# Patient Record
Sex: Female | Born: 1960 | Hispanic: No | Marital: Single | Smoking: Never smoker
Health system: Southern US, Community
[De-identification: ages and names within clinical notes are randomized; demographics above are authoritative.]

## PROBLEM LIST (undated history)

## (undated) DIAGNOSIS — E119 Type 2 diabetes mellitus without complications: Secondary | ICD-10-CM

## (undated) DIAGNOSIS — I10 Essential (primary) hypertension: Secondary | ICD-10-CM

## (undated) DIAGNOSIS — M1990 Unspecified osteoarthritis, unspecified site: Secondary | ICD-10-CM

## (undated) DIAGNOSIS — M797 Fibromyalgia: Secondary | ICD-10-CM

## (undated) HISTORY — PX: TONSILLECTOMY: SUR1361

## (undated) HISTORY — PX: MYOMECTOMY: SHX85

---

## 1998-10-31 HISTORY — PX: ABDOMINAL HYSTERECTOMY: SHX81

## 1999-08-23 ENCOUNTER — Other Ambulatory Visit: Admission: RE | Admit: 1999-08-23 | Discharge: 1999-08-23 | Payer: Self-pay | Admitting: *Deleted

## 1999-08-27 ENCOUNTER — Ambulatory Visit (HOSPITAL_COMMUNITY): Admission: RE | Admit: 1999-08-27 | Discharge: 1999-08-27 | Payer: Self-pay | Admitting: *Deleted

## 1999-08-27 ENCOUNTER — Encounter: Payer: Self-pay | Admitting: *Deleted

## 1999-09-01 ENCOUNTER — Ambulatory Visit: Admission: RE | Admit: 1999-09-01 | Discharge: 1999-09-01 | Payer: Self-pay | Admitting: Gynecology

## 1999-09-02 ENCOUNTER — Encounter: Payer: Self-pay | Admitting: *Deleted

## 1999-09-07 ENCOUNTER — Inpatient Hospital Stay (HOSPITAL_COMMUNITY): Admission: RE | Admit: 1999-09-07 | Discharge: 1999-09-11 | Payer: Self-pay | Admitting: *Deleted

## 1999-09-07 ENCOUNTER — Encounter (INDEPENDENT_AMBULATORY_CARE_PROVIDER_SITE_OTHER): Payer: Self-pay

## 2000-04-14 ENCOUNTER — Encounter: Admission: RE | Admit: 2000-04-14 | Discharge: 2000-04-14 | Payer: Self-pay | Admitting: Orthopedic Surgery

## 2000-04-14 ENCOUNTER — Encounter: Payer: Self-pay | Admitting: Orthopedic Surgery

## 2000-09-19 ENCOUNTER — Other Ambulatory Visit: Admission: RE | Admit: 2000-09-19 | Discharge: 2000-09-19 | Payer: Self-pay | Admitting: *Deleted

## 2000-10-03 ENCOUNTER — Encounter: Admission: RE | Admit: 2000-10-03 | Discharge: 2000-10-03 | Payer: Self-pay | Admitting: *Deleted

## 2000-10-03 ENCOUNTER — Encounter: Payer: Self-pay | Admitting: *Deleted

## 2000-12-25 ENCOUNTER — Encounter: Payer: Self-pay | Admitting: Gastroenterology

## 2000-12-25 ENCOUNTER — Encounter: Admission: RE | Admit: 2000-12-25 | Discharge: 2000-12-25 | Payer: Self-pay | Admitting: Gastroenterology

## 2001-11-06 ENCOUNTER — Encounter: Admission: RE | Admit: 2001-11-06 | Discharge: 2001-11-06 | Payer: Self-pay | Admitting: *Deleted

## 2001-11-06 ENCOUNTER — Encounter: Payer: Self-pay | Admitting: *Deleted

## 2003-11-04 ENCOUNTER — Ambulatory Visit (HOSPITAL_COMMUNITY): Admission: RE | Admit: 2003-11-04 | Discharge: 2003-11-04 | Payer: Self-pay | Admitting: Obstetrics & Gynecology

## 2005-01-12 ENCOUNTER — Ambulatory Visit: Payer: Self-pay | Admitting: Family Medicine

## 2005-01-25 ENCOUNTER — Other Ambulatory Visit: Admission: RE | Admit: 2005-01-25 | Discharge: 2005-01-25 | Payer: Self-pay | Admitting: Obstetrics and Gynecology

## 2005-02-15 ENCOUNTER — Ambulatory Visit (HOSPITAL_COMMUNITY): Admission: RE | Admit: 2005-02-15 | Discharge: 2005-02-15 | Payer: Self-pay | Admitting: Obstetrics and Gynecology

## 2005-04-26 ENCOUNTER — Ambulatory Visit: Payer: Self-pay | Admitting: Family Medicine

## 2005-07-28 ENCOUNTER — Ambulatory Visit: Payer: Self-pay | Admitting: Family Medicine

## 2005-08-23 ENCOUNTER — Encounter: Admission: RE | Admit: 2005-08-23 | Discharge: 2005-08-23 | Payer: Self-pay | Admitting: Family Medicine

## 2005-09-07 ENCOUNTER — Ambulatory Visit: Payer: Self-pay | Admitting: Family Medicine

## 2005-10-18 ENCOUNTER — Ambulatory Visit: Payer: Self-pay | Admitting: Family Medicine

## 2005-10-18 ENCOUNTER — Encounter: Admission: RE | Admit: 2005-10-18 | Discharge: 2005-10-18 | Payer: Self-pay | Admitting: Family Medicine

## 2006-01-31 ENCOUNTER — Other Ambulatory Visit: Admission: RE | Admit: 2006-01-31 | Discharge: 2006-01-31 | Payer: Self-pay | Admitting: Obstetrics and Gynecology

## 2006-02-16 ENCOUNTER — Ambulatory Visit (HOSPITAL_COMMUNITY): Admission: RE | Admit: 2006-02-16 | Discharge: 2006-02-16 | Payer: Self-pay | Admitting: Obstetrics and Gynecology

## 2006-02-28 ENCOUNTER — Ambulatory Visit: Payer: Self-pay | Admitting: Family Medicine

## 2007-03-14 ENCOUNTER — Telehealth: Payer: Self-pay | Admitting: Family Medicine

## 2007-03-14 DIAGNOSIS — R1011 Right upper quadrant pain: Secondary | ICD-10-CM | POA: Insufficient documentation

## 2007-03-16 ENCOUNTER — Encounter: Admission: RE | Admit: 2007-03-16 | Discharge: 2007-03-16 | Payer: Self-pay | Admitting: Family Medicine

## 2007-11-06 ENCOUNTER — Encounter: Payer: Self-pay | Admitting: Family Medicine

## 2008-03-27 ENCOUNTER — Encounter: Payer: Self-pay | Admitting: Family Medicine

## 2008-11-05 ENCOUNTER — Encounter: Payer: Self-pay | Admitting: Family Medicine

## 2009-03-18 ENCOUNTER — Encounter: Payer: Self-pay | Admitting: Family Medicine

## 2009-09-16 ENCOUNTER — Encounter: Payer: Self-pay | Admitting: Family Medicine

## 2009-10-19 ENCOUNTER — Encounter: Admission: RE | Admit: 2009-10-19 | Discharge: 2009-10-19 | Payer: Self-pay | Admitting: Orthopedic Surgery

## 2010-11-30 NOTE — Consult Note (Signed)
Summary: [facility]/Consultation Report/Dr. Corliss Skains  [facility]/Consultation Report/Dr. Corliss Skains   Imported By: Mickle Asper 04/03/2008 12:18:27  _____________________________________________________________________  External Attachment:    Type:   Image     Comment:   External Document

## 2014-11-12 NOTE — H&P (Signed)
CHIEF COMPLAINT:  Painful left knee.    HISTORY OF PRESENT ILLNESS:  Marie [name] is a very pleasant 54 year old female who is seen today for evaluation of her left knee.  She has been treated for her osteoarthritis by Dr. Corliss Skains and Melina Copa, PA.  She has continued to have pain and discomfort in the knee to the point where she has pain with every step, as well as nighttime pain.  She does have some swelling at times.  She is getting some catching and locking and giving way symptoms.  She has been tried on anti-inflammatories of Celebrex, which has not helped her knees, but certainly has been helpful to her pain.  She has had corticosteroid injections, which are now becoming refractory.  She has also had viscosupplementation, which unfortunately has only been helpful for about 3-4 months.  She is now to the point where she is having difficulty with ambulation and her activities of daily living.  She comes in today for evaluation of the left knee.     CURRENT MEDICATIONS:   1.  Tramadol 50 mg 1-2 per day. 2.  Celebrex 200 mg daily. 3.  Losartan/hydrochlorothiazide 100/25, 0.5 tablet daily.   4.  Simvastatin 40 mg at bedtime. 5.  Estradiol 1 mg daily. 6.  Lidoderm patch 5% p.r.n. 7.  Fish oil 2 per day. 8.  Aspirin 81 mg daily. 9.  Victoza injection 18 mg and 3 mL once daily.   ALLERGIES:  NO KNOWN DRUG ALLERGIES.    SURGICAL HISTORY:   1.  Fibroid cystectomy in 1988 from the uterus, she has had 3 surgical procedures for this.   2.  Hysterectomy with salpingo-oophorectomy.  She also had childbirth in 1985.   REVIEW OF SYSTEMS:  A 14-point is negative except for glasses.  She also has had hypertension for 2-3 years and is on Losartan/hydrochlorothiazide.  Diabetes.  She stated she that she is a borderline diabetic.  She is on Victoza and what they are trying to do is also do this for weight loss.   FAMILY HISTORY:  Positive for a mother who just died of leukemia at age 62.  She has had bone  cancer previously.  History of hypertension, diabetes.  Father who is 33 years old and alive, but does have hypertension, diabetes.  She has 1 brother who is healthy at age 22.  No sisters.   SOCIAL HISTORY:   She is a 54 year old female, [place], who is married.  She works from [employer].  She denies the use of tobacco or alcohol.     PHYSICAL EXAMINATION:  Today reveals a 54 year old [place], well-developed, well-nourished, alert, pleasant, cooperative, in moderate distress secondary to left knee pain.  Height is 67.75 inches, weight 294 pounds, BMI 45.  Temperature is 98.6, pulse 76, respiration is 16, blood pressure 126/76.  Head is normocephalic.  Eyes:  Pupils equal, round, reactive to light and accommodation with extraocular movement intact.  Ears, nose and throat were benign.  Neck was supple, no bruits were noted.  Chest had good expansion.  Lungs were essentially clear.  Cardiac had regular rhythm and rate with distant heart sounds.  No murmur is noted.  Pulses were 1+ bilateral and symmetric in lower extremity.  Abdomen with scaphoid, obese, soft, nontender, no masses palpable.  Normal bowel sounds present.  Genital, rectal, and breast exam not indicated for an orthopedic evaluation.  Musculoskeletal:  She has range of motion from approximately 3 degrees to 105 degrees.  She  does have some pseudolaxity with varus and valgus stressing.  She does have patellofemoral crepitus with range of motion, as well as tibiofemoral crepitance.  Calf is soft and nontender.  She is neurovascularly intact distally.     RADIOGRAPHS:  X-rays reveal medial compartment narrowing, significantly medially.  She has periarticular spurring more medial than lateral and some translation of the femur medial on the tibia.  She does have possible loose body posterolaterally.  She does have patellofemoral OA.     CLINICAL IMPRESSION:   1.  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   2.  History of  diabetes. 3.  History of hypertension. 4.  Exogenous obesity with BMI of 45.     RECOMMENDATIONS:  At this time we are going to consider possible total joint replacement.  I have reviewed a clearance form from Dr. Clelia CroftShaw who feels that she is a candidate from both a medical and cardiac standpoint for surgical intervention.  Therefore our plan is to proceed with a left total knee arthroplasty.  The procedure, risks, and benefits were fully explained to her in detail using appropriate models.  I have answered all of her questions in detail and gave her what the hospital and postoperative course.  She is totally understanding.  Plan for surgical intervention in the near future.  Oris DroneBrian D. Aleda Granaetrarca, PA-C [facility] [phone]  11/12/2014 4:28 PM

## 2014-11-14 ENCOUNTER — Encounter (HOSPITAL_COMMUNITY): Payer: Self-pay

## 2014-11-14 ENCOUNTER — Ambulatory Visit (HOSPITAL_COMMUNITY)
Admission: RE | Admit: 2014-11-14 | Discharge: 2014-11-14 | Disposition: A | Discharge status: Home / Self Care | Payer: 59 | Source: Ambulatory Visit | Attending: Orthopedic Surgery | Admitting: Orthopedic Surgery

## 2014-11-14 ENCOUNTER — Encounter (HOSPITAL_COMMUNITY)
Admission: RE | Admit: 2014-11-14 | Discharge: 2014-11-14 | Disposition: A | Discharge status: Home / Self Care | Payer: 59 | Source: Ambulatory Visit | Attending: Orthopaedic Surgery | Admitting: Orthopaedic Surgery

## 2014-11-14 ENCOUNTER — Other Ambulatory Visit: Payer: Self-pay

## 2014-11-14 DIAGNOSIS — I10 Essential (primary) hypertension: Secondary | ICD-10-CM | POA: Insufficient documentation

## 2014-11-14 DIAGNOSIS — E119 Type 2 diabetes mellitus without complications: Secondary | ICD-10-CM | POA: Diagnosis not present

## 2014-11-14 DIAGNOSIS — M797 Fibromyalgia: Secondary | ICD-10-CM | POA: Diagnosis not present

## 2014-11-14 DIAGNOSIS — Z01818 Encounter for other preprocedural examination: Secondary | ICD-10-CM | POA: Diagnosis not present

## 2014-11-14 HISTORY — DX: Fibromyalgia: M79.7

## 2014-11-14 HISTORY — DX: Unspecified osteoarthritis, unspecified site: M19.90

## 2014-11-14 HISTORY — DX: Type 2 diabetes mellitus without complications: E11.9

## 2014-11-14 LAB — COMPREHENSIVE METABOLIC PANEL
ALK PHOS: 55 U/L (ref 39–117)
ALT: 14 U/L (ref 0–35)
ANION GAP: 4 — AB (ref 5–15)
AST: 18 U/L (ref 0–37)
Albumin: 3.9 g/dL (ref 3.5–5.2)
BUN: 11 mg/dL (ref 6–23)
CHLORIDE: 107 meq/L (ref 96–112)
CO2: 28 mmol/L (ref 19–32)
Calcium: 9.5 mg/dL (ref 8.4–10.5)
Creatinine, Ser: 0.74 mg/dL (ref 0.50–1.10)
GFR calc Af Amer: >90 mL/min (ref >90)
GFR calc non Af Amer: >90 mL/min (ref >90)
GLUCOSE: 96 mg/dL (ref 70–99)
POTASSIUM: 3.9 mmol/L (ref 3.5–5.1)
SODIUM: 139 mmol/L (ref 135–145)
Total Bilirubin: 0.3 mg/dL (ref 0.3–1.2)
Total Protein: 7 g/dL (ref 6.0–8.3)

## 2014-11-14 LAB — URINALYSIS, ROUTINE W REFLEX MICROSCOPIC
Bilirubin Urine: NEGATIVE
GLUCOSE, UA: NEGATIVE mg/dL
Hgb urine dipstick: NEGATIVE
Ketones, ur: NEGATIVE mg/dL
LEUKOCYTES UA: NEGATIVE
Nitrite: NEGATIVE
Protein, ur: NEGATIVE mg/dL
SPECIFIC GRAVITY, URINE: 1.009 (ref 1.005–1.030)
UROBILINOGEN UA: 1 mg/dL (ref 0.0–1.0)
pH: 7.5 (ref 5.0–8.0)

## 2014-11-14 LAB — PROTIME-INR
INR: 0.96 (ref 0.00–1.49)
Prothrombin Time: 12.9 seconds (ref 11.6–15.2)

## 2014-11-14 LAB — CBC WITH DIFFERENTIAL/PLATELET
BASOS ABS: 0 10*3/uL (ref 0.0–0.1)
Basophils Relative: 1 % (ref 0–1)
Eosinophils Absolute: 0.3 10*3/uL (ref 0.0–0.7)
Eosinophils Relative: 4 % (ref 0–5)
HEMATOCRIT: 41.5 % (ref 36.0–46.0)
Hemoglobin: 13.5 g/dL (ref 12.0–15.0)
Lymphocytes Relative: 41 % (ref 12–46)
Lymphs Abs: 3.6 10*3/uL (ref 0.7–4.0)
MCH: 27.3 pg (ref 26.0–34.0)
MCHC: 32.5 g/dL (ref 30.0–36.0)
MCV: 84 fL (ref 78.0–100.0)
MONO ABS: 0.5 10*3/uL (ref 0.1–1.0)
Monocytes Relative: 5 % (ref 3–12)
NEUTROS PCT: 49 % (ref 43–77)
Neutro Abs: 4.3 10*3/uL (ref 1.7–7.7)
Platelets: 246 10*3/uL (ref 150–400)
RBC: 4.94 MIL/uL (ref 3.87–5.11)
RDW: 13.2 % (ref 11.5–15.5)
WBC: 8.7 10*3/uL (ref 4.0–10.5)

## 2014-11-14 LAB — SURGICAL PCR SCREEN
MRSA, PCR: NEGATIVE
Staphylococcus aureus: NEGATIVE

## 2014-11-14 LAB — TYPE AND SCREEN
ABO/RH(D): AB POS
Antibody Screen: NEGATIVE

## 2014-11-14 LAB — ABO/RH: ABO/RH(D): AB POS

## 2014-11-14 LAB — APTT: aPTT: 27 seconds (ref 24–37)

## 2014-11-14 NOTE — Progress Notes (Signed)
   11/14/14 1553  OBSTRUCTIVE SLEEP APNEA  Have you ever been diagnosed with sleep apnea through a sleep study? No  Do you snore loudly (loud enough to be heard through closed doors)?  0  Do you often feel tired, fatigued, or sleepy during the daytime? 1  Has anyone observed you stop breathing during your sleep? 0  Do you have, or are you being treated for high blood pressure? 1  BMI more than 35 kg/m2? 1  Age over 55 years old? 1  Neck circumference greater than 40 cm/16 inches? 1  Gender: 0  Obstructive Sleep Apnea Score 5  Score 4 or greater  Results sent to PCP

## 2014-11-14 NOTE — Progress Notes (Signed)
Pt. Reports that she hasn't ever been seen by cardiologist, denies ever having an advanced cardiac review.  Unsure of last ekg, where or when.  Pt. Denies changes or concerns in her chest for heart or lungs. Pt. Followed by By Dr. Pincus BadderK. Shaw.  Uses Victoza for weight loss, states she is a borderline diabetic.

## 2014-11-14 NOTE — Pre-Procedure Instructions (Signed)
Marie GrossKathy O [name]  11/14/2014   Your procedure is scheduled on:  11/25/2014  [facility] Ad[facility]   ENTRANCE A at  5:30  AM.  Call this number if you have problems the morning of surgery: [phone]   Remember:   Do not eat food or drink liquids after midnight.  On MONDAY   Take these medicines the morning of surgery with A SIP OF WATER:  NONE   Do not wear jewelry, make-up or nail polish.   Do not wear lotions, powders, or perfumes. You may wear deodorant.   Do not shave 48 hours prior to surgery.    Do not bring valuables to the hospital.  [facility] not [facility]                  for any belongings or valuables.               Contacts, dentures or bridgework may not be worn into surgery.   Leave suitcase in the car. After surgery it may be brought to your room.   For patients admitted to the hospital, discharge time is determined by your                treatment team.               Patients discharged the day of surgery will not be allowed to drive  home.  Name and phone number of your driver: with family  Special Instructions: Special Instructions: [facility] - Preparing for Surgery  Before surgery, you can play an important role.  Because skin is not sterile, your skin needs to be as free of germs as possible.  You can reduce the number of germs on you skin by washing with CHG (chlorahexidine gluconate) soap before surgery.  CHG is an antiseptic cleaner which kills germs and bonds with the skin to continue killing germs even after washing.  Please DO NOT use if you have an allergy to CHG or antibacterial soaps.  If your skin becomes reddened/irritated stop using the CHG and inform your nurse when you arrive at Short Stay.  Do not shave (including legs and underarms) for at least 48 hours prior to the first CHG shower.  You may shave your face.  Please follow these instructions carefully:   1.  Shower with CHG Soap the night before surgery and  the  morning of Surgery.  2.  If you choose to wash your hair, wash your hair first as usual with your  normal shampoo.  3.  After you shampoo, rinse your hair and body thoroughly to remove the  Shampoo.  4.  Use CHG as you would any other liquid soap.  You can apply chg directly to the skin and wash gently with scrungie or a clean washcloth.  5.  Apply the CHG Soap to your body ONLY FROM THE NECK DOWN.    Do not use on open wounds or open sores.  Avoid contact with your eyes, ears, mouth and genitals (private parts).  Wash genitals (private parts)   with your normal soap.  6.  Wash thoroughly, paying special attention to the area where your surgery will be performed.  7.  Thoroughly rinse your body with warm water from the neck down.  8.  DO NOT shower/wash with your normal soap after using and rinsing off   the CHG Soap.  9.  Pat yourself dry with a clean towel.  10.  Wear clean pajamas.            11.  Place clean sheets on your bed the night of your first shower and do not sleep with pets.  Day of Surgery  Do not apply any lotions/deodorants the morning of surgery.  Please wear clean clothes to the hospital/surgery center.   Please read over the following fact sheets that you were given: Pain Booklet, Coughing and Deep Breathing, MRSA Information and Surgical Site Infection Prevention

## 2014-11-14 NOTE — Progress Notes (Signed)
Ph. Tech into see pt. For med. Rec. Profile.

## 2014-11-16 LAB — URINE CULTURE: Colony Count: 50000

## 2014-11-17 NOTE — Progress Notes (Signed)
Anesthesia Chart Review:   Marie [name] is 54 year old female scheduled for L total knee arthroplasty on 11/25/2014 with Dr. Cleophas DunkerWhitfield.   PMH includes: DM. BMI 48.   Preoperative labs reviewed.    Chest x-ray reviewed. No acute abnormalities.   EKG: NSR. Cannot rule out Anterior infarct , age undetermined. No prior tracing for comparison.   Called and spoke with Marie [name]. She denies ever experiencing chest pain. Denies SOB, CP with activity although activity is currently limited by knee pain. Denies having EKG in the past.   If no changes, I anticipate Marie [name] can proceed with surgery as scheduled.   Rica Mastngela Kyros Salzwedel, FNP-BC [facility]Anesthesiology Phone: [phone] 11/18/2014 3:03 PM

## 2014-11-18 ENCOUNTER — Encounter (HOSPITAL_COMMUNITY): Payer: Self-pay

## 2014-11-24 MED ORDER — CHLORHEXIDINE GLUCONATE 4 % EX LIQD
60.0000 mL | Freq: Once | CUTANEOUS | Status: DC
  Filled 2014-11-24: qty 60

## 2014-11-24 MED ORDER — ACETAMINOPHEN 10 MG/ML IV SOLN
1000.0000 mg | Freq: Once | INTRAVENOUS | Status: DC
  Filled 2014-11-24: qty 100

## 2014-11-24 MED ORDER — SODIUM CHLORIDE 0.9 % IV SOLN: INTRAVENOUS | Status: DC

## 2014-11-24 MED ORDER — DEXTROSE 5 % IV SOLN
3.0000 g | INTRAVENOUS | Status: DC
  Filled 2014-11-24: qty 3000

## 2014-11-24 MED ORDER — ACETAMINOPHEN 10 MG/ML IV SOLN
1000.0000 mg | Freq: Four times a day (QID) | INTRAVENOUS | Status: DC
  Administered 2014-11-25: 1000 mg via INTRAVENOUS

## 2014-11-24 MED ORDER — DEXTROSE 5 % IV SOLN
3.0000 g | INTRAVENOUS | Status: AC
  Administered 2014-11-25: 3 g via INTRAVENOUS
  Filled 2014-11-24: qty 3000

## 2014-11-25 ENCOUNTER — Inpatient Hospital Stay (HOSPITAL_COMMUNITY)
Admission: RE | Admit: 2014-11-25 | Discharge: 2014-11-27 | DRG: 470 | Disposition: A | Discharge status: Home Health | Payer: 59 | Source: Ambulatory Visit | Attending: Orthopaedic Surgery | Admitting: Orthopaedic Surgery

## 2014-11-25 ENCOUNTER — Inpatient Hospital Stay (HOSPITAL_COMMUNITY): Payer: 59 | Admitting: Emergency Medicine

## 2014-11-25 ENCOUNTER — Encounter (HOSPITAL_COMMUNITY): Payer: Self-pay | Admitting: Anesthesiology

## 2014-11-25 ENCOUNTER — Encounter (HOSPITAL_COMMUNITY)
Admission: RE | Disposition: A | Discharge status: Home Health | Payer: Self-pay | Source: Ambulatory Visit | Attending: Orthopaedic Surgery

## 2014-11-25 ENCOUNTER — Inpatient Hospital Stay (HOSPITAL_COMMUNITY): Payer: 59 | Admitting: Anesthesiology

## 2014-11-25 DIAGNOSIS — Z8249 Family history of ischemic heart disease and other diseases of the circulatory system: Secondary | ICD-10-CM

## 2014-11-25 DIAGNOSIS — E119 Type 2 diabetes mellitus without complications: Secondary | ICD-10-CM | POA: Diagnosis present

## 2014-11-25 DIAGNOSIS — Z79899 Other long term (current) drug therapy: Secondary | ICD-10-CM | POA: Diagnosis not present

## 2014-11-25 DIAGNOSIS — Z01818 Encounter for other preprocedural examination: Secondary | ICD-10-CM

## 2014-11-25 DIAGNOSIS — M25562 Pain in left knee: Secondary | ICD-10-CM | POA: Diagnosis present

## 2014-11-25 DIAGNOSIS — I10 Essential (primary) hypertension: Secondary | ICD-10-CM | POA: Diagnosis present

## 2014-11-25 DIAGNOSIS — M797 Fibromyalgia: Secondary | ICD-10-CM | POA: Diagnosis present

## 2014-11-25 DIAGNOSIS — Z7901 Long term (current) use of anticoagulants: Secondary | ICD-10-CM

## 2014-11-25 DIAGNOSIS — E1169 Type 2 diabetes mellitus with other specified complication: Secondary | ICD-10-CM

## 2014-11-25 DIAGNOSIS — Z96659 Presence of unspecified artificial knee joint: Secondary | ICD-10-CM

## 2014-11-25 DIAGNOSIS — E669 Obesity, unspecified: Secondary | ICD-10-CM

## 2014-11-25 DIAGNOSIS — Z833 Family history of diabetes mellitus: Secondary | ICD-10-CM | POA: Diagnosis not present

## 2014-11-25 DIAGNOSIS — Z806 Family history of leukemia: Secondary | ICD-10-CM

## 2014-11-25 DIAGNOSIS — M1712 Unilateral primary osteoarthritis, left knee: Principal | ICD-10-CM | POA: Diagnosis present

## 2014-11-25 DIAGNOSIS — Z6841 Body Mass Index (BMI) 40.0 and over, adult: Secondary | ICD-10-CM | POA: Diagnosis not present

## 2014-11-25 HISTORY — PX: TOTAL KNEE ARTHROPLASTY: SHX125

## 2014-11-25 HISTORY — DX: Essential (primary) hypertension: I10

## 2014-11-25 LAB — GLUCOSE, CAPILLARY
GLUCOSE-CAPILLARY: 133 mg/dL — AB (ref 70–99)
Glucose-Capillary: 124 mg/dL — ABNORMAL HIGH (ref 70–99)
Glucose-Capillary: 126 mg/dL — ABNORMAL HIGH (ref 70–99)

## 2014-11-25 LAB — HEMOGLOBIN A1C
Hgb A1c MFr Bld: 6.2 % — ABNORMAL HIGH (ref <5.7)
Mean Plasma Glucose: 131 mg/dL — ABNORMAL HIGH (ref <117)

## 2014-11-25 SURGERY — ARTHROPLASTY, KNEE, TOTAL
Anesthesia: General | Site: Knee | Laterality: Left

## 2014-11-25 MED ORDER — ONDANSETRON HCL 4 MG/2ML IJ SOLN
INTRAMUSCULAR | Status: AC
  Filled 2014-11-25: qty 2

## 2014-11-25 MED ORDER — ONDANSETRON HCL 4 MG/2ML IJ SOLN
INTRAMUSCULAR | Status: DC | PRN
  Administered 2014-11-25: 4 mg via INTRAVENOUS

## 2014-11-25 MED ORDER — METHOCARBAMOL 1000 MG/10ML IJ SOLN
500.0000 mg | Freq: Four times a day (QID) | INTRAVENOUS | Status: DC | PRN
  Filled 2014-11-25: qty 5

## 2014-11-25 MED ORDER — ACETAMINOPHEN 10 MG/ML IV SOLN
1000.0000 mg | Freq: Four times a day (QID) | INTRAVENOUS | Status: AC
  Administered 2014-11-25 – 2014-11-26 (×4): 1000 mg via INTRAVENOUS
  Filled 2014-11-25 (×6): qty 100

## 2014-11-25 MED ORDER — VANCOMYCIN HCL 1000 MG IV SOLR
INTRAVENOUS | Status: DC | PRN
  Administered 2014-11-25: 1000 mg

## 2014-11-25 MED ORDER — MENTHOL 3 MG MT LOZG: 1.0000 | LOZENGE | OROMUCOSAL | Status: DC | PRN

## 2014-11-25 MED ORDER — SODIUM CHLORIDE 0.9 % IV SOLN
INTRAVENOUS | Status: DC
  Administered 2014-11-25: 13:00:00 via INTRAVENOUS

## 2014-11-25 MED ORDER — SODIUM CHLORIDE 0.9 % IR SOLN
Status: DC | PRN
  Administered 2014-11-25 (×2): 1000 mL

## 2014-11-25 MED ORDER — HYDROMORPHONE HCL 1 MG/ML IJ SOLN
0.5000 mg | INTRAMUSCULAR | Status: DC | PRN
  Administered 2014-11-25 – 2014-11-27 (×4): 1 mg via INTRAVENOUS
  Filled 2014-11-25 (×4): qty 1

## 2014-11-25 MED ORDER — BUPIVACAINE-EPINEPHRINE 0.25% -1:200000 IJ SOLN
INTRAMUSCULAR | Status: DC | PRN
  Administered 2014-11-25: 30 mL

## 2014-11-25 MED ORDER — INSULIN ASPART 100 UNIT/ML ~~LOC~~ SOLN
0.0000 [IU] | Freq: Three times a day (TID) | SUBCUTANEOUS | Status: DC
  Administered 2014-11-25: 2 [IU] via SUBCUTANEOUS
  Administered 2014-11-26 (×3): 3 [IU] via SUBCUTANEOUS
  Administered 2014-11-27: 2 [IU] via SUBCUTANEOUS

## 2014-11-25 MED ORDER — ESTRADIOL 1 MG PO TABS
1.0000 mg | ORAL_TABLET | Freq: Every day | ORAL | Status: DC
  Administered 2014-11-25 – 2014-11-26 (×2): 1 mg via ORAL
  Filled 2014-11-25 (×3): qty 1

## 2014-11-25 MED ORDER — DEXTROSE 5 % IV SOLN
INTRAVENOUS | Status: DC | PRN
  Administered 2014-11-25: 08:00:00 via INTRAVENOUS

## 2014-11-25 MED ORDER — ONDANSETRON HCL 4 MG/2ML IJ SOLN
4.0000 mg | Freq: Once | INTRAMUSCULAR | Status: AC | PRN
  Administered 2014-11-25: 4 mg via INTRAVENOUS

## 2014-11-25 MED ORDER — LOSARTAN POTASSIUM 50 MG PO TABS
50.0000 mg | ORAL_TABLET | Freq: Every day | ORAL | Status: DC
  Administered 2014-11-25 – 2014-11-26 (×2): 50 mg via ORAL
  Filled 2014-11-25 (×3): qty 1

## 2014-11-25 MED ORDER — METHOCARBAMOL 500 MG PO TABS
500.0000 mg | ORAL_TABLET | Freq: Four times a day (QID) | ORAL | Status: DC | PRN
Start: 2014-11-25 — End: 2014-11-27
  Administered 2014-11-25 – 2014-11-27 (×8): 500 mg via ORAL
  Filled 2014-11-25 (×9): qty 1

## 2014-11-25 MED ORDER — SIMVASTATIN 40 MG PO TABS
40.0000 mg | ORAL_TABLET | Freq: Every day | ORAL | Status: DC
  Administered 2014-11-25 – 2014-11-26 (×2): 40 mg via ORAL
  Filled 2014-11-25 (×4): qty 1

## 2014-11-25 MED ORDER — INSULIN ASPART 100 UNIT/ML ~~LOC~~ SOLN: 0.0000 [IU] | Freq: Every day | SUBCUTANEOUS | Status: DC

## 2014-11-25 MED ORDER — LIDOCAINE HCL (CARDIAC) 20 MG/ML IV SOLN
INTRAVENOUS | Status: AC
  Filled 2014-11-25: qty 5

## 2014-11-25 MED ORDER — BISACODYL 5 MG PO TBEC: 5.0000 mg | DELAYED_RELEASE_TABLET | Freq: Every day | ORAL | Status: DC | PRN

## 2014-11-25 MED ORDER — PHENOL 1.4 % MT LIQD
1.0000 | OROMUCOSAL | Status: DC | PRN
Start: 2014-11-25 — End: 2014-11-27

## 2014-11-25 MED ORDER — LACTATED RINGERS IV SOLN
INTRAVENOUS | Status: DC | PRN
  Administered 2014-11-25 (×2): via INTRAVENOUS

## 2014-11-25 MED ORDER — PROPOFOL 10 MG/ML IV BOLUS
INTRAVENOUS | Status: AC
  Filled 2014-11-25: qty 20

## 2014-11-25 MED ORDER — LOSARTAN POTASSIUM-HCTZ 100-25 MG PO TABS: 0.5000 | ORAL_TABLET | Freq: Every day | ORAL | Status: DC

## 2014-11-25 MED ORDER — DOCUSATE SODIUM 100 MG PO CAPS
100.0000 mg | ORAL_CAPSULE | Freq: Two times a day (BID) | ORAL | Status: DC
  Administered 2014-11-25 – 2014-11-27 (×5): 100 mg via ORAL
  Filled 2014-11-25 (×6): qty 1

## 2014-11-25 MED ORDER — MAGNESIUM CITRATE PO SOLN: 1.0000 | Freq: Once | ORAL | Status: AC | PRN

## 2014-11-25 MED ORDER — LIRAGLUTIDE 18 MG/3ML ~~LOC~~ SOPN: 1.8000 mg | PEN_INJECTOR | Freq: Every day | SUBCUTANEOUS | Status: DC

## 2014-11-25 MED ORDER — MIDAZOLAM HCL 2 MG/2ML IJ SOLN
INTRAMUSCULAR | Status: AC
  Filled 2014-11-25: qty 2

## 2014-11-25 MED ORDER — METHOCARBAMOL 500 MG PO TABS
ORAL_TABLET | ORAL | Status: AC
  Administered 2014-11-25: 500 mg via ORAL
  Filled 2014-11-25: qty 1

## 2014-11-25 MED ORDER — FENTANYL CITRATE 0.05 MG/ML IJ SOLN
INTRAMUSCULAR | Status: DC | PRN
  Administered 2014-11-25 (×5): 25 ug via INTRAVENOUS
  Administered 2014-11-25: 50 ug via INTRAVENOUS
  Administered 2014-11-25: 25 ug via INTRAVENOUS
  Administered 2014-11-25: 50 ug via INTRAVENOUS

## 2014-11-25 MED ORDER — OXYCODONE HCL 5 MG PO TABS
5.0000 mg | ORAL_TABLET | ORAL | Status: DC | PRN
  Administered 2014-11-25 – 2014-11-27 (×14): 10 mg via ORAL
  Filled 2014-11-25 (×13): qty 2

## 2014-11-25 MED ORDER — ONDANSETRON HCL 4 MG/2ML IJ SOLN
INTRAMUSCULAR | Status: AC
  Administered 2014-11-25: 4 mg via INTRAVENOUS
  Filled 2014-11-25: qty 2

## 2014-11-25 MED ORDER — ALUM & MAG HYDROXIDE-SIMETH 200-200-20 MG/5ML PO SUSP: 30.0000 mL | ORAL | Status: DC | PRN

## 2014-11-25 MED ORDER — ONDANSETRON HCL 4 MG PO TABS: 4.0000 mg | ORAL_TABLET | Freq: Four times a day (QID) | ORAL | Status: DC | PRN

## 2014-11-25 MED ORDER — METOCLOPRAMIDE HCL 5 MG/ML IJ SOLN: 5.0000 mg | Freq: Three times a day (TID) | INTRAMUSCULAR | Status: DC | PRN

## 2014-11-25 MED ORDER — MIDAZOLAM HCL 5 MG/5ML IJ SOLN
INTRAMUSCULAR | Status: DC | PRN
  Administered 2014-11-25: 2 mg via INTRAVENOUS

## 2014-11-25 MED ORDER — HYDROMORPHONE HCL 1 MG/ML IJ SOLN
INTRAMUSCULAR | Status: AC
Start: 2014-11-25 — End: 2014-11-25
  Administered 2014-11-25: 0.5 mg via INTRAVENOUS
  Filled 2014-11-25: qty 1

## 2014-11-25 MED ORDER — RIVAROXABAN 10 MG PO TABS
10.0000 mg | ORAL_TABLET | Freq: Every day | ORAL | Status: DC
  Administered 2014-11-26 – 2014-11-27 (×2): 10 mg via ORAL
  Filled 2014-11-25 (×3): qty 1

## 2014-11-25 MED ORDER — HYDROMORPHONE HCL 1 MG/ML IJ SOLN
0.2500 mg | INTRAMUSCULAR | Status: DC | PRN
  Administered 2014-11-25 (×6): 0.5 mg via INTRAVENOUS

## 2014-11-25 MED ORDER — CEFAZOLIN SODIUM-DEXTROSE 2-3 GM-% IV SOLR
2.0000 g | Freq: Four times a day (QID) | INTRAVENOUS | Status: AC
  Administered 2014-11-25 (×2): 2 g via INTRAVENOUS
  Filled 2014-11-25 (×3): qty 50

## 2014-11-25 MED ORDER — OXYCODONE HCL 5 MG PO TABS
ORAL_TABLET | ORAL | Status: AC
  Administered 2014-11-25: 10 mg via ORAL
  Filled 2014-11-25: qty 2

## 2014-11-25 MED ORDER — METOCLOPRAMIDE HCL 10 MG PO TABS: 5.0000 mg | ORAL_TABLET | Freq: Three times a day (TID) | ORAL | Status: DC | PRN

## 2014-11-25 MED ORDER — HYDROMORPHONE HCL 1 MG/ML IJ SOLN
INTRAMUSCULAR | Status: AC
  Administered 2014-11-25: 0.5 mg via INTRAVENOUS
  Filled 2014-11-25: qty 2

## 2014-11-25 MED ORDER — ONDANSETRON HCL 4 MG/2ML IJ SOLN: 4.0000 mg | Freq: Four times a day (QID) | INTRAMUSCULAR | Status: DC | PRN

## 2014-11-25 MED ORDER — KETOROLAC TROMETHAMINE 15 MG/ML IJ SOLN
INTRAMUSCULAR | Status: AC
  Filled 2014-11-25: qty 1

## 2014-11-25 MED ORDER — MEPERIDINE HCL 25 MG/ML IJ SOLN: 6.2500 mg | INTRAMUSCULAR | Status: DC | PRN

## 2014-11-25 MED ORDER — FENTANYL CITRATE 0.05 MG/ML IJ SOLN
INTRAMUSCULAR | Status: AC
  Filled 2014-11-25: qty 5

## 2014-11-25 MED ORDER — KETOROLAC TROMETHAMINE 15 MG/ML IJ SOLN
15.0000 mg | Freq: Four times a day (QID) | INTRAMUSCULAR | Status: AC
  Administered 2014-11-25 – 2014-11-26 (×3): 15 mg via INTRAVENOUS
  Filled 2014-11-25 (×4): qty 1

## 2014-11-25 MED ORDER — LIDOCAINE HCL (CARDIAC) 20 MG/ML IV SOLN
INTRAVENOUS | Status: DC | PRN
  Administered 2014-11-25: 100 mg via INTRAVENOUS

## 2014-11-25 MED ORDER — VANCOMYCIN HCL 1000 MG IV SOLR
INTRAVENOUS | Status: AC
  Filled 2014-11-25: qty 1000

## 2014-11-25 MED ORDER — SENNOSIDES-DOCUSATE SODIUM 8.6-50 MG PO TABS: 1.0000 | ORAL_TABLET | Freq: Every evening | ORAL | Status: DC | PRN

## 2014-11-25 MED ORDER — BUPIVACAINE-EPINEPHRINE (PF) 0.25% -1:200000 IJ SOLN
INTRAMUSCULAR | Status: AC
  Filled 2014-11-25: qty 30

## 2014-11-25 MED ORDER — HYDROCHLOROTHIAZIDE 12.5 MG PO CAPS
12.5000 mg | ORAL_CAPSULE | Freq: Every day | ORAL | Status: DC
  Administered 2014-11-25 – 2014-11-26 (×2): 12.5 mg via ORAL
  Filled 2014-11-25 (×3): qty 1

## 2014-11-25 MED ORDER — PROPOFOL 10 MG/ML IV BOLUS
INTRAVENOUS | Status: DC | PRN
  Administered 2014-11-25: 200 mg via INTRAVENOUS
  Administered 2014-11-25: 20 mg via INTRAVENOUS

## 2014-11-25 MED ORDER — 0.9 % SODIUM CHLORIDE (POUR BTL) OPTIME
TOPICAL | Status: DC | PRN
  Administered 2014-11-25: 1000 mL

## 2014-11-25 MED ORDER — DIPHENHYDRAMINE HCL 12.5 MG/5ML PO ELIX: 12.5000 mg | ORAL_SOLUTION | ORAL | Status: DC | PRN

## 2014-11-25 SURGICAL SUPPLY — 62 items
BANDAGE ESMARK 6X9 LF (GAUZE/BANDAGES/DRESSINGS) ×1 IMPLANT
BLADE SAGITTAL 25.0X1.19X90 (BLADE) ×2 IMPLANT
BNDG CMPR 9X6 STRL LF SNTH (GAUZE/BANDAGES/DRESSINGS) ×1
BNDG ESMARK 6X9 LF (GAUZE/BANDAGES/DRESSINGS) ×2
BOWL SMART MIX CTS (DISPOSABLE) ×2 IMPLANT
CAP KNEE TOTAL 3 SIGMA ×2 IMPLANT
CEMENT HV SMART SET (Cement) ×4 IMPLANT
COVER SURGICAL LIGHT HANDLE (MISCELLANEOUS) ×2 IMPLANT
CUFF TOURNIQUET SINGLE 34IN LL (TOURNIQUET CUFF) ×2 IMPLANT
CUFF TOURNIQUET SINGLE 44IN (TOURNIQUET CUFF) IMPLANT
DRAPE EXTREMITY T 121X128X90 (DRAPE) ×2 IMPLANT
DRAPE IMP U-DRAPE 54X76 (DRAPES) ×2 IMPLANT
DRAPE PROXIMA HALF (DRAPES) ×2 IMPLANT
DRSG ADAPTIC 3X8 NADH LF (GAUZE/BANDAGES/DRESSINGS) ×2 IMPLANT
DRSG PAD ABDOMINAL 8X10 ST (GAUZE/BANDAGES/DRESSINGS) ×2 IMPLANT
DURAPREP 26ML APPLICATOR (WOUND CARE) ×4 IMPLANT
ELECT CAUTERY BLADE 6.4 (BLADE) ×2 IMPLANT
ELECT REM PT RETURN 9FT ADLT (ELECTROSURGICAL) ×2
ELECTRODE REM PT RTRN 9FT ADLT (ELECTROSURGICAL) ×1 IMPLANT
EVACUATOR 1/8 PVC DRAIN (DRAIN) IMPLANT
FACESHIELD WRAPAROUND (MASK) ×4 IMPLANT
FIXATION PINS ×6 IMPLANT
FLOSEAL 10ML (HEMOSTASIS) IMPLANT
GAUZE SPONGE 4X4 12PLY STRL (GAUZE/BANDAGES/DRESSINGS) ×2 IMPLANT
GLOVE BIOGEL PI IND STRL 8 (GLOVE) ×1 IMPLANT
GLOVE BIOGEL PI IND STRL 8.5 (GLOVE) ×1 IMPLANT
GLOVE BIOGEL PI INDICATOR 8 (GLOVE) ×1
GLOVE BIOGEL PI INDICATOR 8.5 (GLOVE) ×1
GLOVE ECLIPSE 8.0 STRL XLNG CF (GLOVE) ×4 IMPLANT
GLOVE SURG ORTHO 8.5 STRL (GLOVE) ×4 IMPLANT
GOWN STRL REUS W/ TWL LRG LVL3 (GOWN DISPOSABLE) ×2 IMPLANT
GOWN STRL REUS W/TWL 2XL LVL3 (GOWN DISPOSABLE) ×2 IMPLANT
GOWN STRL REUS W/TWL LRG LVL3 (GOWN DISPOSABLE) ×4
HANDPIECE INTERPULSE COAX TIP (DISPOSABLE) ×2
KIT BASIN OR (CUSTOM PROCEDURE TRAY) ×2 IMPLANT
KIT ROOM TURNOVER OR (KITS) ×2 IMPLANT
MANIFOLD NEPTUNE II (INSTRUMENTS) ×2 IMPLANT
NEEDLE 22X1 1/2 (OR ONLY) (NEEDLE) IMPLANT
NS IRRIG 1000ML POUR BTL (IV SOLUTION) ×2 IMPLANT
PACK TOTAL JOINT (CUSTOM PROCEDURE TRAY) ×2 IMPLANT
PACK UNIVERSAL I (CUSTOM PROCEDURE TRAY) ×2 IMPLANT
PAD ARMBOARD 7.5X6 YLW CONV (MISCELLANEOUS) ×4 IMPLANT
PAD CAST 4YDX4 CTTN HI CHSV (CAST SUPPLIES) ×1 IMPLANT
PADDING CAST COTTON 4X4 STRL (CAST SUPPLIES) ×2
PADDING CAST COTTON 6X4 STRL (CAST SUPPLIES) ×2 IMPLANT
SET HNDPC FAN SPRY TIP SCT (DISPOSABLE) ×1 IMPLANT
STAPLER VISISTAT 35W (STAPLE) ×2 IMPLANT
SUCTION FRAZIER TIP 10 FR DISP (SUCTIONS) ×2 IMPLANT
SUT BONE WAX W31G (SUTURE) ×2 IMPLANT
SUT ETHIBOND NAB CT1 #1 30IN (SUTURE) ×4 IMPLANT
SUT MNCRL AB 3-0 PS2 18 (SUTURE) ×2 IMPLANT
SUT VIC AB 0 CT1 27 (SUTURE) ×2
SUT VIC AB 0 CT1 27XBRD ANBCTR (SUTURE) ×1 IMPLANT
SUT VIC AB 1 CT1 27 (SUTURE) ×2
SUT VIC AB 1 CT1 27XBRD ANBCTR (SUTURE) ×1 IMPLANT
SYR CONTROL 10ML LL (SYRINGE) ×2 IMPLANT
TOWEL OR 17X24 6PK STRL BLUE (TOWEL DISPOSABLE) ×2 IMPLANT
TOWEL OR 17X26 10 PK STRL BLUE (TOWEL DISPOSABLE) ×2 IMPLANT
TRAY FOLEY CATH 16FRSI W/METER (SET/KITS/TRAYS/PACK) ×1 IMPLANT
UPCHARGE REV TRAY MBT KNEE ×2 IMPLANT
WATER STERILE IRR 1000ML POUR (IV SOLUTION) IMPLANT
WRAP KNEE MAXI GEL POST OP (GAUZE/BANDAGES/DRESSINGS) ×2 IMPLANT

## 2014-11-25 NOTE — Transfer of Care (Signed)
Immediate Anesthesia Transfer of Care Note  Patient: Marie GrossKathy O Foskett  Procedure(s) Performed: Procedure(s): TOTAL KNEE ARTHROPLASTY (Left)  Patient Location: PACU  Anesthesia Type:General  Level of Consciousness: awake and patient cooperative  Airway & Oxygen Therapy: Patient Spontanous Breathing and Patient connected to nasal cannula oxygen  Post-op Assessment: Report given to PACU RN, Post -op Vital signs reviewed and stable and Patient moving all extremities  Post vital signs: Reviewed and stable  Complications: No apparent anesthesia complications

## 2014-11-25 NOTE — Anesthesia Procedure Notes (Signed)
Procedure Name: LMA Insertion Date/Time: 11/25/2014 7:34 AM Performed by: Marni GriffonJAMES, Fredie Majano B Pre-anesthesia Checklist: Emergency Drugs available, Patient identified, Suction available and Patient being monitored Patient Re-evaluated:Patient Re-evaluated prior to inductionOxygen Delivery Method: Circle system utilized Preoxygenation: Pre-oxygenation with 100% oxygen Intubation Type: IV induction Ventilation: Mask ventilation without difficulty LMA: LMA inserted LMA Size: 4.0 Number of attempts: 1 Placement Confirmation: positive ETCO2,  CO2 detector and breath sounds checked- equal and bilateral ETT to lip (cm): taped across cheeks; gauze roll b/t teeth. Tube secured with: Tape Dental Injury: Teeth and Oropharynx as per pre-operative assessment

## 2014-11-25 NOTE — Evaluation (Signed)
Physical Therapy Evaluation Patient Details Name: Marie [name] MRN: [id number] DOB: [birth year] Today's Date: 11/25/2014   History of Present Illness  Pt admitted for left TKA  Clinical Impression  Pt very pleasant and moving well. Pt states she is not extremely active but desires to be able to clean the house and walk without pain. Family present throughout eval and educated for precautions, HEP,PWB, transfers, plan and goals. Pt will benefit from acute therapy to maximize strength, ROM, mobility and gait to decrease burden of care.     Follow Up Recommendations Home health PT;Supervision for mobility/OOB    Equipment Recommendations  Rolling walker with 5" wheels;3in1 (PT) (wide 3in1)    Recommendations for Other Services       Precautions / Restrictions Precautions Precautions: Knee;Fall Restrictions Weight Bearing Restrictions: Yes LLE Weight Bearing: Partial weight bearing LLE Partial Weight Bearing Percentage or Pounds: 50%      Mobility  Bed Mobility Overal bed mobility: Needs Assistance Bed Mobility: Supine to Sit     Supine to sit: Min assist     General bed mobility comments: cues for sequence with assist to move LLE, HOB elevated with use of rail  Transfers Overall transfer level: Needs assistance Equipment used: Rolling walker (2 wheeled) Transfers: Sit to/from Stand Sit to Stand: Min assist         General transfer comment: cues for sequence, hand placement and LLE position  Ambulation/Gait   Ambulation Distance (Feet): 6 Feet Assistive device: Rolling walker (2 wheeled) Gait Pattern/deviations: Step-to pattern   Gait velocity interpretation: Below normal speed for age/gender General Gait Details: cues for sequence and PWB status  Stairs            Wheelchair Mobility    Modified Rankin (Stroke Patients Only)       Balance Overall balance assessment: Needs assistance   Sitting balance-Leahy Scale: Good       Standing  balance-Leahy Scale: Fair                               Pertinent Vitals/Pain Pain Assessment: 0-10 Pain Score: 4  Pain Location: left knee incision Pain Descriptors / Indicators: Aching Pain Intervention(s): Limited activity within patient's tolerance;Repositioned;Ice applied         sats 100% on RA  Home Living Family/patient expects to be discharged to:: Private residence Living Arrangements: Spouse/significant other Available Help at Discharge: Family;Available 24 hours/day Type of Home: House Home Access: Stairs to enter Entrance Stairs-Rails: Right Entrance Stairs-Number of Steps: 3 Home Layout: One level Home Equipment: None      Prior Function Level of Independence: Independent               Hand Dominance        Extremity/Trunk Assessment   Upper Extremity Assessment: Overall WFL for tasks assessed           Lower Extremity Assessment: LLE deficits/detail   LLE Deficits / Details: limited by post op pain  Cervical / Trunk Assessment: Normal  Communication   Communication: No difficulties  Cognition Arousal/Alertness: Awake/alert Behavior During Therapy: WFL for tasks assessed/performed Overall Cognitive Status: Within Functional Limits for tasks assessed                      General Comments      Exercises Total Joint Exercises Ankle Circles/Pumps: AROM;Left;5 reps;Supine Quad Sets: AROM;Left;5 reps;Supine Heel Slides: AAROM;Left;5 reps;Supine  Assessment/Plan    PT Assessment Patient needs continued PT services  PT Diagnosis Difficulty walking;Acute pain   PT Problem List Decreased strength;Decreased range of motion;Decreased activity tolerance;Decreased mobility;Pain;Decreased knowledge of precautions;Decreased knowledge of use of DME  PT Treatment Interventions DME instruction;Gait training;Stair training;Functional mobility training;Therapeutic activities;Therapeutic exercise;Patient/family education   PT  Goals (Current goals can be found in the Care Plan section) Acute Rehab PT Goals Patient Stated Goal: walk without pain PT Goal Formulation: With patient/family Time For Goal Achievement: 12/02/14 Potential to Achieve Goals: Good    Frequency 7X/week   Barriers to discharge        Co-evaluation               End of Session   Activity Tolerance: Patient tolerated treatment well Patient left: in chair;with call bell/phone within reach;with family/visitor present Nurse Communication: Mobility status;Weight bearing status;Precautions         Time: [id number] PT Time Calculation (min) (ACUTE ONLY): 26 min   Charges:   PT Evaluation $Initial PT Evaluation Tier I: 1 Procedure PT Treatments $Therapeutic Activity: 8-22 mins   PT G CodesDelorse Lek:        Tabor, Harlyn Rathmann Beth 11/25/2014, 1:58 PM  Delaney MeigsMaija Tabor Satchel Heidinger, PT [phone]

## 2014-11-25 NOTE — Plan of Care (Signed)
Problem: Consults Goal: Diagnosis- Total Joint Replacement Primary Total Knee Left     

## 2014-11-25 NOTE — Discharge Instructions (Signed)
Information on my medicine - XARELTO® (Rivaroxaban) ° °This medication education was reviewed with me or my healthcare representative as part of my discharge preparation.  The pharmacist that spoke with me during my hospital stay was:  Albion, Tandrea Kommer Danielle, RPH ° °Why was Xarelto® prescribed for you? °Xarelto® was prescribed for you to reduce the risk of blood clots forming after orthopedic surgery. The medical term for these abnormal blood clots is venous thromboembolism (VTE). ° °What do you need to know about xarelto® ? °Take your Xarelto® ONCE DAILY at the same time every day. °You may take it either with or without food. ° °If you have difficulty swallowing the tablet whole, you may crush it and mix in applesauce just prior to taking your dose. ° °Take Xarelto® exactly as prescribed by your doctor and DO NOT stop taking Xarelto® without talking to the doctor who prescribed the medication.  Stopping without other VTE prevention medication to take the place of Xarelto® may increase your risk of developing a clot. ° °After discharge, you should have regular check-up appointments with your healthcare provider that is prescribing your Xarelto®.   ° °What do you do if you miss a dose? °If you miss a dose, take it as soon as you remember on the same day then continue your regularly scheduled once daily regimen the next day. Do not take two doses of Xarelto® on the same day.  ° °Important Safety Information °A possible side effect of Xarelto® is bleeding. You should call your healthcare provider right away if you experience any of the following: °? Bleeding from an injury or your nose that does not stop. °? Unusual colored urine (red or dark brown) or unusual colored stools (red or black). °? Unusual bruising for unknown reasons. °? A serious fall or if you hit your head (even if there is no bleeding). ° °Some medicines may interact with Xarelto® and might increase your risk of bleeding while on Xarelto®. To help  avoid this, consult your healthcare provider or pharmacist prior to using any new prescription or non-prescription medications, including herbals, vitamins, non-steroidal anti-inflammatory drugs (NSAIDs) and supplements. ° °This website has more information on Xarelto®: www.xarelto.com. ° ° °

## 2014-11-25 NOTE — Op Note (Signed)
PATIENT ID:      Jodelle GrossKathy O Mohon  MRN:     [id number] DOB/AGE:    55-18-1962 / 55 y.o.       OPERATIVE REPORT    DATE OF PROCEDURE:  11/25/2014       PREOPERATIVE DIAGNOSIS:   END STAGE OSTEOARTHRITIS LEFT KNEE                                                       Morbid Obesity with BMI 45     POSTOPERATIVE DIAGNOSIS:   END STAGE OSTEOARTHRITIS LEFT KNEE                                                                     SAME     PROCEDURE:  Procedure(s):LEFT TOTAL KNEE ARTHROPLASTY      SURGEON:  Norlene CampbellPeter Uchenna Seufert, MD    ASSISTANT:   Jacqualine CodeBrian Petrarca, PA-C   (Present and scrubbed throughout the case, critical for assistance with exposure, retraction, instrumentation, and closure.)          ANESTHESIA: regional and general     DRAINS: (left knee) Hemovact drain(s) in the clamped with  Suction Clamped :      TOURNIQUET TIME:  Total Tourniquet Time Documented: Thigh (Left) - 67 minutes Total: Thigh (Left) - 67 minutes     COMPLICATIONS:  None   CONDITION:  stable  PROCEDURE IN DETAIL: [id number]: [id number]   Joselynn Amoroso W 11/25/2014, 9:06 AM

## 2014-11-25 NOTE — Progress Notes (Signed)
Utilization review completed.  

## 2014-11-25 NOTE — Progress Notes (Signed)
Orthopedic Tech Progress Note Patient Details:  Marie GrossKathy O [name] 01-07-1961 [id number] CPM applied to LLE with appropriate settings. OHF applied to bed. CPM Left Knee CPM Left Knee: On Left Knee Flexion (Degrees): 90 Left Knee Extension (Degrees): 0   Asia R Thompson 11/25/2014, 10:59 AM

## 2014-11-25 NOTE — Anesthesia Postprocedure Evaluation (Signed)
Anesthesia Post Note  Patient: Marie [name]  Procedure(s) Performed: Procedure(s) (LRB): TOTAL KNEE ARTHROPLASTY (Left)  Anesthesia type: general  Patient location: PACU  Post pain: Pain level controlled  Post assessment: Patient's Cardiovascular Status Stable  Last Vitals:  Filed Vitals:   11/25/14 1200  BP: 120/59  Pulse: 73  Temp: 36.9 C  Resp: 14    Post vital signs: Reviewed and stable  Level of consciousness: sedated  Complications: No apparent anesthesia complications

## 2014-11-25 NOTE — Op Note (Signed)
NAME:  Marie [name], Marie [name]              ACCOUNT NO.:  [id number]  MEDICAL RECORD NO.:  [id number]  LOCATION:  [facility]                         FACILITY:  [facility]  PHYSICIAN:  Peter W. Whitfield, M.D.DATE OF BIRTH:  06/18/1961  DATE OF PROCEDURE:  11/25/2014 DATE OF DISCHARGE:                              OPERATIVE REPORT   PREOPERATIVE DIAGNOSES: 1. End-stage osteoarthritis, left knee. 2. Morbid obesity with a BMI of 45.  POSTOPERATIVE DIAGNOSES: 1. End-stage osteoarthritis, left knee. 2. Morbid obesity with a BMI of 45.  PROCEDURE:  Left total knee replacement.  SURGEON:  Peter W. Whitfield, MD  ASSISTANT:  Brian Petrarca PA-C, who was present throughout the operative procedure to ensure its timely completion.  ANESTHESIA:  General with supplemental femoral nerve block.  COMPLICATIONS:  None.  COMPONENTS:  DePuy LCS standard plus femoral component, a 12.5-mm polyethylene bridging bearing, an MBT size 3 revision tibial rotating platform, 3-Peg metal back rotating patella.  Components were secured with polymethyl methacrylate with added vancomycin.  DESCRIPTION OF PROCEDURE:  Marie [name] was met in the holding area, identified the left knee as appropriate operative site and marked it accordingly.  She did receive a preoperative femoral nerve block per Anesthesia.  Any questions were answered.  The patient was then transported to room #7 and placed under general anesthesia without difficulty.  The nursing staff inserted a Foley catheter.  Urine was clear.  Tourniquet was applied to the left thigh and the left leg was then prepped with chlorhexidine and scrubbed with DuraPrep x2 from the tourniquet to the tips of the toes.  Sterile draping was performed. Time-out was called.  The extremity was elevated and Esmarch exsanguinated with a proximal tourniquet at 350 mmHg.  A midline longitudinal incision was made centered about the patella extending from the tibial tubercle to the  superior pouch via sharp dissection.  Incision was carried down to subcutaneous tissue.  First layer of capsule was incised in midline and medial parapatellar incision was then made with the Bovie.  The joint was entered.  There was a clear yellow joint effusion approximately 25 mL in volume.  The patella was everted 180 degrees laterally, the knee flexed to 90 degrees.  There were large osteophytes along the medial and lateral femoral condyle.  Complete absence of articular cartilage in the medial femoral condyle and great extent to the medial tibial plateau.  There were large osteophytes along the medial tibial plateau.  Osteophytes were removed from the periphery of the femur and tibia.  I measured a standard plus femoral component.  First, a bony cut was then made transversely on the proximal tibia with a 3-degree angle of posterior declination.  Having measured the standard plus femoral component, the standard plus jig was then applied and the bony cuts were then made on the femur.  I used a 4-degree distal femoral valgus cut.  Flexion and extension gaps were perfectly symmetrical at 12.5.  MCL and LCL remained intact.  Laminar spreaders were then placed in the medial and lateral compartment.  I removed medial and lateral menisci as well as ACL and PCL, large osteophytes in the posterior femoral condyles medially and laterally.  There was a   small Baker cyst identified posteromedially. This was debrided, and there were no loose bodies.  I again checked to be sure that the flexion/extension gaps were symmetrical 12.5 mm.  Finishing guide was then applied for tapering purposes on the femur to obtain the center holes.  Retractors were then placed about the tibia, measured a #3 rotating tibial MBT revision tray, that is the reason for the 3-degree of angle of declination, any further osteophytes removed from around the tibia. A center hole was then made.  The MBT revision trial  was then inserted and the wedge cuts were then obtained for stability purposes.  With the tibial jig in place, a 12.5-mm polyethylene trial bridging bearing was inserted followed by the trial standard plus femoral component.  The components were reduced and through a full range of motion, full extension, and well over 105 degrees of flexion, there was no malrotation of the tibia.  I did check my bony cuts with the external guide after each cut.  The patella was prepared by removing approximately 11 mm of bone leaving 13 mm of patella thickness.  Three holes made, the trial patella inserted, and after reduction, there was no instability.  The trial components were then removed.  The joint was copiously irrigated with saline solution.  The final components were then impacted with polymethyl methacrylate with added vancomycin.  Initially, I inserted the MBT revision #3 tibial tray, followed by the 12.5-mm polyethylene bridging bearing, and the standard plus femoral component.  These were impacted in place and extraneous methacrylate was removed from the periphery.  We then reduced and held in extension with vertical compression.  The patella was applied with a patellar clamp and methacrylate.  At approximately 16 minutes, methacrylate had matured during which time, we injected the deep capsule with 0.25% Marcaine with epinephrine.  The tourniquet was deflated at 67 minutes.  I did not see any loose bodies.  I had nice capillary refill to the operative site.  Gross bleeders were Bovie coagulated.  Bone bleeding was controlled with bone wax.  A medium-size Hemovac was inserted.  I inspected the joint to be sure there was no further extraneous methacrylate.  The deep capsule was closed with #1 running Ethibond.  Superficial capsule closed in several layers with Vicryl and 3-0 Monocryl.  Skin was closed with skin clips.  Sterile bulky dressing was applied followed by the patient's support  stocking.  Hemovac was clamped.  The patient tolerated the procedure without complications, returned to the postanesthesia recovery room in satisfactory condition.     Peter W. Whitfield, M.D.     PWW/MEDQ  D:  11/25/2014  T:  11/25/2014  Job:  [id number] 

## 2014-11-25 NOTE — H&P (Signed)
  The recent History & Physical has been reviewed. I have personally examined the patient today. There is no interval change to the documented History & Physical. The patient would like to proceed with the procedure.  Norlene CampbellWHITFIELD, Lizette Pazos W 11/25/2014,  7:08 AM

## 2014-11-25 NOTE — Anesthesia Preprocedure Evaluation (Addendum)
Anesthesia Evaluation  Patient identified by MRN, date of birth, ID band Patient awake    Reviewed: Allergy & Precautions, NPO status , Patient's Chart, lab work & pertinent test results, reviewed documented beta blocker date and time   Airway Mallampati: II  TM Distance: >3 FB Neck ROM: Full    Dental  (+) Teeth Intact, Dental Advisory Given   Pulmonary          Cardiovascular hypertension, Pt. on medications     Neuro/Psych    GI/Hepatic   Endo/Other  diabetes, Type 2, Oral Hypoglycemic Agents"Borderline"  Renal/GU      Musculoskeletal   Abdominal   Peds  Hematology   Anesthesia Other Findings   Reproductive/Obstetrics                            Anesthesia Physical Anesthesia Plan  ASA: III  Anesthesia Plan: General   Post-op Pain Management:    Induction: Intravenous  Airway Management Planned: LMA  Additional Equipment:   Intra-op Plan:   Post-operative Plan: Extubation in OR  Informed Consent: I have reviewed the patients History and Physical, chart, labs and discussed the procedure including the risks, benefits and alternatives for the proposed anesthesia with the patient or authorized representative who has indicated his/her understanding and acceptance.     Plan Discussed with: CRNA and Surgeon  Anesthesia Plan Comments:         Anesthesia Quick Evaluation

## 2014-11-26 LAB — CBC
HEMATOCRIT: 32.2 % — AB (ref 36.0–46.0)
HEMOGLOBIN: 10.4 g/dL — AB (ref 12.0–15.0)
MCH: 27 pg (ref 26.0–34.0)
MCHC: 32.3 g/dL (ref 30.0–36.0)
MCV: 83.6 fL (ref 78.0–100.0)
Platelets: 188 10*3/uL (ref 150–400)
RBC: 3.85 MIL/uL — ABNORMAL LOW (ref 3.87–5.11)
RDW: 13.4 % (ref 11.5–15.5)
WBC: 8.1 10*3/uL (ref 4.0–10.5)

## 2014-11-26 LAB — BASIC METABOLIC PANEL
ANION GAP: 7 (ref 5–15)
BUN: 9 mg/dL (ref 6–23)
CO2: 27 mmol/L (ref 19–32)
CREATININE: 0.83 mg/dL (ref 0.50–1.10)
Calcium: 8.1 mg/dL — ABNORMAL LOW (ref 8.4–10.5)
Chloride: 102 mmol/L (ref 96–112)
GFR calc Af Amer: >90 mL/min (ref >90)
GFR, EST NON AFRICAN AMERICAN: 79 mL/min — AB (ref >90)
GLUCOSE: 145 mg/dL — AB (ref 70–99)
POTASSIUM: 3.3 mmol/L — AB (ref 3.5–5.1)
SODIUM: 136 mmol/L (ref 135–145)

## 2014-11-26 LAB — GLUCOSE, CAPILLARY
GLUCOSE-CAPILLARY: 159 mg/dL — AB (ref 70–99)
GLUCOSE-CAPILLARY: 150 mg/dL — AB (ref 70–99)
Glucose-Capillary: 181 mg/dL — ABNORMAL HIGH (ref 70–99)
Glucose-Capillary: 165 mg/dL — ABNORMAL HIGH (ref 70–99)

## 2014-11-26 MED ORDER — POTASSIUM CHLORIDE CRYS ER 20 MEQ PO TBCR
20.0000 meq | EXTENDED_RELEASE_TABLET | Freq: Once | ORAL | Status: AC
  Administered 2014-11-26: 20 meq via ORAL
  Filled 2014-11-26: qty 1

## 2014-11-26 MED ORDER — POTASSIUM CHLORIDE 20 MEQ PO PACK: 20.0000 meq | PACK | Freq: Once | ORAL | Status: DC

## 2014-11-26 NOTE — Progress Notes (Signed)
Patient ID: Marie [name], female   DOB: [birth year], 54 y.o.   MRN: [id number]    Norlene CampbellPeter Chet Greenley, MD   Jacqualine CodeBrian Petrarca, PA-C [address], [place], [place]  [address]                             [phone]   ORTHOPAEDIC HISTORY & PHYSICAL  Marie [name] MRN:  [id number] DOB/SEX:  [birth year]/female  CHIEF COMPLAINT:  Painful  PATIENT ID: Marie [name]        MRN:  [id number]          DOB/AGE: [birth year] / 54 y.o.    Norlene CampbellPeter Ezme Duch, MD   Jacqualine CodeBrian Petrarca, PA-C [address] [place], [place]  [address]                             [phone]   PROGRESS NOTE  Subjective:  negative for Chest Pain  negative for Shortness of Breath  negative for Nausea/Vomiting   negative for Calf Pain    Tolerating Diet: yes         Patient reports pain as 5 on 0-10 scale.     Comfortable with pain meds  Objective: Vital signs in last 24 hours:   Patient Vitals for the past 24 hrs:  BP Temp Temp src Pulse Resp SpO2  11/26/14 0654 (!) 124/58 mmHg 97.7 F (36.5 C) - 78 17 96 %  11/26/14 0400 - - - - 17 95 %  11/26/14 0128 124/60 mmHg 98.4 F (36.9 C) Oral 82 - 100 %  11/25/14 2354 - - - - 17 -  11/25/14 2027 121/67 mmHg 97.5 F (36.4 C) Oral 75 14 99 %  11/25/14 2000 - - - - 17 100 %  11/25/14 1352 - - - - - 100 %  11/25/14 1200 (!) 120/59 mmHg 98.4 F (36.9 C) Axillary 73 14 100 %  11/25/14 1123 - 98.1 F (36.7 C) - 84 - 99 %  11/25/14 1115 - - - 76 - 97 %  11/25/14 1100 123/77 mmHg - - 82 18 100 %  11/25/14 1045 126/74 mmHg - - 85 17 100 %  11/25/14 1030 (!) 142/83 mmHg - - 92 (!) 22 99 %  11/25/14 1015 134/79 mmHg - - 94 18 100 %  11/25/14 1000 (!) 148/65 mmHg - - - (!) 25 -  11/25/14 0945 (!) 148/89 mmHg - - 83 (!) 23 100 %  11/25/14 0938 (!) 153/120 mmHg 97.5 F (36.4 C) - 85 (!) 22 100 %      Intake/Output from previous day:   01/26 0701 - 01/27 0700 In: 3912.5 [P.O.:720; I.V.:2892.5] Out: 3170 [Urine:2745; Drains:375]   Intake/Output this shift:     Intake/Output      01/26 0701 - 01/27 0700 01/27 0701 - 01/28 0700   P.O. 720    I.V. 2892.5    IV Piggyback 300    Total Intake 3912.5     Urine 2745    Drains 375    Blood 50    Total Output 3170     Net +742.5             LABORATORY DATA:  Recent Labs  11/26/14 0537  WBC 8.1  HGB 10.4*  HCT 32.2*  PLT 188    Recent Labs  11/26/14 0537  NA 136  K 3.3*  CL 102  CO2 27  BUN 9  CREATININE 0.83  GLUCOSE 145*  CALCIUM 8.1*   Lab Results  Component Value Date   INR 0.96 11/14/2014    Recent Radiographic Studies :  Dg Chest 2 View  11/14/2014   CLINICAL DATA:  Preoperative evaluation LEFT knee replacement, history hypertension, diabetes, fibromyalgia  EXAM: CHEST  2 VIEW  COMPARISON:  None  FINDINGS: Upper normal heart size.  Tortuous aorta.  Pulmonary vascularity normal.  Lungs clear.  No pleural effusion or pneumothorax.  Scattered endplate spur formation thoracic spine.  IMPRESSION: No acute abnormalities.   Electronically Signed   By: Ulyses Southward M.D.   On: 11/14/2014 17:18     Examination:  General appearance: alert, cooperative and no distress  Wound Exam: clean, dry, intact   Drainage:  Moderate amount Serosanguinous exudate-150cc in hemovac  Motor Exam: EHL, FHL, Anterior Tibial and Posterior Tibial Intact  Sensory Exam: Superficial Peroneal, Deep Peroneal and Tibial normal  Vascular Exam: Normal  Assessment:    1 Day Post-Op  Procedure(s) (LRB): TOTAL KNEE ARTHROPLASTY (Left)  ADDITIONAL DIAGNOSIS:  Principal Problem:   Primary osteoarthritis of left knee Active Problems:   Diabetes mellitus type 2 in obese   Severe obesity (BMI >= 40)   Hypertension   S/P total knee replacement using cement  no new problems   Plan: Physical Therapy as ordered Partial Weight Bearing @ 50% (PWB)  DVT Prophylaxis:  Xarelto, Foot Pumps and TED hose  DISCHARGE PLAN: Home  DISCHARGE NEEDS: HHPT, CPM, Walker and 3-in-1 comode seat     Will leave  hemovac in place until am, change dressing in am, OOB with PT, replace K, foley out   Norlene Campbell W  11/26/2014 7:59 AM

## 2014-11-26 NOTE — Progress Notes (Signed)
Physical Therapy Treatment Patient Details Name: Marie [name] MRN: [id number] DOB: [birth year] Today's Date: 11/26/2014    History of Present Illness Pt admitted for left TKA    PT Comments    Making excellent progress, with significantly incr amb distance, good PWB, and no noted L knee buckle; stair training initiated also; Will plan to focus on therex next session  Follow Up Recommendations  Home health PT;Supervision for mobility/OOB     Equipment Recommendations  Rolling walker with 5" wheels;3in1 (PT) (wide 3in1)    Recommendations for Other Services       Precautions / Restrictions Precautions Precautions: Knee Precaution Comments: Pt educated to not allow any pillow or bolster under knee for healing with optimal range of motion.  Restrictions LLE Weight Bearing: Partial weight bearing LLE Partial Weight Bearing Percentage or Pounds: 50%    Mobility  Bed Mobility Overal bed mobility: Needs Assistance Bed Mobility: Sit to Supine       Sit to supine: Min assist   General bed mobility comments: Cues for technique; min assist to help LLE into bed  Transfers Overall transfer level: Needs assistance Equipment used: Rolling walker (2 wheeled) Transfers: Sit to/from Stand Sit to Stand: Min guard         General transfer comment: Pt with good hand placement and technique. No physical assist needed. Min guard for safety and balance.   Ambulation/Gait Ambulation/Gait assistance: Min guard;Supervision Ambulation Distance (Feet): 200 Feet Assistive device: Rolling walker (2 wheeled) Gait Pattern/deviations: Step-through pattern     General Gait Details: cues for sequence and PWB status; and to self-monitor for activity tolerance   Stairs Stairs: Yes Stairs assistance: Min assist Stair Management: No rails;With walker;Backwards;Step to pattern Number of Stairs: 3 General stair comments: verbal cues for sequence and technique; Daughter, Morrie Sheldon, provided  correct assist  Wheelchair Mobility    Modified Rankin (Stroke Patients Only)       Balance     Sitting balance-Leahy Scale: Good       Standing balance-Leahy Scale: Fair                      Cognition Arousal/Alertness: Awake/alert Behavior During Therapy: WFL for tasks assessed/performed Overall Cognitive Status: Within Functional Limits for tasks assessed                      Exercises      General Comments        Pertinent Vitals/Pain Pain Assessment: 0-10 Pain Score: 6  Pain Location: L knee Pain Descriptors / Indicators: Discomfort;Grimacing Pain Intervention(s): Monitored during session;Patient requesting pain meds-RN notified    Home Living                      Prior Function            PT Goals (current goals can now be found in the care plan section) Acute Rehab PT Goals Patient Stated Goal: to go home PT Goal Formulation: With patient/family Time For Goal Achievement: 12/02/14 Potential to Achieve Goals: Good Progress towards PT goals: Progressing toward goals    Frequency  7X/week    PT Plan Current plan remains appropriate    Co-evaluation             End of Session   Activity Tolerance: Patient tolerated treatment well Patient left: in bed;in CPM;with call bell/phone within reach;with family/visitor present     Time: 1120-1205 PT Time Calculation (min) (  ACUTE ONLY): 45 min  Charges:  $Gait Training: 23-37 mins $Therapeutic Activity: 8-22 mins                    G Codes:      Van ClinesGarrigan, Leshon Armistead Hamff 11/26/2014, 1:16 PM  Van ClinesHolly Sherley Mckenney, [place]  Acute Rehabilitation Services Pager [phone] Office [phone]

## 2014-11-26 NOTE — Progress Notes (Signed)
Physical Therapy Treatment Patient Details Name: Marie [name] MRN: [id number] DOB: Aug 02, 1961 Today's Date: 11/26/2014    History of Present Illness Pt admitted for left TKA    PT Comments    Session focused on therapeutic exercise, specifically Quad control of L knee against gravity, and pushing ROM as tolerated; noted deficits in flexion range, limited by pain; Still, good quad control against gravity; Politely declined amb at this time, plans to amb later this evening with daughter in hallways;   On track for dc home tomorrow   Follow Up Recommendations  Home health PT;Supervision for mobility/OOB     Equipment Recommendations  Rolling walker with 5" wheels;3in1 (PT) (wide 3in1)    Recommendations for Other Services       Precautions / Restrictions Precautions Precautions: Knee Precaution Comments: Pt educated to not allow any pillow or bolster under knee for healing with optimal range of motion.  Restrictions LLE Weight Bearing: Partial weight bearing LLE Partial Weight Bearing Percentage or Pounds: 50%    Mobility  Bed Mobility Overal bed mobility: Needs Assistance Bed Mobility: Sit to Supine       Sit to supine: Min assist   General bed mobility comments: Cues for technique; min assist to help LLE into bed  Transfers Overall transfer level: Needs assistance Equipment used: Rolling walker (2 wheeled) Transfers: Sit to/from Stand Sit to Stand: Min guard         General transfer comment: Pt with good hand placement and technique. No physical assist needed. Min guard for safety and balance.   Ambulation/Gait Ambulation/Gait assistance: Min guard;Supervision Ambulation Distance (Feet): 200 Feet Assistive device: Rolling walker (2 wheeled) Gait Pattern/deviations: Step-through pattern     General Gait Details: cues for sequence and PWB status; and to self-monitor for activity tolerance   Stairs Stairs: Yes Stairs assistance: Min assist Stair  Management: No rails;With walker;Backwards;Step to pattern Number of Stairs: 3 General stair comments: verbal cues for sequence and technique; Daughter, Marie [name], provided correct assist  Wheelchair Mobility    Modified Rankin (Stroke Patients Only)       Balance     Sitting balance-Leahy Scale: Good       Standing balance-Leahy Scale: Fair                      Cognition Arousal/Alertness: Awake/alert Behavior During Therapy: WFL for tasks assessed/performed Overall Cognitive Status: Within Functional Limits for tasks assessed                      Exercises Total Joint Exercises Ankle Circles/Pumps: AROM;Both;10 reps Quad Sets: AROM;Left;10 reps Short Arc QuadBarbaraann [name]: AAROM;Left;10 reps Heel Slides: AAROM;Left;10 reps Straight Leg Raises: AAROM;Left;10 reps Long Arc Quad: AAROM;Left;5 reps Knee Flexion: AAROM;Left;5 reps Goniometric ROM: approx 3-50 degrees    General Comments        Pertinent Vitals/Pain Pain Assessment: 0-10 Pain Score: 8  Pain Location: L knee with end range flexion Pain Descriptors / Indicators: Aching Pain Intervention(s): Monitored during session;Premedicated before session;Repositioned    Home Living                      Prior Function            PT Goals (current goals can now be found in the care plan section) Acute Rehab PT Goals Patient Stated Goal: to go home PT Goal Formulation: With patient/family Time For Goal Achievement: 12/02/14 Potential to Achieve Goals: Good Progress towards  PT goals: Progressing toward goals    Frequency  7X/week    PT Plan Current plan remains appropriate    Co-evaluation             End of Session   Activity Tolerance: Patient tolerated treatment well Patient left: in chair;with call bell/phone within reach     Time: 4098-1191 PT Time Calculation (min) (ACUTE ONLY): 25 min  Charges:  $Gait Training: 23-37 mins $Therapeutic Exercise: 23-37 mins $Therapeutic  Activity: 8-22 mins                    G Codes:      Marie [name] 11/26/2014, 4:11 PM  Marie [name], Marie [name]  Acute Rehabilitation Services Pager [phone] Office [phone]

## 2014-11-26 NOTE — Progress Notes (Signed)
11/26/14 [facility]. Spoke with patient, no change in discharge plan. [employer] providing CPM, rolling walker and 3N1. Family available to assist after d/c. No other d/c needs identified.

## 2014-11-26 NOTE — Evaluation (Signed)
Occupational Therapy Evaluation Patient Details Name: Marie [name] MRN: [id number] DOB: 1961-03-03 Today's Date: 11/26/2014    History of Present Illness Pt admitted for left TKA   Clinical Impression   PTA pt lived at home and was independent with ADLs. Pt currently limited by pain and decrease ROM in L knee. Pt requires min guard for functional mobility and (A) for LB ADLs. Pt will benefit from acute OT to address LB ADLs, AE training, and functional mobility.     Follow Up Recommendations  No OT follow up;Supervision/Assistance - 24 hour    Equipment Recommendations  3 in 1 bedside comode (wide BSC)    Recommendations for Other Services       Precautions / Restrictions Precautions Precautions: Knee;Fall Precaution Comments: Reviewed precautions and WB status Restrictions Weight Bearing Restrictions: Yes LLE Weight Bearing: Partial weight bearing LLE Partial Weight Bearing Percentage or Pounds: 50%      Mobility Bed Mobility               General bed mobility comments: Pt sitting up in recliner when OT arrived.   Transfers Overall transfer level: Needs assistance Equipment used: Rolling walker (2 wheeled) Transfers: Sit to/from Stand Sit to Stand: Min guard         General transfer comment: Pt with good hand placement and technique. No physical assist needed. Min guard for safety and balance.     Balance Overall balance assessment: Needs assistance Sitting-balance support: No upper extremity supported;Feet supported Sitting balance-Leahy Scale: Good     Standing balance support: Bilateral upper extremity supported;During functional activity Standing balance-Leahy Scale: Fair Standing balance comment: Pt able to remove single UE from RW to open door. Requires Bil UE support for dynamic activities.                             ADL Overall ADL's : Needs assistance/impaired Eating/Feeding: Independent;Sitting   Grooming: Min  guard;Standing   Upper Body Bathing: Set up;Sitting   Lower Body Bathing: Minimal assistance;Sit to/from stand   Upper Body Dressing : Set up;Sitting   Lower Body Dressing: Moderate assistance;Sit to/from stand   Toilet Transfer: Min guard;Ambulation;RW Toilet Transfer Details (indicate cue type and reason): sit<>stand from recliner         Functional mobility during ADLs: Min guard;Rolling walker General ADL Comments: Pt is progressing very well and educated on sitting edge of chair and slowly reaching for LLE to increase ROM. Demonstrated compensatory techniques for LB ADLs. Pt has tub at home and discussed use of 3N1 facing outside of tub for increased ease of sponge bathing.      Vision  Pt reports no change from baseline.                    Perception Perception Perception Tested?: No   Praxis Praxis Praxis tested?: Within functional limits    Pertinent Vitals/Pain Pain Assessment: 0-10 Pain Score: 4  Pain Location: Left knee Pain Descriptors / Indicators: Burning Pain Intervention(s): Limited activity within patient's tolerance;Monitored during session;Premedicated before session;Repositioned;Ice applied     Hand Dominance     Extremity/Trunk Assessment Upper Extremity Assessment Upper Extremity Assessment: Overall WFL for tasks assessed   Lower Extremity Assessment Lower Extremity Assessment: Defer to PT evaluation   Cervical / Trunk Assessment Cervical / Trunk Assessment: Normal   Communication Communication Communication: No difficulties   Cognition Arousal/Alertness: Awake/alert (minimally lethargic from pain medications) Behavior During Therapy:  WFL for tasks assessed/performed Overall Cognitive Status: Within Functional Limits for tasks assessed                                Home Living Family/patient expects to be discharged to:: Private residence Living Arrangements: Spouse/significant other Available Help at Discharge:  Family;Available 24 hours/day Type of Home: House Home Access: Stairs to enter Entergy CorporationEntrance Stairs-Number of Steps: 3 Entrance Stairs-Rails: Right Home Layout: One level     Bathroom Shower/Tub: Chief Strategy OfficerTub/shower unit   Bathroom Toilet: Standard     Home Equipment: None          Prior Functioning/Environment Level of Independence: Independent             OT Diagnosis: Generalized weakness;Acute pain   OT Problem List: Decreased strength;Decreased range of motion;Decreased activity tolerance;Impaired balance (sitting and/or standing);Decreased knowledge of use of DME or AE;Decreased knowledge of precautions;Pain   OT Treatment/Interventions: Self-care/ADL training;Therapeutic exercise;Energy conservation;DME and/or AE instruction;Therapeutic activities;Patient/family education;Balance training    OT Goals(Current goals can be found in the care plan section) Acute Rehab OT Goals Patient Stated Goal: to go home OT Goal Formulation: With patient Time For Goal Achievement: 12/10/14 Potential to Achieve Goals: Good ADL Goals Pt Will Perform Grooming: with modified independence;standing Pt Will Perform Lower Body Bathing: with set-up;with supervision;with adaptive equipment;sit to/from stand Pt Will Perform Lower Body Dressing: with set-up;with supervision;with adaptive equipment;sit to/from stand Pt Will Transfer to Toilet: with modified independence;ambulating;bedside commode Pt Will Perform Toileting - Clothing Manipulation and hygiene: with modified independence;sit to/from stand  OT Frequency: Min 2X/week    End of Session Equipment Utilized During Treatment: Gait belt;Rolling walker CPM Left Knee CPM Left Knee: Off  Activity Tolerance: Patient tolerated treatment well Patient left: in chair;with call bell/phone within reach;with family/visitor present   Time: [id number] OT Time Calculation (min): 22 min Charges:  OT General Charges $OT Visit: 1 Procedure OT  Evaluation $Initial OT Evaluation Tier I: 1 Procedure G-Codes:    Rae LipsMiller, Elleanor Guyett M 11/26/2014, 10:12 AM   Carney LivingLeeAnn Marie Eann Cleland, OTR/L Occupational Therapist [phone] (pager)

## 2014-11-27 ENCOUNTER — Encounter (HOSPITAL_COMMUNITY): Payer: Self-pay | Admitting: General Practice

## 2014-11-27 LAB — CBC
HCT: 31.1 % — ABNORMAL LOW (ref 36.0–46.0)
Hemoglobin: 10.1 g/dL — ABNORMAL LOW (ref 12.0–15.0)
MCH: 27.4 pg (ref 26.0–34.0)
MCHC: 32.5 g/dL (ref 30.0–36.0)
MCV: 84.3 fL (ref 78.0–100.0)
Platelets: 194 10*3/uL (ref 150–400)
RBC: 3.69 MIL/uL — ABNORMAL LOW (ref 3.87–5.11)
RDW: 13.1 % (ref 11.5–15.5)
WBC: 9.4 10*3/uL (ref 4.0–10.5)

## 2014-11-27 LAB — GLUCOSE, CAPILLARY
GLUCOSE-CAPILLARY: 142 mg/dL — AB (ref 70–99)
Glucose-Capillary: 118 mg/dL — ABNORMAL HIGH (ref 70–99)

## 2014-11-27 LAB — BASIC METABOLIC PANEL
ANION GAP: 8 (ref 5–15)
BUN: 6 mg/dL (ref 6–23)
CO2: 27 mmol/L (ref 19–32)
Calcium: 8.4 mg/dL (ref 8.4–10.5)
Chloride: 100 mmol/L (ref 96–112)
Creatinine, Ser: 0.78 mg/dL (ref 0.50–1.10)
GFR calc Af Amer: >90 mL/min (ref >90)
GFR calc non Af Amer: >90 mL/min (ref >90)
Glucose, Bld: 186 mg/dL — ABNORMAL HIGH (ref 70–99)
Potassium: 3.4 mmol/L — ABNORMAL LOW (ref 3.5–5.1)
Sodium: 135 mmol/L (ref 135–145)

## 2014-11-27 MED ORDER — METHOCARBAMOL 500 MG PO TABS: 500.0000 mg | ORAL_TABLET | Freq: Three times a day (TID) | ORAL | Status: DC | PRN

## 2014-11-27 MED ORDER — OXYCODONE HCL 5 MG PO TABS: 5.0000 mg | ORAL_TABLET | ORAL | Status: DC | PRN

## 2014-11-27 MED ORDER — POTASSIUM CHLORIDE CRYS ER 20 MEQ PO TBCR
40.0000 meq | EXTENDED_RELEASE_TABLET | Freq: Once | ORAL | Status: AC
  Administered 2014-11-27: 40 meq via ORAL
  Filled 2014-11-27: qty 2

## 2014-11-27 MED ORDER — RIVAROXABAN 10 MG PO TABS: 10.0000 mg | ORAL_TABLET | Freq: Every day | ORAL | Status: DC

## 2014-11-27 NOTE — Progress Notes (Signed)
Physical Therapy Treatment Patient Details Name: Marie [name] MRN: [id number] DOB: 1961-06-12 Today's Date: 11/27/2014    History of Present Illness Pt admitted for left TKA    PT Comments    Pt very discouraged due to pain this session. Pain greatly limited session but pt was agreeable to practice stair management for D/C home today. Daughter present for stair training. RN made aware of pt 10/10 pain.   Follow Up Recommendations  Home health PT;Supervision for mobility/OOB     Equipment Recommendations  Rolling walker with 5" wheels;3in1 (PT)    Recommendations for Other Services       Precautions / Restrictions Precautions Precautions: Knee Precaution Comments: reviewed WB status  Restrictions Weight Bearing Restrictions: Yes LLE Weight Bearing: Partial weight bearing LLE Partial Weight Bearing Percentage or Pounds: 50%    Mobility  Bed Mobility Overal bed mobility: Needs Assistance Bed Mobility: Supine to Sit     Supine to sit: Min assist     General bed mobility comments: bed flatted to simulate home; pt relying on handrails and daughter to (A) Lt LE off EOB; pt limited due to pain   Transfers Overall transfer level: Needs assistance Equipment used: Rolling walker (2 wheeled) Transfers: Sit to/from Stand Sit to Stand: Supervision         General transfer comment: cues for hand placement; incr time required due to pain   Ambulation/Gait Ambulation/Gait assistance: Supervision Ambulation Distance (Feet): 60 Feet Assistive device: Rolling walker (2 wheeled) Gait Pattern/deviations: Step-to pattern;Decreased stance time - left;Decreased step length - right;Ataxic Gait velocity: decr due to pain  Gait velocity interpretation: Below normal speed for age/gender General Gait Details: cues for PWB status and upright posture; pt requiring multiple standing rest breaks due to pain    Stairs Stairs: Yes Stairs assistance: Min assist Stair Management: No  rails;Step to pattern;Backwards;With walker Number of Stairs: 2 General stair comments: daughter (A) with stair management; reviewed technique and given handout; pt very anxious; max cues for safety   Wheelchair Mobility    Modified Rankin (Stroke Patients Only)       Balance Overall balance assessment: Needs assistance Sitting-balance support: Feet supported;No upper extremity supported Sitting balance-Leahy Scale: Good     Standing balance support: Bilateral upper extremity supported;During functional activity Standing balance-Leahy Scale: Poor Standing balance comment: Rquires RW to balance                     Cognition Arousal/Alertness: Awake/alert Behavior During Therapy: WFL for tasks assessed/performed Overall Cognitive Status: Within Functional Limits for tasks assessed                      Exercises Total Joint Exercises Ankle Circles/Pumps: AROM;Both;10 reps    General Comments General comments (skin integrity, edema, etc.): verbally reviewed car transfer technique and HEP      Pertinent Vitals/Pain Pain Assessment: 0-10 Pain Score: 10-Worst pain ever Pain Location: Lt knee Pain Descriptors / Indicators: Burning;Pressure Pain Intervention(s): Monitored during session;Repositioned;Limited activity within patient's tolerance;Premedicated before session;Patient requesting pain meds-RN notified;Ice applied    Home Living                      Prior Function            PT Goals (current goals can now be found in the care plan section) Acute Rehab PT Goals Patient Stated Goal: to not hurt  PT Goal Formulation: With patient/family Time For Goal  Achievement: 12/02/14 Potential to Achieve Goals: Good Progress towards PT goals: Progressing toward goals    Frequency  7X/week    PT Plan Current plan remains appropriate    Co-evaluation             End of Session Equipment Utilized During Treatment: Gait belt Activity  Tolerance: Patient limited by pain Patient left: in chair;with call bell/phone within reach;with family/visitor present     Time: 0753-0819 PT Time Calculation (min) (ACUTE ONLY): 26 min  Charges:  Automatic Data Training: 23-37 mins                    G CodesDonnamarie Poag Keller, [place]  [phone] 11/27/2014, 8:26 AM

## 2014-11-27 NOTE — Progress Notes (Signed)
Patient ID: Marie [name], female   DOB: [birth year], 54 y.o.   MRN: [id number] PATIENT ID: Marie [name]        MRN:  [id number]          DOB/AGE: 18-Nov-1960 / 54 y.o.    Norlene Campbell, MD   Jacqualine Code, PA-C [address] [place], [place]  [address]                             [phone]   PROGRESS NOTE  Subjective:  negative for Chest Pain  negative for Shortness of Breath  negative for Nausea/Vomiting   negative for Calf Pain    Tolerating Diet: yes         Patient reports pain as mild and moderate.     Doing well and desires discharge   Objective: Vital signs in last 24 hours:   Patient Vitals for the past 24 hrs:  BP Temp Pulse Resp SpO2  11/27/14 0600 115/63 mmHg 98.7 F (37.1 C) 91 18 99 %  11/26/14 2200 (!) 130/57 mmHg 98.3 F (36.8 C) 88 18 99 %  11/26/14 2000 - - - 17 -      Intake/Output from previous day:   01/27 0701 - 01/28 0700 In: 630 [P.O.:480; I.V.:150] Out: 100 [Drains:100]   Intake/Output this shift:   01/28 0701 - 01/28 1900 In: 240 [P.O.:240] Out: 175 [Drains:175]   Intake/Output      01/27 0701 - 01/28 0700 01/28 0701 - 01/29 0700   P.O. 480 240   I.V. 150    IV Piggyback     Total Intake 630 240   Urine 0 0   Drains 100 175   Blood     Total Output 100 175   Net +530 +65        Urine Occurrence 1 x 1 x      LABORATORY DATA:  Recent Labs  11/26/14 0537 11/27/14 0825  WBC 8.1 9.4  HGB 10.4* 10.1*  HCT 32.2* 31.1*  PLT 188 194    Recent Labs  11/26/14 0537 11/27/14 0825  NA 136 135  K 3.3* 3.4*  CL 102 100  CO2 27 27  BUN 9 6  CREATININE 0.83 0.78  GLUCOSE 145* 186*  CALCIUM 8.1* 8.4   Lab Results  Component Value Date   INR 0.96 11/14/2014    Recent Radiographic Studies :  Dg Chest 2 View  11/14/2014   CLINICAL DATA:  Preoperative evaluation LEFT knee replacement, history hypertension, diabetes, fibromyalgia  EXAM: CHEST  2 VIEW  COMPARISON:  None  FINDINGS: Upper normal heart size.  Tortuous  aorta.  Pulmonary vascularity normal.  Lungs clear.  No pleural effusion or pneumothorax.  Scattered endplate spur formation thoracic spine.  IMPRESSION: No acute abnormalities.   Electronically Signed   By: Ulyses Southward M.D.   On: 11/14/2014 17:18     Examination:  General appearance: alert, cooperative, mild distress and moderate distress Resp: clear to auscultation bilaterally Cardio: regular rate and rhythm GI: normal findings: bowel sounds normal  Wound Exam: clean, dry, intact   Drainage:  None: wound tissue dry  Motor Exam: EHL, FHL, Anterior Tibial and Posterior Tibial Intact  Sensory Exam: Superficial Peroneal, Deep Peroneal and Tibial normal  Vascular Exam: Left dorsalis pedis artery has trace pulse  Assessment:    2 Days Post-Op  Procedure(s) (LRB): TOTAL KNEE ARTHROPLASTY (Left)  ADDITIONAL DIAGNOSIS:  Principal  Problem:   Primary osteoarthritis of left knee Active Problems:   Diabetes mellitus type 2 in obese   Severe obesity (BMI >= 40)   Hypertension   S/P total knee replacement using cement     Plan: Physical Therapy as ordered Partial Weight Bearing @ 50% (PWB)  DVT Prophylaxis:  Xarelto, Foot Pumps and TED hose  DISCHARGE PLAN: Home  DISCHARGE NEEDS: HHPT, CPM, Walker and 3-in-1 comode seat        Miki Blank  11/27/2014 1:12 PM

## 2014-11-27 NOTE — Progress Notes (Signed)
Discharge instructions gave to pt and all questions answered. Pt is ready for discharge.

## 2014-11-28 NOTE — Discharge Summary (Signed)
Marie CampbellPeter Whitfield, MD   Jacqualine CodeBrian Petrarca, PA-C [address], [place], [place]  [id number]                             [phone]  PATIENT ID: Marie GrossKathy O Folker        MRN:  [id number]          DOB/AGE: 1961-09-17 / 54 y.o.    DISCHARGE SUMMARY  ADMISSION DATE:    11/25/2014 DISCHARGE DATE:   11/27/2014   ADMISSION DIAGNOSIS: END STAGE OSTEOARTHRITIS LEFT KNEE    DISCHARGE DIAGNOSIS:  END STAGE OSTEOARTHRITIS LEFT KNEE    ADDITIONAL DIAGNOSIS: Principal Problem:   Primary osteoarthritis of left knee Active Problems:   Diabetes mellitus type 2 in obese   Severe obesity (BMI >= 40)   Hypertension   S/P total knee replacement using cement  Past Medical History  Diagnosis Date  . Diabetes mellitus without complication     borderline, states she is using Victoza for weight loss, Rx by PCP  . Fibromyalgia     followed by Dr. Corliss Skainseveshwar  . Arthritis     knees, knees, neck, shoulders, hands   . Hypertension     PROCEDURE: Procedure(s): TOTAL KNEE ARTHROPLASTY Left on 11/25/2014  CONSULTS: none     HISTORY:Marie [name] is a very pleasant 54 year old female who is seen today for evaluation of her left knee.  She has been treated for her osteoarthritis by Dr. Corliss Skainseveshwar and Melina CopaHans Panwala, PA.  She has continued to have pain and discomfort in the knee to the point where she has pain with every step, as well as nighttime pain.  She does have some swelling at times.  She is getting some catching and locking and giving way symptoms.  She has been tried on anti-inflammatories of Celebrex, which has not helped her knees, but certainly has been helpful to her pain.  She has had corticosteroid injections, which are now becoming refractory.  She has also had viscosupplementation, which unfortunately has only been helpful for about 3-4 months.  She is now to the point where she is having difficulty with ambulation and her activities of daily living  HOSPITAL COURSE:  Marie [name] is a 54 y.o.  admitted on 11/25/2014 and found to have a diagnosis of END STAGE OSTEOARTHRITIS LEFT KNEE.  After appropriate laboratory studies were obtained  they were taken to the operating room on 11/25/2014 and underwent  Procedure(s): TOTAL KNEE ARTHROPLASTY  Left.   They were given perioperative antibiotics:  Anti-infectives    Start     Dose/Rate Route Frequency Ordered Stop   11/25/14 1330  ceFAZolin (ANCEF) IVPB 2 g/50 mL premix     2 g100 mL/hr over 30 Minutes Intravenous Every 6 hours 11/25/14 1202 11/25/14 2128   11/25/14 0758  vancomycin (VANCOCIN) powder  Status:  Discontinued       As needed 11/25/14 0800 11/25/14 0936   11/25/14 0600  ceFAZolin (ANCEF) 3 g in dextrose 5 % 50 mL IVPB     3 g160 mL/hr over 30 Minutes Intravenous On call to O.R. 11/24/14 1243 11/25/14 0735   11/25/14 0600  ceFAZolin (ANCEF) 3 g in dextrose 5 % 50 mL IVPB  Status:  Discontinued     3 g160 mL/hr over 30 Minutes Intravenous On call to O.R. 11/24/14 1243 11/25/14 1138      Tolerated the procedure well.  Placed with a foley intraoperatively.  Toradol was given post op.  POD #1, allowed out of bed to a chair.  PT for ambulation and exercise program.  Foley D/C'd in morning.  IV saline locked.  O2 discontionued.  POD #2, continued PT and ambulation.   Hemovac pulled. .  The remainder of the hospital course was dedicated to ambulation and strengthening.   The patient was discharged on 2 Days Post-Op in  Stable condition.  Blood products given:none  DIAGNOSTIC STUDIES: Recent vital signs: No data found.      Recent laboratory studies:  Recent Labs  11/26/14 0537 11/27/14 0825  WBC 8.1 9.4  HGB 10.4* 10.1*  HCT 32.2* 31.1*  PLT 188 194    Recent Labs  11/26/14 0537 11/27/14 0825  NA 136 135  K 3.3* 3.4*  CL 102 100  CO2 27 27  BUN 9 6  CREATININE 0.83 0.78  GLUCOSE 145* 186*  CALCIUM 8.1* 8.4   Lab Results  Component Value Date   INR 0.96 11/14/2014     Recent Radiographic Studies :   Dg Chest 2 View  11/14/2014   CLINICAL DATA:  Preoperative evaluation LEFT knee replacement, history hypertension, diabetes, fibromyalgia  EXAM: CHEST  2 VIEW  COMPARISON:  None  FINDINGS: Upper normal heart size.  Tortuous aorta.  Pulmonary vascularity normal.  Lungs clear.  No pleural effusion or pneumothorax.  Scattered endplate spur formation thoracic spine.  IMPRESSION: No acute abnormalities.   Electronically Signed   By: Ulyses Southward M.D.   On: 11/14/2014 17:18    DISCHARGE INSTRUCTIONS: Discharge Instructions    CPM    Complete by:  As directed   Continuous passive motion machine (CPM):      Use the CPM from 0 to 60 for 6-8 hours per day.      You may increase by 5-10 per day.  You may break it up into 2 or 3 sessions per day.      Use CPM for 3-4 weeks or until you are told to stop.     Call MD / Call 911    Complete by:  As directed   If you experience chest pain or shortness of breath, CALL 911 and be transported to the hospital emergency room.  If you develop a fever above 101 F, pus (white drainage) or increased drainage or redness at the wound, or calf pain, call your surgeon's office.     Change dressing    Complete by:  As directed   DO NOT CHANGE DRESSING.     Constipation Prevention    Complete by:  As directed   Drink plenty of fluids.  Prune juice and/or coffee may be helpful.  You may use a stool softener, such as Colace (over the counter) 100 mg twice a day.  Use MiraLax (over the counter) for constipation as needed but this may take several days to work.  Mag Citrate --OR-- Milk of Magnesia --OR -- Dulcolax pills/suppositories may also be used but follow directions on the label.     Diet general    Complete by:  As directed      Discharge instructions    Complete by:  As directed   YOU WERE GIVEN A DEVICE CALLED AN INCENTIVE SPIROMETER TO HELP YOU TAKE DEEP BREATHS.  PLEASE USE THIS AT LEAST TEN (10) TIMES EVERY 1-2 HOURS EVERY DAY TO PREVENT PNEUMONIA.     Do not put  a pillow under the knee. Place it under the heel.  Complete by:  As directed      Driving restrictions    Complete by:  As directed   No driving for 6 weeks     Increase activity slowly as tolerated    Complete by:  As directed      Lifting restrictions    Complete by:  As directed   No lifting for 6 weeks     Partial weight bearing    Complete by:  As directed   50 % WEIGHT BEARING AS TAUGHT IN PHYSICAL THERAPY     Patient may shower    Complete by:  As directed   You may shower over the brown dressing.     TED hose    Complete by:  As directed   Use stockings (TED hose) for 2 weeks on operative leg(s).  You may remove them at night for sleeping.           DISCHARGE MEDICATIONS:     Medication List    STOP taking these medications        aspirin EC 81 MG tablet     celecoxib 200 MG capsule  Commonly known as:  CELEBREX     FISH OIL PO     traMADol 50 MG tablet  Commonly known as:  ULTRAM      TAKE these medications        estradiol 1 MG tablet  Commonly known as:  ESTRACE  Take 1 mg by mouth at bedtime.     lidocaine 5 %  Commonly known as:  LIDODERM  Place 1-3 patches onto the skin every 12 (twelve) hours as needed (pain).     Liraglutide 18 MG/3ML Sopn  Inject 1.8 mg into the skin daily. For weight control     losartan-hydrochlorothiazide 100-25 MG per tablet  Commonly known as:  HYZAAR  Take 0.5 tablets by mouth at bedtime.     methocarbamol 500 MG tablet  Commonly known as:  ROBAXIN  Take 1 tablet (500 mg total) by mouth every 8 (eight) hours as needed for muscle spasms.     oxyCODONE 5 MG immediate release tablet  Commonly known as:  Oxy IR/ROXICODONE  Take 1-2 tablets (5-10 mg total) by mouth every 4 (four) hours as needed for moderate pain, severe pain or breakthrough pain.     rivaroxaban 10 MG Tabs tablet  Commonly known as:  XARELTO  Take 1 tablet (10 mg total) by mouth daily with breakfast.     simvastatin 40 MG tablet  Commonly  known as:  ZOCOR  Take 40 mg by mouth at bedtime.        FOLLOW UP VISIT:       Follow-up Information    Follow up with [facility].   Why:  They will contact you to schedule home therapy.   Contact information:   [address] [place] [address] [phone]       Follow up with Valeria Batman, MD. Schedule an appointment as soon as possible for a visit on 12/08/2014.   Specialty:  Orthopedic Surgery   Contact information:   [address] [place] [address] [phone]       DISPOSITION:   Home  CONDITION:  Stable   Oris Drone. Aleda Grana [facility] [phone]  11/28/2014 1:06 PM

## 2014-12-16 ENCOUNTER — Ambulatory Visit: Payer: 59 | Attending: Orthopaedic Surgery | Admitting: Physical Therapy

## 2014-12-16 DIAGNOSIS — R269 Unspecified abnormalities of gait and mobility: Secondary | ICD-10-CM

## 2014-12-16 DIAGNOSIS — M25562 Pain in left knee: Secondary | ICD-10-CM | POA: Diagnosis not present

## 2014-12-16 DIAGNOSIS — M25662 Stiffness of left knee, not elsewhere classified: Secondary | ICD-10-CM

## 2014-12-16 NOTE — Therapy (Signed)
Marie [name] Phone: [phone]   Fax:  [phone]  Physical Therapy Evaluation  Patient Details  Name: Marie GrossKathy O [name] MRN: [id number] Date of Birth: 1961-03-31 Referring Provider:  Valeria BatmanWhitfield, Peter W, MD  Encounter Date: 12/16/2014      PT End of Session - 12/16/14 1413    Visit Number 1   Number of Visits 16   Date for PT Re-Evaluation 02/10/15   PT Start Time 1325   PT Stop Time 1415   PT Time Calculation (min) 50 min   Activity Tolerance Patient tolerated treatment well      Past Medical History  Diagnosis Date  . Diabetes mellitus without complication     borderline, states she is using Victoza for weight loss, Rx by PCP  . Fibromyalgia     followed by Dr. Corliss Skainseveshwar  . Arthritis     knees, knees, neck, shoulders, hands   . Hypertension     Past Surgical History  Procedure Laterality Date  . Myomectomy      several surgeries for this problem  . Abdominal hysterectomy  2000  . Tonsillectomy    . Total knee arthroplasty Left 11/25/2014    dr whitfield  . Total knee arthroplasty Left 11/25/2014    Procedure: TOTAL KNEE ARTHROPLASTY;  Surgeon: Marie BatmanPeter [name] Whitfield, MD;  [facility];  Service: Orthopedics;  Laterality: Left;    There were no vitals taken for this visit.  Visit Diagnosis:  Stiffness of joint, lower leg, left  Pain in knee joint, left  Abnormality of gait      Subjective Assessment - 12/16/14 1330    Symptoms Patient underwent L TKR 11/25/14 and had HHPT for 3 weeks. She has difficulty with knee stiffness (esp at night), housework and full mobility in the community.     Pertinent History OA, diabetes, high cholesterol and HTN   Limitations Standing;Walking;House hold activities  Stairs   How long can you sit comfortably? as needed   How long can you stand comfortably? over 30 min    How long can you walk comfortably? has not pushed it   Diagnostic tests knee    Patient Stated Goals just doing my everyday activities, return to work   Currently in Pain? No/denies  stiffness dominates   Pain Location Knee   Pain Orientation Left   Pain Descriptors / Indicators Tightness   Pain Type Surgical pain   Pain Frequency Occasional   Multiple Pain Sites No          [facility] PT Assessment - 12/16/14 1336    Assessment   Medical Diagnosis L TKR   Onset Date 11/25/14   Next MD Visit 1 week   Prior Therapy NO OPPT   Precautions   Precautions None   Restrictions   Weight Bearing Restrictions No   Balance Screen   Has the patient fallen in the past 6 months No   Has the patient had a decrease in activity level because of a fear of falling?  No   Is the patient reluctant to leave their home because of a fear of falling?  No   Home Environment   Living Enviornment Private residence   Living Arrangements Spouse/significant other   Home Access Stairs to enter   Entrance Stairs-Number of Steps 5   Entrance Stairs-Rails Can reach both   Prior Function   Vocation Full time employment   Vocation Requirements office work  Sensation   Light Touch Appears Intact  reports occ numbness lateral L knee   Posture/Postural Control   Posture/Postural Control Postural limitations   Postural Limitations --  Rt. knee hyperext in standing   AROM   Right Knee Extension -2   Right Knee Flexion 125   Left Knee Extension 10   Left Knee Flexion 105   Strength   Right Hip Flexion 4+/5   Right Hip ABduction 4-/5   Left Hip Flexion 4+/5   Left Hip ABduction 3+/5   Right Knee Flexion 5/5   Right Knee Extension 5/5   Left Knee Flexion 4-/5   Left Knee Extension 4/5   Palpation   Palpation tenderness along incision, steri-strips in place               PT Short Term Goals - 12/16/14 1527    PT SHORT TERM GOAL #1   Title Pt will be I with HEP and use if RICE for pain, edema   Time 2   Period Weeks   Status New   PT SHORT TERM  GOAL #2   Title Pt will be able to flex knee to 115 deg with AAROM for improved transfers   Time 4   Period Weeks   Status New   PT SHORT TERM GOAL #3   Title Pt will be able to resume all household tasks unlimited by knee pain    Time 4   Period Weeks   Status New           PT Long Term Goals - 12/16/14 1529    PT LONG TERM GOAL #1   Title Pt will be I with advanced HEP for knee   Time 8   Period Weeks   Status New   PT LONG TERM GOAL #2   Title Pt will walk without cane for all mobilty in community   Time 8   Period Weeks   Status New   PT LONG TERM GOAL #3   Title Pt will report min occasional stiffness in knee at night for improved rest, sleep.    Time 8   Period Weeks   Status New   PT LONG TERM GOAL #4   Title Pt will be able to negotiate stairs with 1 rail reciprocally and no pain increase   Time 8   Period Weeks   Status New   PT LONG TERM GOAL #5   Title Pt will score <40% on FOTO for improved function overall   Time 8   Period Weeks   Status New               Plan - 12/16/14 1413    Clinical Impression Statement Patient presents with knee stiffness, decr AROM and mild L LE weakness which limits mobility.  She has used CPM machine each day since surgery (set at 125 deg).  She will benefit from skilled PT    Pt will benefit from skilled therapeutic intervention in order to improve on the following deficits Abnormal gait;Decreased range of motion;Difficulty walking;Impaired flexibility;Decreased endurance;Decreased activity tolerance;Pain;Decreased scar mobility;Decreased balance;Decreased mobility;Decreased strength   Rehab Potential Excellent   PT Frequency 2x / week   PT Duration 8 weeks   PT Treatment/Interventions ADLs/Self Care Home Management;Moist Heat;Therapeutic activities;Patient/family education;Scar mobilization;Passive range of motion;Therapeutic exercise;DME Instruction;Ultrasound;Gait training;Balance training;Manual  techniques;Neuromuscular re-education;Stair training;Cryotherapy;Manual lymph drainage;Functional mobility training;Electrical Stimulation   PT Next Visit Plan review level 1 knee, Nustep, progress ROM with manual   PT  Home Exercise Plan knee level 1   Consulted and Agree with Plan of Care Patient          G-Codes - 12/26/2014 1412    Functional Assessment Tool Used FOTO   Functional Limitation Mobility: Walking and moving around   Mobility: Walking and Moving Around Current Status [phone]) At least 40 percent but less than 60 percent impaired, limited or restricted   Mobility: Walking and Moving Around Goal Status [phone]) At least 20 percent but less than 40 percent impaired, limited or restricted       Problem List Patient Active Problem List   Diagnosis Date Noted  . Primary osteoarthritis of left knee 11/25/2014  . Diabetes mellitus type 2 in obese 11/25/2014  . Severe obesity (BMI >= 40) 11/25/2014  . Hypertension 11/25/2014  . S/P total knee replacement using cement 11/25/2014  . RUQ PAIN 03/14/2007    Ezmae Speers 26-Dec-2014, 3:32 PM  [facility] [address] [place], [place], [address] Phone: [phone]   Fax:  [phone]

## 2014-12-16 NOTE — Patient Instructions (Signed)
   Copyright  VHI. All rights reserved.  HIP: Flexion / KNEE: Extension, Straight Leg Raise   Raise leg, keeping knee straight. Perform slowly. __10_ reps per set, _2 sets per day, _5-7__ days per week   Copyright  VHI. All rights reserved.  Heel Slide   Bend knee and pull heel toward buttocks. Hold __20__ seconds. Return. Repeat with other knee. USE A SHEET TO HELP PULL ON YOUR FOOT  Repeat __3-5__ times. Do __2__ sessions per day.  http://gt2.exer.us/372   Copyright  VHI. All rights reserved.     Raise leg until knee is straight. _20__ reps per set, ___2 sets per day, __5-7_ days per week  Copyright  VHI. All rights reserved.  Short Arc Arrow ElectronicsQuad   Place a large can or rolled towel under leg. Straighten knee and leg. Hold __5__ seconds. Repeat with other leg. Repeat __10-20__ times. Do __2__ sessions per day.  http://gt2.exer.us/365   Copyright  VHI. All rights reserved.  Quad Set   Slowly tighten muscles on thigh of straight leg while counting out loud to ___5_. Repeat ___5-10_ times. Do __2__ sessions per day.  http://gt2.exer.us/361   Copyright  VHI. All rights reserved.

## 2014-12-19 ENCOUNTER — Ambulatory Visit: Payer: 59 | Admitting: Physical Therapy

## 2014-12-19 DIAGNOSIS — M25662 Stiffness of left knee, not elsewhere classified: Secondary | ICD-10-CM

## 2014-12-19 DIAGNOSIS — M25562 Pain in left knee: Secondary | ICD-10-CM

## 2014-12-19 DIAGNOSIS — R269 Unspecified abnormalities of gait and mobility: Secondary | ICD-10-CM

## 2014-12-19 NOTE — Therapy (Signed)
[name], [place], [address] Phone: [phone]   Fax:  [phone]  Physical Therapy Treatment  Patient Details  Name: Marie [name] MRN: [id number] Date of Birth: September 21, 1961 Referring Provider:  Mayra Neer, MD  Encounter Date: 12/19/2014      PT End of Session - 12/19/14 0913    Visit Number 2   Number of Visits 78   PT Start Time 0850      Past Medical History  Diagnosis Date  . Diabetes mellitus without complication     borderline, states she is using Victoza for weight loss, Rx by PCP  . Fibromyalgia     followed by Dr. Estanislado Pandy  . Arthritis     knees, knees, neck, shoulders, hands   . Hypertension     Past Surgical History  Procedure Laterality Date  . Myomectomy      several surgeries for this problem  . Abdominal hysterectomy  2000  . Tonsillectomy    . Total knee arthroplasty Left 11/25/2014    dr whitfield  . Total knee arthroplasty Left 11/25/2014    Procedure: TOTAL KNEE ARTHROPLASTY;  Surgeon: Garald Balding, MD;  Location: [place];  Service: Orthopedics;  Laterality: Left;    There were no vitals taken for this visit.  Visit Diagnosis:  Stiffness of joint, lower leg, left  Pain in knee joint, left  Abnormality of gait      Subjective Assessment - 12/19/14 0856    Symptoms A little stiffness today.  Did my exercises, having a little trouble with heel slides          [facility] PT Assessment - 12/19/14 0908    AROM   Left Knee Flexion 114             [facility] Adult PT Treatment/Exercise - 12/19/14 0902    Knee/Hip Exercises: Aerobic   Stationary Bike NuStep level 3, 5 min   Knee/Hip Exercises: Supine   Quad Sets Strengthening;Left;1 set;10 reps   Short Arc Quad Sets Strengthening;Left;1 set;20 reps   Heel Slides AAROM;Left;1 set;5 reps   Bridges Strengthening;1 set;10 reps   Straight Leg Raises Strengthening;Left;2 sets   Straight Leg Raises  Limitations 1 set with ER   Knee Flexion AAROM;Strengthening;2 sets   Knee Flexion Limitations ball roll   Cryotherapy   Number Minutes Cryotherapy 15 Minutes   Cryotherapy Location Knee   Type of Cryotherapy Ice pack;Other (comment)  vaso  mod compression coldest setting           PT Short Term Goals - 12/16/14 1527    PT SHORT TERM GOAL #1   Title Pt will be I with HEP and use if RICE for pain, edema   Time 2   Period Weeks   Status New   PT SHORT TERM GOAL #2   Title Pt will be able to flex knee to 115 deg with AAROM for improved transfers   Time 4   Period Weeks   Status New   PT SHORT TERM GOAL #3   Title Pt will be able to resume all household tasks unlimited by knee pain    Time 4   Period Weeks   Status New           PT Long Term Goals - 12/16/14 1529    PT LONG TERM GOAL #1   Title Pt will be I with advanced HEP for knee   Time 8   Period Weeks  Status New   PT LONG TERM GOAL #2   Title Pt will walk without cane for all mobilty in community   Time 8   Period Weeks   Status New   PT LONG TERM GOAL #3   Title Pt will report min occasional stiffness in knee at night for improved rest, sleep.    Time 8   Period Weeks   Status New   PT LONG TERM GOAL #4   Title Pt will be able to negotiate stairs with 1 rail reciprocally and no pain increase   Time 8   Period Weeks   Status New   PT LONG TERM GOAL #5   Title Pt will score <40% on FOTO for improved function overall   Time 8   Period Weeks   Status New               Plan - 12/19/14 0913    Clinical Impression Statement NO goals met, 2nd visit.  Notable increase in L knee flexion today, 114 deg.   PT Next Visit Plan cont with ROM, strength. Try more standing ex   PT Home Exercise Plan cont as previous   Consulted and Agree with Plan of Care Patient        Problem List Patient Active Problem List   Diagnosis Date Noted  . Primary osteoarthritis of left knee 11/25/2014  . Diabetes  mellitus type 2 in obese 11/25/2014  . Severe obesity (BMI >= 40) 11/25/2014  . Hypertension 11/25/2014  . S/P total knee replacement using cement 11/25/2014  . RUQ PAIN 03/14/2007    [name] 12/19/2014, 9:22 AM  [place], [place], [address] Phone: [phone]   Fax:  [phone]

## 2014-12-29 ENCOUNTER — Encounter: Payer: 59 | Admitting: Physical Therapy

## 2015-01-01 ENCOUNTER — Encounter: Payer: 59 | Admitting: Physical Therapy

## 2015-01-06 ENCOUNTER — Encounter: Payer: 59 | Admitting: Physical Therapy

## 2015-01-08 ENCOUNTER — Encounter: Payer: 59 | Admitting: Physical Therapy

## 2015-01-09 ENCOUNTER — Encounter: Payer: 59 | Admitting: Physical Therapy

## 2015-06-09 ENCOUNTER — Other Ambulatory Visit: Payer: Self-pay | Admitting: Family Medicine

## 2015-06-09 ENCOUNTER — Ambulatory Visit
Admission: RE | Admit: 2015-06-09 | Discharge: 2015-06-09 | Disposition: A | Discharge status: Home / Self Care | Payer: 59 | Source: Ambulatory Visit | Attending: Family Medicine | Admitting: Family Medicine

## 2015-06-09 DIAGNOSIS — I82402 Acute embolism and thrombosis of unspecified deep veins of left lower extremity: Secondary | ICD-10-CM

## 2015-07-08 ENCOUNTER — Encounter: Payer: Self-pay | Admitting: Physical Therapy

## 2015-07-08 NOTE — Therapy (Signed)
[place], [place], [address] Phone: [phone]   Fax:  [phone]  Patient Details  Name: Marie [name] MRN: [id number] Date of Birth: [birth year] Referring Provider:  No ref. provider found  Encounter Date: 07/08/2015     PT Short Term Goals - 12/16/14 1527    PT SHORT TERM GOAL #1   Title Pt will be I with HEP and use if RICE for pain, edema   Time 2   Period Weeks   Status New   PT SHORT TERM GOAL #2   Title Pt will be able to flex knee to 115 deg with AAROM for improved transfers   Time 4   Period Weeks   Status New   PT SHORT TERM GOAL #3   Title Pt will be able to resume all household tasks unlimited by knee pain    Time 4   Period Weeks   Status New           PT Long Term Goals - 12/16/14 1529    PT LONG TERM GOAL #1   Title Pt will be I with advanced HEP for knee   Time 8   Period Weeks   Status New   PT LONG TERM GOAL #2   Title Pt will walk without cane for all mobilty in community   Time 8   Period Weeks   Status New   PT LONG TERM GOAL #3   Title Pt will report min occasional stiffness in knee at night for improved rest, sleep.    Time 8   Period Weeks   Status New   PT LONG TERM GOAL #4   Title Pt will be able to negotiate stairs with 1 rail reciprocally and no pain increase   Time 8   Period Weeks   Status New   PT LONG TERM GOAL #5   Title Pt will score <40% on FOTO for improved function overall   Time 8   Period Weeks   Status New                  PHYSICAL THERAPY DISCHARGE SUMMARY  Visits from Start of Care: 2  Current functional level related to goals / functional outcomes: Goals above not met as of last visit, did not return.    Remaining deficits: Deficits unknown, did not complete PT episode or discharge visit.     Education / Equipment: HEP, RICE, PT/POC  Plan: Patient agrees to discharge.  Patient goals were not met. Patient is being discharged due to not returning since the last visit.  ?????      Cyrstal Leitz 07/08/2015, 3:10 PM  [address] [place], [place], [address]   Jairo Bellew, [place] 07/08/2015 3:12 PM Phone: [phone] Fax: [phone]  Phone: [phone]   Fax:  [phone]

## 2015-07-23 ENCOUNTER — Other Ambulatory Visit (HOSPITAL_COMMUNITY): Payer: Self-pay | Admitting: Family Medicine

## 2015-07-23 DIAGNOSIS — R600 Localized edema: Secondary | ICD-10-CM

## 2015-07-27 ENCOUNTER — Ambulatory Visit: Payer: 59 | Attending: Rheumatology | Admitting: Physical Therapy

## 2015-07-27 DIAGNOSIS — M2560 Stiffness of unspecified joint, not elsewhere classified: Secondary | ICD-10-CM | POA: Insufficient documentation

## 2015-07-27 DIAGNOSIS — Z7409 Other reduced mobility: Secondary | ICD-10-CM | POA: Insufficient documentation

## 2015-07-27 DIAGNOSIS — M5442 Lumbago with sciatica, left side: Secondary | ICD-10-CM | POA: Diagnosis present

## 2015-07-27 DIAGNOSIS — M25562 Pain in left knee: Secondary | ICD-10-CM | POA: Diagnosis present

## 2015-07-27 DIAGNOSIS — M5386 Other specified dorsopathies, lumbar region: Secondary | ICD-10-CM

## 2015-07-27 DIAGNOSIS — M25662 Stiffness of left knee, not elsewhere classified: Secondary | ICD-10-CM | POA: Diagnosis present

## 2015-07-27 DIAGNOSIS — R269 Unspecified abnormalities of gait and mobility: Secondary | ICD-10-CM | POA: Diagnosis present

## 2015-07-27 NOTE — Therapy (Signed)
[facility] [address] [place], [place], [address] Phone: [phone]   Fax:  [phone]  Physical Therapy Evaluation  Patient Details  Name: Marie [name] MRN: [id number] Date of Birth: 12-14-1960 Referring Katoya Amato:  Pollyann Savoy, MD  Encounter Date: 07/27/2015      PT End of Session - 07/27/15 1515    Visit Number 1   Number of Visits 8   Date for PT Re-Evaluation 08/31/15   PT Start Time 1415   PT Stop Time 1510   PT Time Calculation (min) 55 min   Activity Tolerance Patient tolerated treatment well      Past Medical History  Diagnosis Date  . Diabetes mellitus without complication     borderline, states she is using Victoza for weight loss, Rx by PCP  . Fibromyalgia     followed by Dr. Corliss Skains  . Arthritis     knees, knees, neck, shoulders, hands   . Hypertension     Past Surgical History  Procedure Laterality Date  . Myomectomy      several surgeries for this problem  . Abdominal hysterectomy  2000  . Tonsillectomy    . Total knee arthroplasty Left 11/25/2014    dr whitfield  . Total knee arthroplasty Left 11/25/2014    Procedure: TOTAL KNEE ARTHROPLASTY;  Surgeon: Valeria Batman, MD;  [facility]: [facility] OR;  Service: Orthopedics;  Laterality: Left;    There were no vitals filed for this visit.  Visit Diagnosis:  Impaired functional mobility and endurance  Midline low back pain with left-sided sciatica  Decreased range of motion of lumbar spine  Knee stiffness, left      Subjective Assessment - 07/27/15 1424    Subjective Pain is in low back and extends into bilateral hips.  Some residual stiffness in L Knee.  She has numbness and tingling in LLE (to her L foot).  She has diff standing, walking for long periods.    Pertinent History L TKA 11/25/14, fibromyalgia, obesity, diabetes   Limitations House hold activities;Standing;Walking;Lifting;Other (comment)  sits at a desk all day   How  long can you sit comfortably? not limited   How long can you stand comfortably? 10-15 min   How long can you walk comfortably? not limited    Diagnostic tests none    Patient Stated Goals be able to stand without pain.    Currently in Pain? Yes   Pain Score 3    Pain Location Back   Pain Orientation Lower   Pain Descriptors / Indicators Sore;Aching   Pain Type Chronic pain   Pain Radiating Towards hips and LLE   Pain Onset More than a month ago   Pain Frequency Intermittent   Aggravating Factors  standing still    Pain Relieving Factors sitting, rest, walking a bit, meds   Effect of Pain on Daily Activities would like to be more active   Multiple Pain Sites No            [facility] PT Assessment - 07/27/15 1431    Assessment   Medical Diagnosis back pain    Onset Date/Surgical Date --  chronic   Prior Therapy Yes for TKA   Precautions   Precautions None   Restrictions   Weight Bearing Restrictions No   Balance Screen   Has the patient fallen in the past 6 months No   Home Environment   Living Environment Private residence   Living Arrangements Spouse/significant other  Type of Home House   Home Access Stairs to enter   Prior Function   Level of Independence Independent   Vocation Full time employment   Vocation Requirements sedentary   Leisure family   Cognition   Overall Cognitive Status Within Functional Limits for tasks assessed   Observation/Other Assessments   Focus on Therapeutic Outcomes (FOTO)  39%   Sensation   Light Touch Appears Intact   Coordination   Gross Motor Movements are Fluid and Coordinated Not tested   Posture/Postural Control   Postural Limitations Rounded Shoulders;Forward head   AROM   Lumbar Flexion WFL   Lumbar Extension 25% discomfort   Lumbar - Right Side Bend 25%   Lumbar - Left Side Bend 25%   Lumbar - Right Rotation 25%   Lumbar - Left Rotation 50%   Strength   Right Hip Flexion 4+/5   Right Hip ABduction 4+/5   Left Hip  Flexion 4/5   Left Hip ABduction 4+/5   Right/Left Knee --  5/5   Right/Left Ankle --  DF 5/5   Flexibility   Soft Tissue Assessment /Muscle Length yes   Hamstrings tight  lacks full L knee ext   Quadriceps tight   ITB Rt tighter than L    Palpation   Palpation comment did not ilicit pain with palpation to L spine in standing and sidelying  tender towards superior lat. gluteals   Straight Leg Raise   Findings Negative   Side  Left  bilat.            [facility] Adult PT Treatment/Exercise - 07/27/15 1431    Self-Care   RICE ice pack   Other Self-Care Comments  taking standing, walk breaks at work   Lumbar Exercises: Programme researcher, broadcasting/film/video 2 reps;30 seconds   Active Hamstring Stretch Limitations HEP   Single Knee to Chest Stretch 2 reps;30 seconds   Single Knee to Chest Stretch Limitations HEP   Lower Trunk Rotation 5 reps;10 seconds   Hip Flexor Stretch 1 rep;60 seconds   Hip Flexor Stretch Limitations HEP   Pelvic Tilt 5 reps;10 seconds   Pelvic Tilt Limitations HEP   Modalities   Modalities Cryotherapy   Cryotherapy   Number Minutes Cryotherapy 10 Minutes   Cryotherapy Location Lumbar Spine   Type of Cryotherapy Ice pack                PT Education - 07/27/15 1515    Education provided Yes   Education Details PT/POC, HEP   Person(s) Educated Patient   Methods Explanation;Handout   Comprehension Verbalized understanding;Need further instruction             PT Long Term Goals - 07/27/15 1520    PT LONG TERM GOAL #1   Title Pt will be I with advanced HEP for lumbar /core   Time 5   Period Weeks   Status New   PT LONG TERM GOAL #2   Title Pt will be able to stand as long as needed for housework (including dishes) without limitation of pain   Time 5   Period Weeks   PT LONG TERM GOAL #3   Title Pt will report taking more frequent standing/walking breaks at work to ease stiffness and compression in spine.    Time 5   Period Weeks    Status New   PT LONG TERM GOAL #4   Title Patient will be able to lift, transfer, carry light to  mod objects with good body mechanics to preserve spine.    Time 5   Period Weeks   Status New               Plan - 07/27/15 1516    Clinical Impression Statement Pt presents as deconditioned with spine stiffness and weak hip/core musculature.  She has altered her activity and walking patterns since her surgery (become less active). She has likley compensated with abnormal movement patterns which may strain L spine to a degreee.  She reported no pain after short stretching session.    Pt will benefit from skilled therapeutic intervention in order to improve on the following deficits Abnormal gait;Decreased strength;Improper body mechanics;Difficulty walking;Decreased activity tolerance;Decreased mobility;Impaired flexibility;Obesity;Decreased range of motion;Decreased endurance;Increased fascial restricitons   Rehab Potential Good   PT Frequency 2x / week   PT Duration 4 weeks  allow 5 weeks for schedule   PT Treatment/Interventions ADLs/Self Care Home Management;Functional mobility training;Manual techniques;Therapeutic activities;Electrical Stimulation;Cryotherapy;Therapeutic exercise;Neuromuscular re-education;Ultrasound;Patient/family education   PT Next Visit Plan check HEP and put in Epic? begin stab, NuStep and reinforce walkoing, normal activities   PT Home Exercise Plan Post tilt, Hamstring, ant hip stretch, single knee to chest   Consulted and Agree with Plan of Care Patient         Problem List Patient Active Problem List   Diagnosis Date Noted  . Primary osteoarthritis of left knee 11/25/2014  . Diabetes mellitus type 2 in obese 11/25/2014  . Severe obesity (BMI >= 40) 11/25/2014  . Hypertension 11/25/2014  . S/P total knee replacement using cement 11/25/2014  . RUQ PAIN 03/14/2007    PAA,JENNIFER 07/27/2015, 3:25 PM  [facility]  [facility] [address] [place], [place], [address] Phone: [phone]   Fax:  [phone]  Karie Mainland, PT 07/27/2015 3:25 PM Phone: [phone] Fax: [phone]

## 2015-07-27 NOTE — Patient Instructions (Signed)
HEP given, [employer] was down, gave pt. Paper copy of Ant hip stretch (off side of table), single knee to chest, and hamstrings

## 2015-07-29 ENCOUNTER — Ambulatory Visit (HOSPITAL_COMMUNITY): Payer: 59 | Attending: Cardiology

## 2015-07-29 ENCOUNTER — Other Ambulatory Visit: Payer: Self-pay

## 2015-07-29 DIAGNOSIS — Z6841 Body Mass Index (BMI) 40.0 and over, adult: Secondary | ICD-10-CM | POA: Insufficient documentation

## 2015-07-29 DIAGNOSIS — R609 Edema, unspecified: Secondary | ICD-10-CM | POA: Diagnosis present

## 2015-07-29 DIAGNOSIS — I071 Rheumatic tricuspid insufficiency: Secondary | ICD-10-CM | POA: Diagnosis not present

## 2015-07-29 DIAGNOSIS — I351 Nonrheumatic aortic (valve) insufficiency: Secondary | ICD-10-CM | POA: Insufficient documentation

## 2015-07-29 DIAGNOSIS — E785 Hyperlipidemia, unspecified: Secondary | ICD-10-CM | POA: Diagnosis not present

## 2015-07-29 DIAGNOSIS — R600 Localized edema: Secondary | ICD-10-CM

## 2015-07-29 DIAGNOSIS — R7309 Other abnormal glucose: Secondary | ICD-10-CM | POA: Insufficient documentation

## 2015-07-29 DIAGNOSIS — I10 Essential (primary) hypertension: Secondary | ICD-10-CM | POA: Diagnosis not present

## 2015-07-30 ENCOUNTER — Ambulatory Visit: Payer: 59 | Admitting: Physical Therapy

## 2015-07-30 DIAGNOSIS — Z7409 Other reduced mobility: Secondary | ICD-10-CM

## 2015-07-30 DIAGNOSIS — M25662 Stiffness of left knee, not elsewhere classified: Secondary | ICD-10-CM

## 2015-07-30 DIAGNOSIS — M5442 Lumbago with sciatica, left side: Secondary | ICD-10-CM

## 2015-07-30 DIAGNOSIS — R269 Unspecified abnormalities of gait and mobility: Secondary | ICD-10-CM

## 2015-07-30 DIAGNOSIS — M25562 Pain in left knee: Secondary | ICD-10-CM

## 2015-07-30 DIAGNOSIS — M5386 Other specified dorsopathies, lumbar region: Secondary | ICD-10-CM

## 2015-07-30 NOTE — Patient Instructions (Addendum)
Images not populating..the patient given pictures from cabinet including pelvic tilt and shoulder bridge 10 each 2 times per day

## 2015-07-30 NOTE — Therapy (Signed)
[facility] Outpatient Rehabilitation [facility] [address] [place], [place], [address] Phone: [phone]   Fax:  [phone]  Physical Therapy Treatment  Patient Details  Name: Marie [name] MRN: [id number] Date of Birth: 06-06-1961 Referring Provider:  Lupita Raider, MD  Encounter Date: 07/30/2015      PT End of Session - 07/30/15 1505    Visit Number 2   Number of Visits 8   Date for PT Re-Evaluation 08/31/15   PT Start Time 0303   PT Stop Time 0353   PT Time Calculation (min) 50 min      Past Medical History  Diagnosis Date  . Diabetes mellitus without complication     borderline, states she is using Victoza for weight loss, Rx by PCP  . Fibromyalgia     followed by Dr. Corliss Skains  . Arthritis     knees, knees, neck, shoulders, hands   . Hypertension     Past Surgical History  Procedure Laterality Date  . Myomectomy      several surgeries for this problem  . Abdominal hysterectomy  2000  . Tonsillectomy    . Total knee arthroplasty Left 11/25/2014    dr whitfield  . Total knee arthroplasty Left 11/25/2014    Procedure: TOTAL KNEE ARTHROPLASTY;  Surgeon: Valeria Batman, MD;  Location: [facility] OR;  Service: Orthopedics;  Laterality: Left;    There were no vitals filed for this visit.  Visit Diagnosis:  Impaired functional mobility and endurance  Midline low back pain with left-sided sciatica  Decreased range of motion of lumbar spine  Knee stiffness, left  Stiffness of joint, lower leg, left  Pain in knee joint, left  Abnormality of gait      Subjective Assessment - 07/30/15 1505    Currently in Pain? No/denies                         [facility] Adult PT Treatment/Exercise - 07/30/15 0001    Lumbar Exercises: Stretches   Active Hamstring Stretch 3 reps;30 seconds   Single Knee to Chest Stretch 3 reps;30 seconds   Hip Flexor Stretch 1 rep;60 seconds   Pelvic Tilt 5 reps;10 seconds   Pelvic Tilt Limitations HEP    Lumbar Exercises: Supine   Bridge 10 reps   Bridge Limitations shoulder bridge   Cryotherapy   Number Minutes Cryotherapy 10 Minutes   Cryotherapy Location Lumbar Spine   Type of Cryotherapy Ice pack                PT Education - 07/30/15 1538    Education provided Yes   Education Details pelvic tilt and shoulder bridge   Person(s) Educated Patient   Methods Explanation;Handout   Comprehension Verbalized understanding             PT Long Term Goals - 07/27/15 1520    PT LONG TERM GOAL #1   Title Pt will be I with advanced HEP for lumbar /core   Time 5   Period Weeks   Status New   PT LONG TERM GOAL #2   Title Pt will be able to stand as long as needed for housework (including dishes) without limitation of pain   Time 5   Period Weeks   PT LONG TERM GOAL #3   Title Pt will report taking more frequent standing/walking breaks at work to ease stiffness and compression in spine.    Time 5   Period Weeks  Status New   PT LONG TERM GOAL #4   Title Patient will be able to lift, transfer, carry light to mod objects with good body mechanics to preserve spine.    Time 5   Period Weeks   Status New               Plan - 07/30/15 1550    Clinical Impression Statement Pt reports compliance with HEP, no pain, the strethches are feeling good and she is independent. Instructed pt in lumbar stabilization including pelvic tilt and shoulder bridge for HEP   PT Next Visit Plan review pelvic tilit, bridge and progress, continue Nustep        Problem List Patient Active Problem List   Diagnosis Date Noted  . Primary osteoarthritis of left knee 11/25/2014  . Diabetes mellitus type 2 in obese 11/25/2014  . Severe obesity (BMI >= 40) 11/25/2014  . Hypertension 11/25/2014  . S/P total knee replacement using cement 11/25/2014  . RUQ PAIN 03/14/2007    Sherrie Mustache, PTA 07/30/2015, 4:12 PM  [facility] [address] [place], [place], [address] Phone: [phone]   Fax:  [phone]

## 2015-08-03 ENCOUNTER — Ambulatory Visit: Payer: 59 | Attending: Rheumatology | Admitting: Physical Therapy

## 2015-08-03 DIAGNOSIS — R269 Unspecified abnormalities of gait and mobility: Secondary | ICD-10-CM | POA: Diagnosis present

## 2015-08-03 DIAGNOSIS — Z7409 Other reduced mobility: Secondary | ICD-10-CM

## 2015-08-03 DIAGNOSIS — M25662 Stiffness of left knee, not elsewhere classified: Secondary | ICD-10-CM

## 2015-08-03 DIAGNOSIS — M5442 Lumbago with sciatica, left side: Secondary | ICD-10-CM | POA: Diagnosis present

## 2015-08-03 DIAGNOSIS — M2560 Stiffness of unspecified joint, not elsewhere classified: Secondary | ICD-10-CM | POA: Insufficient documentation

## 2015-08-03 DIAGNOSIS — M5386 Other specified dorsopathies, lumbar region: Secondary | ICD-10-CM

## 2015-08-03 DIAGNOSIS — M25562 Pain in left knee: Secondary | ICD-10-CM | POA: Diagnosis present

## 2015-08-03 NOTE — Therapy (Signed)
[facility] Outpatient Rehabilitation [facility] [address] [place], [place], [address] Phone: [phone]   Fax:  [phone]  Physical Therapy Treatment  Patient Details  Name: Marie [name] MRN: [id number] Date of Birth: 05/11/1961 Referring Provider:  Lupita Raider, MD  Encounter Date: 08/03/2015      PT End of Session - 08/03/15 1421    Visit Number 3   Number of Visits 8   Date for PT Re-Evaluation 08/31/15   PT Start Time 0215   PT Stop Time 0258   PT Time Calculation (min) 43 min      Past Medical History  Diagnosis Date  . Diabetes mellitus without complication     borderline, states she is using Victoza for weight loss, Rx by PCP  . Fibromyalgia     followed by Dr. Corliss Skains  . Arthritis     knees, knees, neck, shoulders, hands   . Hypertension     Past Surgical History  Procedure Laterality Date  . Myomectomy      several surgeries for this problem  . Abdominal hysterectomy  2000  . Tonsillectomy    . Total knee arthroplasty Left 11/25/2014    dr whitfield  . Total knee arthroplasty Left 11/25/2014    Procedure: TOTAL KNEE ARTHROPLASTY;  Surgeon: Valeria Batman, MD;  Location: [facility] OR;  Service: Orthopedics;  Laterality: Left;    There were no vitals filed for this visit.  Visit Diagnosis:  Impaired functional mobility and endurance  Midline low back pain with left-sided sciatica  Decreased range of motion of lumbar spine  Knee stiffness, left  Stiffness of joint, lower leg, left  Pain in knee joint, left  Abnormality of gait      Subjective Assessment - 08/03/15 1419    Currently in Pain? Yes   Aggravating Factors  standing in line, standing in one place   Pain Relieving Factors sitting, resting, walking a bit, meds                         [facility] Adult PT Treatment/Exercise - 08/03/15 0001    Self-Care   Self-Care Lifting   Lifting Pt returned demonstration of proper lifting mechanics floor to  waist as well as review of carrying, transfering objects    Lumbar Exercises: Stretches   Active Hamstring Stretch 3 reps;30 seconds   Single Knee to Chest Stretch 3 reps;30 seconds   Hip Flexor Stretch 1 rep;60 seconds   Pelvic Tilt Other (comment)   Pelvic Tilt Limitations 10 reps 5 sec, 6 reps 10 secs- cues for breathing and technique reqd.    Lumbar Exercises: Standing   Other Standing Lumbar Exercises 3 way hip bilateral with cues for posture and Tra activation   Other Standing Lumbar Exercises wall slides with pelvic tilt  x 10   Lumbar Exercises: Supine   Clam 10 reps   Bridge 10 reps   Bridge Limitations shoulder bridge                PT Education - 08/03/15 1441    Education provided Yes   Education Details wall slides, pelvic tilt with clams   Person(s) Educated Patient   Methods Explanation;Handout   Comprehension Verbalized understanding             PT Long Term Goals - 08/03/15 1447    PT LONG TERM GOAL #1   Title Pt will be I with advanced HEP for lumbar /core  Time 5   Period Weeks   Status On-going   PT LONG TERM GOAL #2   Title Pt will be able to stand as long as needed for housework (including dishes) without limitation of pain   Time 5   Period Weeks   Status On-going   PT LONG TERM GOAL #3   Title Pt will report taking more frequent standing/walking breaks at work to ease stiffness and compression in spine.    Time 5   Period Weeks   Status On-going   PT LONG TERM GOAL #4   Title Patient will be able to lift, transfer, carry light to mod objects with good body mechanics to preserve spine.    Time 5   Period Weeks   Status On-going   PT LONG TERM GOAL #5   Title Pt will score <40% on FOTO for improved function overall   Time 8   Period Weeks   Status On-going               Plan - 08/03/15 1500    Clinical Impression Statement Pt reprots no pain today and she has not recently had to stand in one place which increases her  back pain. Review of pelvic tilt and shoulder bridge with addition of clams to pelvic tilt and wall slides for HEP. Brief review of lifting mechanics with cues to maintain lordosis and posture.    PT Next Visit Plan review HEP and progress standing ex as tolerated        Problem List Patient Active Problem List   Diagnosis Date Noted  . Primary osteoarthritis of left knee 11/25/2014  . Diabetes mellitus type 2 in obese (HCC) 11/25/2014  . Severe obesity (BMI >= 40) (HCC) 11/25/2014  . Hypertension 11/25/2014  . S/P total knee replacement using cement 11/25/2014  . RUQ PAIN 03/14/2007    Sherrie Mustache, PTA 08/03/2015, 3:01 PM  [facility] [address] [place], [place], [address] Phone: [phone]   Fax:  [phone]

## 2015-08-03 NOTE — Patient Instructions (Addendum)
  Strengthening: Wall Slide   Draw in abdominals. Leaning on wall, slowly lower buttocks until thighs are parallel to floor. Hold __5__ seconds. Tighten thigh muscles and return. Repeat __10__ times per set. Do __1-2__ sets per session. Do __2__ sessions per day.  http://orth.exer.us/630   Copyright  VHI. All rights reserved.    PELVIC TILT WITH CLAM- SEE HANDOUT REPEAT 10 times, 2 times per day.

## 2015-08-10 ENCOUNTER — Ambulatory Visit: Payer: 59 | Admitting: Physical Therapy

## 2015-08-10 DIAGNOSIS — Z7409 Other reduced mobility: Secondary | ICD-10-CM

## 2015-08-10 DIAGNOSIS — M5386 Other specified dorsopathies, lumbar region: Secondary | ICD-10-CM

## 2015-08-10 DIAGNOSIS — M5442 Lumbago with sciatica, left side: Secondary | ICD-10-CM

## 2015-08-10 DIAGNOSIS — R269 Unspecified abnormalities of gait and mobility: Secondary | ICD-10-CM

## 2015-08-10 DIAGNOSIS — M25562 Pain in left knee: Secondary | ICD-10-CM

## 2015-08-10 DIAGNOSIS — M25662 Stiffness of left knee, not elsewhere classified: Secondary | ICD-10-CM

## 2015-08-10 NOTE — Therapy (Addendum)
[facility] Outpatient Rehabilitation [facility] [address] [place], [place], [address] Phone: [phone]   Fax:  [phone]  Physical Therapy Treatment  Patient Details  Name: Marie [name] MRN: [id number] Date of Birth: 1961/05/23 Referring Provider:  Pollyann Savoy, MD  Encounter Date: 08/10/2015      PT End of Session - 08/10/15 1410    Visit Number 4   Number of Visits 8   Date for PT Re-Evaluation 08/31/15   PT Start Time 0210   PT Stop Time 0258   PT Time Calculation (min) 48 min      Past Medical History  Diagnosis Date  . Diabetes mellitus without complication     borderline, states she is using Victoza for weight loss, Rx by PCP  . Fibromyalgia     followed by Dr. Corliss Skains  . Arthritis     knees, knees, neck, shoulders, hands   . Hypertension     Past Surgical History  Procedure Laterality Date  . Myomectomy      several surgeries for this problem  . Abdominal hysterectomy  2000  . Tonsillectomy    . Total knee arthroplasty Left 11/25/2014    dr whitfield  . Total knee arthroplasty Left 11/25/2014    Procedure: TOTAL KNEE ARTHROPLASTY;  Surgeon: Valeria Batman, MD;  Location: [facility] OR;  Service: Orthopedics;  Laterality: Left;    There were no vitals filed for this visit.  Visit Diagnosis:  Impaired functional mobility and endurance  Midline low back pain with left-sided sciatica  Decreased range of motion of lumbar spine  Knee stiffness, left  Stiffness of joint, lower leg, left  Pain in knee joint, left  Abnormality of gait      Subjective Assessment - 08/10/15 1418    Subjective No back pain. left knee has been hurting some.    Currently in Pain? No/denies   Aggravating Factors  standing in one place   Pain Relieving Factors sitting, walking                         [facility] Adult PT Treatment/Exercise - 08/10/15 0001    Lumbar Exercises: Stretches   Active Hamstring Stretch 3 reps;30 seconds    Single Knee to Chest Stretch 3 reps;30 seconds   Lower Trunk Rotation 5 reps;10 seconds   Hip Flexor Stretch 1 rep;60 seconds   Pelvic Tilt Other (comment)   Pelvic Tilt Limitations 10 reps 5 sec, 6 reps 10 secs- cues for breathing and technique reqd.    Lumbar Exercises: Aerobic   Stationary Bike [place] L3 LE only x 5 min   Lumbar Exercises: Standing   Other Standing Lumbar Exercises 3 way hip bilateral with cues for posture and Tra activation   Other Standing Lumbar Exercises wall slides with pelvic tilt  x 10   Lumbar Exercises: Supine   Clam 20 reps   Clam Limitations with pelvic tilt and green band   Heel Slides 10 reps;20 reps   Bent Knee Raise 20 reps   Bridge 10 reps   Bridge Limitations added clam with green band x 10   Straight Leg Raise 10 reps   Cryotherapy   Number Minutes Cryotherapy 10 Minutes   Cryotherapy Location Lumbar Spine   Type of Cryotherapy Ice pack                     PT Long Term Goals - 08/10/15 1414    PT  LONG TERM GOAL #1   Title Pt will be I with advanced HEP for lumbar /core   Time 5   Period Weeks   Status On-going   PT LONG TERM GOAL #2   Title Pt will be able to stand as long as needed for housework (including dishes) without limitation of pain   Time 5   Period Weeks   Status On-going   PT LONG TERM GOAL #3   Title Pt will report taking more frequent standing/walking breaks at work to ease stiffness and compression in spine.    Time 5   Period Weeks   Status On-going   PT LONG TERM GOAL #4   Title Patient will be able to lift, transfer, carry light to mod objects with good body mechanics to preserve spine.    Time 5   Period Weeks   Status Achieved   PT LONG TERM GOAL #5   Title Pt will score <40% on FOTO for improved function overall   Period Weeks   Status Unable to assess               Plan - 08/10/15 1413    Clinical Impression Statement Pt reports improved tolerance for standing to 10 minutes  (initially 2 minutes). She is able to take a seated rest break for 2 minutes and retrun to standing activites for another 10 minutes. She can return demonstrtae proper body mechanics with lifting/carrying items. LTG# 4 achieved.    PT Next Visit Plan review HEP and progress standing ex as tolerated, mayu try prone- ?quadruped due to knee repleacement        Problem List Patient Active Problem List   Diagnosis Date Noted  . Primary osteoarthritis of left knee 11/25/2014  . Diabetes mellitus type 2 in obese (HCC) 11/25/2014  . Severe obesity (BMI >= 40) (HCC) 11/25/2014  . Hypertension 11/25/2014  . S/P total knee replacement using cement 11/25/2014  . RUQ PAIN 03/14/2007    Sherrie Mustache, PTA 08/10/2015, 2:50 PM  [facility] [address] [place], [place], [address] Phone: [phone]   Fax:  [phone]

## 2015-08-12 ENCOUNTER — Ambulatory Visit: Payer: 59 | Admitting: Physical Therapy

## 2015-08-12 DIAGNOSIS — M25562 Pain in left knee: Secondary | ICD-10-CM

## 2015-08-12 DIAGNOSIS — R269 Unspecified abnormalities of gait and mobility: Secondary | ICD-10-CM

## 2015-08-12 DIAGNOSIS — M5442 Lumbago with sciatica, left side: Secondary | ICD-10-CM

## 2015-08-12 DIAGNOSIS — Z7409 Other reduced mobility: Secondary | ICD-10-CM | POA: Diagnosis not present

## 2015-08-12 DIAGNOSIS — M5386 Other specified dorsopathies, lumbar region: Secondary | ICD-10-CM

## 2015-08-12 DIAGNOSIS — M25662 Stiffness of left knee, not elsewhere classified: Secondary | ICD-10-CM

## 2015-08-12 NOTE — Therapy (Signed)
Marie [name], [place], [address] Phone: [phone]   Fax:  [phone]  Physical Therapy Treatment  Patient Details  Name: Marie [name] MRN: [id number] Date of Birth: 1961/10/02 Referring Provider:  Mayra Neer, MD  Encounter Date: 08/12/2015      PT End of Session - 08/12/15 1432    Visit Number 5   Number of Visits 8   Date for PT Re-Evaluation 08/31/15   PT Start Time 3818   PT Stop Time 1508   PT Time Calculation (min) 48 min   Activity Tolerance Patient tolerated treatment well   Behavior During Therapy [facility] for tasks assessed/performed      Past Medical History  Diagnosis Date  . Diabetes mellitus without complication     borderline, states she is using Victoza for weight loss, Rx by PCP  . Fibromyalgia     followed by Dr. Estanislado [name]  . Arthritis     knees, knees, neck, shoulders, hands   . Hypertension     Past Surgical History  Procedure Laterality Date  . Myomectomy      several surgeries for this problem  . Abdominal hysterectomy  2000  . Tonsillectomy    . Total knee arthroplasty Left 11/25/2014    dr [name]  . Total knee arthroplasty Left 11/25/2014    Procedure: TOTAL KNEE ARTHROPLASTY;  Surgeon: Marie Balding, MD;  Location: [facility];  Service: Orthopedics;  Laterality: Left;    There were no vitals filed for this visit.  Visit Diagnosis:  Impaired functional mobility and endurance  Midline low back pain with left-sided sciatica  Decreased range of motion of lumbar spine  Knee stiffness, left  Stiffness of joint, lower leg, left  Pain in knee joint, left  Abnormality of gait      Subjective Assessment - 08/12/15 1424    Subjective No pain today in back or in knee.  Knee feels stiff, askes when this will go away. Was out yesterday for awhile and my back hurt a littel bit, went away with rest.    Currently in Pain? No/denies           [facility] Adult PT  Treatment/Exercise - 08/12/15 1433    Lumbar Exercises: Stretches   Active Hamstring Stretch 3 reps;30 seconds   Single Knee to Chest Stretch 3 reps;30 seconds   Lower Trunk Rotation --  x 10    Pelvic Tilt Limitations difficulty but can bridge   Lumbar Exercises: Aerobic   Stationary Bike NuStep LEs only level 4 for 6 min   Lumbar Exercises: Standing   Heel Raises 20 reps   Functional Squats 20 reps   Functional Squats Limitations on foam    Other Standing Lumbar Exercises "sumo squat" x 10    Other Standing Lumbar Exercises 2 way hip on foam abd and ext x 10   Lumbar Exercises: Supine   Clam 10 reps   Bridge 10 reps   Knee/Hip Exercises: Stretches   Other Knee/Hip Stretches patellar mobilizations to ease knee stifffness, very hypomobile   Cryotherapy   Number Minutes Cryotherapy 10 Minutes   Cryotherapy Location Lumbar Spine   Type of Cryotherapy Ice pack      Wall squats x 15 good alignment          PT Education - 08/12/15 1432    Education provided Yes   Education Details review HEP   Person(s) Educated Patient   Methods Explanation   Comprehension  Verbalized understanding;Returned demonstration             PT Long Term Goals - 08/12/15 1444    PT LONG TERM GOAL #1   Title Pt will be I with advanced HEP for lumbar /core   Status On-going   PT LONG TERM GOAL #2   Title Pt will be able to stand as long as needed for housework (including dishes) without limitation of pain   Baseline improved but still bothers her if over 15 min    Status Partially Met   PT LONG TERM GOAL #3   Title Pt will report taking more frequent standing/walking breaks at work to ease stiffness and compression in spine.    Baseline lunch and breaks    Status Partially Met   PT LONG TERM GOAL #4   Title Patient will be able to lift, transfer, carry light to mod objects with good body mechanics to preserve spine.    Status Achieved   PT LONG TERM GOAL #5   Title Pt will score <40%  on FOTO for improved function overall   Status Unable to assess               Plan - 08/12/15 1503    Clinical Impression Statement Worked today on closed chain exercises emphasizing core support. Also provided patellar mobs to improve her flexibility in knee flexion.  Sheis progressing well and will likely be ready for DC in the next few visits.    PT Next Visit Plan review HEP and progress standing ex as tolerated, standing traction stretch    PT Home Exercise Plan Post tilt, Hamstring, ant hip stretch, single knee to chest, wall slides added patellar mobs   Consulted and Agree with Plan of Care Patient        Problem List Patient Active Problem List   Diagnosis Date Noted  . Primary osteoarthritis of left knee 11/25/2014  . Diabetes mellitus type 2 in obese ([place]) 11/25/2014  . Severe obesity (BMI >= 40) ([place]) 11/25/2014  . Hypertension 11/25/2014  . S/P total knee replacement using cement 11/25/2014  . RUQ PAIN 03/14/2007    Marie [name] 08/12/2015, 3:07 PM  [facility] [address] [place], [place], [address] Phone: [phone]   Fax:  [phone]    Marie [name], PT 08/12/2015 3:07 PM Phone: [phone] Fax: [phone]

## 2015-08-17 ENCOUNTER — Ambulatory Visit: Payer: 59 | Admitting: Physical Therapy

## 2015-08-17 DIAGNOSIS — R269 Unspecified abnormalities of gait and mobility: Secondary | ICD-10-CM

## 2015-08-17 DIAGNOSIS — M5442 Lumbago with sciatica, left side: Secondary | ICD-10-CM

## 2015-08-17 DIAGNOSIS — Z7409 Other reduced mobility: Secondary | ICD-10-CM | POA: Diagnosis not present

## 2015-08-17 DIAGNOSIS — M25562 Pain in left knee: Secondary | ICD-10-CM

## 2015-08-17 DIAGNOSIS — M5386 Other specified dorsopathies, lumbar region: Secondary | ICD-10-CM

## 2015-08-17 DIAGNOSIS — M25662 Stiffness of left knee, not elsewhere classified: Secondary | ICD-10-CM

## 2015-08-17 NOTE — Therapy (Signed)
[facility] Outpatient Rehabilitation [facility] [address] [place], [place], [address] Phone: [phone]   Fax:  [phone]  Physical Therapy Treatment  Patient Details  Name: Marie [name] MRN: [id number] Date of Birth: July 15, 1961 No Data Recorded  Encounter Date: 08/17/2015      PT End of Session - 08/17/15 1402    Visit Number 6   Number of Visits 8   Date for PT Re-Evaluation 08/31/15   PT Start Time 0132   PT Stop Time 0210   PT Time Calculation (min) 38 min      Past Medical History  Diagnosis Date  . Diabetes mellitus without complication     borderline, states she is using Victoza for weight loss, Rx by PCP  . Fibromyalgia     followed by Dr. Corliss Skains  . Arthritis     knees, knees, neck, shoulders, hands   . Hypertension     Past Surgical History  Procedure Laterality Date  . Myomectomy      several surgeries for this problem  . Abdominal hysterectomy  2000  . Tonsillectomy    . Total knee arthroplasty Left 11/25/2014    dr whitfield  . Total knee arthroplasty Left 11/25/2014    Procedure: TOTAL KNEE ARTHROPLASTY;  Surgeon: Valeria Batman, MD;  Location: [facility] OR;  Service: Orthopedics;  Laterality: Left;    There were no vitals filed for this visit.  Visit Diagnosis:  Impaired functional mobility and endurance  Midline low back pain with left-sided sciatica  Decreased range of motion of lumbar spine  Knee stiffness, left  Stiffness of joint, lower leg, left  Pain in knee joint, left  Abnormality of gait      Subjective Assessment - 08/17/15 1404    Subjective No pain my back seems to be doing a lot better than it was before PT.    Currently in Pain? No/denies                         [facility] Adult PT Treatment/Exercise - 08/17/15 0001    Lumbar Exercises: Aerobic   Stationary Bike NuStep LEs only level 6 for 7 min   Lumbar Exercises: Standing   Other Standing Lumbar Exercises 2 way hip on foam abd  and ext x 10   Lumbar Exercises: Seated   Other Seated Lumbar Exercises on sit-ft disc- alternating hip flexion, alternating LAQ, seated dead bug, pelvic rocking.    Lumbar Exercises: Supine   Bridge 10 reps   Bridge Limitations added clam with green band x 10   Knee/Hip Exercises: Stretches   Other Knee/Hip Stretches attempted lumbar    Cryotherapy   Cryotherapy Location Lumbar Spine   Type of Cryotherapy Ice pack                     PT Long Term Goals - 08/12/15 1444    PT LONG TERM GOAL #1   Title Pt will be I with advanced HEP for lumbar /core   Status On-going   PT LONG TERM GOAL #2   Title Pt will be able to stand as long as needed for housework (including dishes) without limitation of pain   Baseline improved but still bothers her if over 15 min    Status Partially Met   PT LONG TERM GOAL #3   Title Pt will report taking more frequent standing/walking breaks at work to ease stiffness and compression in spine.  Baseline lunch and breaks    Status Partially Met   PT LONG TERM GOAL #4   Title Patient will be able to lift, transfer, carry light to mod objects with good body mechanics to preserve spine.    Status Achieved   PT LONG TERM GOAL #5   Title Pt will score <40% on FOTO for improved function overall   Status Unable to assess               Plan - 08/17/15 1408    Clinical Impression Statement Pt with difficulty due to muscle spasms in various places with therex today. She reports overall LBP >/= 50% decreased.    PT Next Visit Plan review HEP, continue sit-fit and standing exercises as tolerated. DC next 2 vistis likely    PT Home Exercise Plan Post tilt, Hamstring, ant hip stretch, single knee to chest, wall slides added patellar mobs        Problem List Patient Active Problem List   Diagnosis Date Noted  . Primary osteoarthritis of left knee 11/25/2014  . Diabetes mellitus type 2 in obese ([place]) 11/25/2014  . Severe obesity (BMI >= 40)  ([place]) 11/25/2014  . Hypertension 11/25/2014  . S/P total knee replacement using cement 11/25/2014  . RUQ PAIN 03/14/2007    Dorene Ar, PTA 08/17/2015, 2:19 PM  [facility] [address] [place], [place], [address] Phone: [phone]   Fax:  [phone]  Name: ISSABELLA RIX MRN: [id number] Date of Birth: 09/05/1961

## 2015-08-19 ENCOUNTER — Ambulatory Visit: Payer: 59 | Admitting: Physical Therapy

## 2015-08-19 DIAGNOSIS — Z7409 Other reduced mobility: Secondary | ICD-10-CM | POA: Diagnosis not present

## 2015-08-19 DIAGNOSIS — M25562 Pain in left knee: Secondary | ICD-10-CM

## 2015-08-19 DIAGNOSIS — R269 Unspecified abnormalities of gait and mobility: Secondary | ICD-10-CM

## 2015-08-19 DIAGNOSIS — M5442 Lumbago with sciatica, left side: Secondary | ICD-10-CM

## 2015-08-19 DIAGNOSIS — M5386 Other specified dorsopathies, lumbar region: Secondary | ICD-10-CM

## 2015-08-19 DIAGNOSIS — M25662 Stiffness of left knee, not elsewhere classified: Secondary | ICD-10-CM

## 2015-08-19 NOTE — Therapy (Signed)
[place], [place], [address] Phone: [phone]   Fax:  [phone]  Physical Therapy Treatment  Patient Details  Name: Marie [name] MRN: [id number] Date of Birth: [birth year] No Data Recorded  Encounter Date: 08/19/2015      PT End of Session - 08/19/15 1536    Visit Number 7   Number of Visits 8   Date for PT Re-Evaluation 08/31/15   PT Start Time 0331   PT Stop Time 0412   PT Time Calculation (min) 41 min      Past Medical History  Diagnosis Date  . Diabetes mellitus without complication     borderline, states she is using Victoza for weight loss, Rx by PCP  . Fibromyalgia     followed by Dr. Estanislado Pandy  . Arthritis     knees, knees, neck, shoulders, hands   . Hypertension     Past Surgical History  Procedure Laterality Date  . Myomectomy      several surgeries for this problem  . Abdominal hysterectomy  2000  . Tonsillectomy    . Total knee arthroplasty Left 11/25/2014    dr whitfield  . Total knee arthroplasty Left 11/25/2014    Procedure: TOTAL KNEE ARTHROPLASTY;  Surgeon: Garald Balding, MD;  Location: [facility];  Service: Orthopedics;  Laterality: Left;    There were no vitals filed for this visit.  Visit Diagnosis:  Impaired functional mobility and endurance  Midline low back pain with left-sided sciatica  Decreased range of motion of lumbar spine  Knee stiffness, left  Stiffness of joint, lower leg, left  Pain in knee joint, left  Abnormality of gait      Subjective Assessment - 08/19/15 1550    Subjective No back pain now. My left knee is just tight. I try putting ice on it but I am not sure if it is swollen.   Currently in Pain? No/denies            [facility] PT Assessment - 08/19/15 1541    ROM / Strength   AROM / PROM / Strength AROM   AROM   AROM Assessment Site Knee   Right/Left Knee Right;Left   Right Knee Extension 0   Right Knee Flexion 120   Left Knee Extension -5   Left Knee Flexion 112   Strength   Right/Left Knee Right;Left                     [facility] Adult PT Treatment/Exercise - 08/19/15 0001    Lumbar Exercises: Stretches   Active Hamstring Stretch 3 reps;30 seconds   Active Hamstring Stretch Limitations strap   Quad Stretch 3 reps;30 seconds   Quad Stretch Limitations bilateral   Lumbar Exercises: Aerobic   Stationary Bike NuStep LEs only level 6 for 5 min   Lumbar Exercises: Supine   Bent Knee Raise 20 reps   Bent Knee Raise Limitations with ab set   Straight Leg Raise 15 reps   Straight Leg Raises Limitations with ab set   Knee/Hip Exercises: Stretches   Active Hamstring Stretch 3 reps;30 seconds   Knee/Hip Exercises: Machines for Strengthening   Cybex Knee Flexion 3 plates bilateral x 20 2 plates x 5 single- difficult                     PT Long Term Goals - 08/19/15 1553    PT LONG TERM GOAL #1  Title Pt will be I with advanced HEP for lumbar /core   Time 5   Period Weeks   Status On-going   PT LONG TERM GOAL #2   Title Pt will be able to stand as long as needed for housework (including dishes) without limitation of pain   Baseline can do dishes. Standing still limited to 20-25 minutes   Time 5   Period Weeks   Status Partially Met   PT LONG TERM GOAL #3   Title Pt will report taking more frequent standing/walking breaks at work to ease stiffness and compression in spine.    Time 5   Period Weeks   Status Achieved   PT LONG TERM GOAL #4   Title Patient will be able to lift, transfer, carry light to mod objects with good body mechanics to preserve spine.    Time 5   Period Weeks   Status Achieved   PT LONG TERM GOAL #5   Title Pt will score <40% on FOTO for improved function overall   Time 8   Period Weeks   Status Unable to assess               Plan - 08/19/15 1621    Clinical Impression Statement Muscle spasms in hamstrings today with attempts to stretch  opposite side. Pt reports no muscle spasms after taking magnesium after last visit. Spasms returned during todays visit. Pt is concerned about her left knee tightness and would like to address this. She demonstrates decreased AROM left vs right [name] to TKA in January. She reports only attending 2 PT visits prior to MD discontinuing PT. She still has her exercise HEP but cannot recall them in clinic.    PT Next Visit Plan Continue LE strengthening, Core strengthening in various positions, reassess knee strength and ROM, FOTO SOON        Problem List Patient Active Problem List   Diagnosis Date Noted  . Primary osteoarthritis of left knee 11/25/2014  . Diabetes mellitus type 2 in obese ([place]) 11/25/2014  . Severe obesity (BMI >= 40) ([place]) 11/25/2014  . Hypertension 11/25/2014  . S/P total knee replacement using cement 11/25/2014  . RUQ PAIN 03/14/2007    Dorene Ar, PTA 08/19/2015, 4:25 PM  [facility] [address] [place], [place], [address] Phone: [phone]   Fax:  [phone]  Name: Marie [name] MRN: [id number] Date of Birth: [birth year]

## 2015-08-24 ENCOUNTER — Ambulatory Visit: Payer: 59 | Admitting: Physical Therapy

## 2015-08-24 DIAGNOSIS — Z7409 Other reduced mobility: Secondary | ICD-10-CM | POA: Diagnosis not present

## 2015-08-24 DIAGNOSIS — M5442 Lumbago with sciatica, left side: Secondary | ICD-10-CM

## 2015-08-24 DIAGNOSIS — R269 Unspecified abnormalities of gait and mobility: Secondary | ICD-10-CM

## 2015-08-24 DIAGNOSIS — M25662 Stiffness of left knee, not elsewhere classified: Secondary | ICD-10-CM

## 2015-08-24 DIAGNOSIS — M5386 Other specified dorsopathies, lumbar region: Secondary | ICD-10-CM

## 2015-08-24 DIAGNOSIS — M25562 Pain in left knee: Secondary | ICD-10-CM

## 2015-08-24 NOTE — Therapy (Signed)
[place], [place], [address] Phone: [phone]   Fax:  [phone]  Physical Therapy Treatment / Re-certification  Patient Details  Name: Marie [name] MRN: [id number] Date of Birth: 25-Aug-1961 No Data Recorded  Encounter Date: 08/24/2015      PT End of Session - 08/24/15 1058    Visit Number 8   Number of Visits 12   Date for PT Re-Evaluation 09/21/15   PT Start Time 2081   PT Stop Time 1100   PT Time Calculation (min) 45 min   Activity Tolerance Patient tolerated treatment well   Behavior During Therapy [facility] for tasks assessed/performed      Past Medical History  Diagnosis Date  . Diabetes mellitus without complication     borderline, states she is using Victoza for weight loss, Rx by PCP  . Fibromyalgia     followed by Dr. Estanislado Pandy  . Arthritis     knees, knees, neck, shoulders, hands   . Hypertension     Past Surgical History  Procedure Laterality Date  . Myomectomy      several surgeries for this problem  . Abdominal hysterectomy  2000  . Tonsillectomy    . Total knee arthroplasty Left 11/25/2014    dr whitfield  . Total knee arthroplasty Left 11/25/2014    Procedure: TOTAL KNEE ARTHROPLASTY;  Surgeon: Garald Balding, MD;  Location: [place];  Service: Orthopedics;  Laterality: Left;    There were no vitals filed for this visit.  Visit Diagnosis:  Impaired functional mobility and endurance - Plan: PT plan of care cert/re-cert  Midline low back pain with left-sided sciatica - Plan: PT plan of care cert/re-cert  Decreased range of motion of lumbar spine - Plan: PT plan of care cert/re-cert  Knee stiffness, left - Plan: PT plan of care cert/re-cert  Stiffness of joint, lower leg, left - Plan: PT plan of care cert/re-cert  Pain in knee joint, left - Plan: PT plan of care cert/re-cert  Abnormality of gait - Plan: PT plan of care cert/re-cert      Subjective Assessment -  08/24/15 1013    Subjective "I have been doing well with no pain just tightness in the knee"    Currently in Pain? No/denies   Pain Location Back   Pain Orientation Lower   Pain Onset More than a month ago   Pain Frequency Rarely   Aggravating Factors  prolong standing   Pain Relieving Factors sitting, walking            [facility] PT Assessment - 08/24/15 0001    Assessment   Medical Diagnosis back pain , L knee stiffness   Observation/Other Assessments   Focus on Therapeutic Outcomes (FOTO)  34% limited   AROM   Left Knee Extension -5  -3 following mobilizations   Left Knee Flexion 115  117 following mobilizations   Lumbar Extension 50%   Lumbar - Right Side Bend 50%   Lumbar - Left Side Bend 50%   Lumbar - Right Rotation 50%   Lumbar - Left Rotation 50%   Strength   Right Hip Flexion 4+/5   Right Hip ABduction 4+/5   Left Hip Flexion 4+/5   Left Hip ABduction 4+/5   Right/Left Knee Left;Right   Right Knee Flexion 4+/5   Right Knee Extension 4+/5   Left Knee Flexion 4+/5   Left Knee Extension 4+/5  [facility] Adult PT Treatment/Exercise - 08/24/15 1020    Self-Care   Other Self-Care Comments  proper gait pattern to facilitate energy conservation   Lumbar Exercises: Stretches   Active Hamstring Stretch 4 reps;30 seconds  PNF contract relax with 10 sec hold   Hip Flexor Stretch 30 seconds;4 reps  PNF contract relax with 10 sec hold   Lumbar Exercises: Aerobic   Stationary Bike NuStep LEs only level 6 for 5 min   Lumbar Exercises: Supine   Bent Knee Raise 20 reps   Bent Knee Raise Limitations with ab set   Bridge 15 reps;1 second   Bridge Limitations with clam shell   Manual Therapy   Manual Therapy Joint mobilization                PT Education - 08/24/15 1058    Education provided Yes   Education Details discussed POC and plan to focus on knee mobility for the next visit   Person(s) Educated Patient   Methods Explanation    Comprehension Verbalized understanding          PT Short Term Goals - 08/24/15 1102    PT SHORT TERM GOAL #1   Title Pt will be I with HEP and use if RICE for pain, edema   Baseline Educated on elevating the knee higher to get above the level of the heart   Time 2   Period Weeks   Status Partially Met   PT SHORT TERM GOAL #2   Title Pt will be able to flex knee to 115 deg with AAROM for improved transfers   Time 4   Period Weeks   Status Achieved   PT SHORT TERM GOAL #3   Title Pt will be able to resume all household tasks unlimited by knee pain    Time 4   Period Weeks   Status On-going           PT Long Term Goals - 08/24/15 1103    PT LONG TERM GOAL #1   Title Pt will be I with advanced HEP for lumbar /core   Time 5   Period Weeks   Status On-going   PT LONG TERM GOAL #2   Title Pt will be able to stand as long as needed for housework (including dishes) without limitation of pain   Baseline can do dishes. Standing still limited to 20-25 minutes   Time 5   Period Weeks   Status Partially Met   PT LONG TERM GOAL #3   Title Pt will report taking more frequent standing/walking breaks at work to ease stiffness and compression in spine.    Baseline lunch and breaks    Time 5   Period Weeks   Status Achieved   PT LONG TERM GOAL #4   Title Patient will be able to lift, transfer, carry light to mod objects with good body mechanics to preserve spine.    Time 5   Period Weeks   Status Achieved   PT LONG TERM GOAL #5   Title Pt will score <40% on FOTO for improved function overall   Time 8   Period Weeks   Status On-going               Plan - 08/24/15 1059    Clinical Impression Statement Marie [name] continues to make progress with no back pain and improved trunk mobility. Her biggest concern is stiffness of the knee which she demonstates decreased AROM compared bil. Following  mobilizations she was able to increase her AROM to  -3 of extension, and 117 of  flexion. Discussed efficient gait and to practice with focus on heel strike and follow throughout avoiding walking stiff legged.  She met all STG goals Except for partially meeting STG #1. Plan to continue with POC to progress toward goals.    Pt will benefit from skilled therapeutic intervention in order to improve on the following deficits Abnormal gait;Decreased strength;Improper body mechanics;Difficulty walking;Decreased activity tolerance;Decreased mobility;Impaired flexibility;Obesity;Decreased range of motion;Decreased endurance;Increased fascial restricitons   PT Frequency 1x / week   PT Duration 4 weeks   PT Treatment/Interventions ADLs/Self Care Home Management;Functional mobility training;Manual techniques;Therapeutic activities;Electrical Stimulation;Cryotherapy;Therapeutic exercise;Neuromuscular re-education;Ultrasound;Patient/family education   PT Next Visit Plan Continue LE strengthening, Core strengthening in various positions, gait training, mobs for knee mobility.    PT Home Exercise Plan HEP review   Consulted and Agree with Plan of Care Patient        Problem List Patient Active Problem List   Diagnosis Date Noted  . Primary osteoarthritis of left knee 11/25/2014  . Diabetes mellitus type 2 in obese ([place]) 11/25/2014  . Severe obesity (BMI >= 40) ([place]) 11/25/2014  . Hypertension 11/25/2014  . S/P total knee replacement using cement 11/25/2014  . RUQ PAIN 03/14/2007   Starr Lake PT, DPT, LAT, ATC  08/24/2015  11:10 AM    [facility] [address] [place], [place], [address] Phone: [phone]   Fax:  [phone]  Name: Marie [name] MRN: [id number] Date of Birth: June 10, 1961

## 2015-08-26 ENCOUNTER — Encounter: Payer: 59 | Admitting: Physical Therapy

## 2015-08-31 ENCOUNTER — Encounter: Payer: 59 | Admitting: Physical Therapy

## 2015-09-02 ENCOUNTER — Ambulatory Visit: Payer: 59 | Attending: Rheumatology | Admitting: Physical Therapy

## 2015-09-02 DIAGNOSIS — M25662 Stiffness of left knee, not elsewhere classified: Secondary | ICD-10-CM | POA: Diagnosis present

## 2015-09-02 DIAGNOSIS — M2560 Stiffness of unspecified joint, not elsewhere classified: Secondary | ICD-10-CM | POA: Diagnosis present

## 2015-09-02 DIAGNOSIS — M5442 Lumbago with sciatica, left side: Secondary | ICD-10-CM | POA: Diagnosis present

## 2015-09-02 DIAGNOSIS — R269 Unspecified abnormalities of gait and mobility: Secondary | ICD-10-CM | POA: Diagnosis present

## 2015-09-02 DIAGNOSIS — Z7409 Other reduced mobility: Secondary | ICD-10-CM | POA: Diagnosis not present

## 2015-09-02 DIAGNOSIS — M25562 Pain in left knee: Secondary | ICD-10-CM | POA: Diagnosis present

## 2015-09-02 DIAGNOSIS — M5386 Other specified dorsopathies, lumbar region: Secondary | ICD-10-CM

## 2015-09-02 NOTE — Therapy (Signed)
[place], [place], [address] Phone: [phone]   Fax:  [phone]  Physical Therapy Treatment  Patient Details  Name: Marie [name] MRN: [id number] Date of Birth: 15-May-1961 No Data Recorded  Encounter Date: 09/02/2015      PT End of Session - 09/02/15 1524    Visit Number 9   Number of Visits 12   Date for PT Re-Evaluation 09/21/15   PT Start Time 0323   PT Stop Time 0407   PT Time Calculation (min) 44 min      Past Medical History  Diagnosis Date  . Diabetes mellitus without complication     borderline, states she is using Victoza for weight loss, Rx by PCP  . Fibromyalgia     followed by Dr. Estanislado Pandy  . Arthritis     knees, knees, neck, shoulders, hands   . Hypertension     Past Surgical History  Procedure Laterality Date  . Myomectomy      several surgeries for this problem  . Abdominal hysterectomy  2000  . Tonsillectomy    . Total knee arthroplasty Left 11/25/2014    dr whitfield  . Total knee arthroplasty Left 11/25/2014    Procedure: TOTAL KNEE ARTHROPLASTY;  Surgeon: Garald Balding, MD;  Location: [place];  Service: Orthopedics;  Laterality: Left;    There were no vitals filed for this visit.  Visit Diagnosis:  Impaired functional mobility and endurance  Midline low back pain with left-sided sciatica  Decreased range of motion of lumbar spine  Knee stiffness, left  Stiffness of joint, lower leg, left  Pain in knee joint, left  Abnormality of gait      Subjective Assessment - 09/02/15 1611    Currently in Pain? No/denies            [facility] PT Assessment - 09/02/15 1551    AROM   Left Knee Flexion 110  120 AAROM                     [facility] Adult PT Treatment/Exercise - 09/02/15 1530    Lumbar Exercises: Aerobic   Stationary Bike NuStep LEs only level 6 for 7 min   Lumbar Exercises: Prone   Single Arm Raise 5 reps   Single Arm Raise  Weights (lbs) unable to left left arm   Straight Leg Raise 20 reps   Straight Leg Raises Limitations alternating with ab brace   Opposite Arm/Leg Raise 5 reps   Opposite Arm/Leg Raise Limitations without using LUE   Lumbar Exercises: Quadruped   Single Arm Raise 10 reps   Single Arm Raise Weights (lbs) with ab brace   Straight Leg Raise 5 reps   Straight Leg Raises Limitations unable to place full weight on left knee   Knee/Hip Exercises: Machines for Strengthening   Cybex Knee Extension 1 plate x 28U1 bilateral   Cybex Knee Flexion 3 plates x 20, left only 2 plates x 10   Cybex Leg Press 1 plate x 10, 2 plates x 20   Knee/Hip Exercises: Supine   Heel Slides AAROM;Both;1 set;5 reps   Heel Slides Limitations with sheet   Patellar Mobs all planes                  PT Short Term Goals - 08/24/15 1102    PT SHORT TERM GOAL #1   Title Pt will be I with HEP and use if RICE for pain,  edema   Baseline Educated on elevating the knee higher to get above the level of the heart   Time 2   Period Weeks   Status Partially Met   PT SHORT TERM GOAL #2   Title Pt will be able to flex knee to 115 deg with AAROM for improved transfers   Time 4   Period Weeks   Status Achieved   PT SHORT TERM GOAL #3   Title Pt will be able to resume all household tasks unlimited by knee pain    Time 4   Period Weeks   Status On-going           PT Long Term Goals - 08/24/15 1103    PT LONG TERM GOAL #1   Title Pt will be I with advanced HEP for lumbar /core   Time 5   Period Weeks   Status On-going   PT LONG TERM GOAL #2   Title Pt will be able to stand as long as needed for housework (including dishes) without limitation of pain   Baseline can do dishes. Standing still limited to 20-25 minutes   Time 5   Period Weeks   Status Partially Met   PT LONG TERM GOAL #3   Title Pt will report taking more frequent standing/walking breaks at work to ease stiffness and compression in spine.     Baseline lunch and breaks    Time 5   Period Weeks   Status Achieved   PT LONG TERM GOAL #4   Title Patient will be able to lift, transfer, carry light to mod objects with good body mechanics to preserve spine.    Time 5   Period Weeks   Status Achieved   PT LONG TERM GOAL #5   Title Pt will score <40% on FOTO for improved function overall   Time 8   Period Weeks   Status On-going               Plan - 09/02/15 1608    Clinical Impression Statement Attempted quadruped and prone positions for strengthening with patient having difficulty bearing weight through left knee on mat table as well as limited left shoulder ROM in prone. Instructed pt in LE strengthening and left knee mobility exercises. Pt is consistent with her HEP and has no increased pain at end of treatment.    PT Next Visit Plan Continue LE strengthening, Core strengthening in various positions, gait training, mobs for knee mobility.         Problem List Patient Active Problem List   Diagnosis Date Noted  . Primary osteoarthritis of left knee 11/25/2014  . Diabetes mellitus type 2 in obese ([place]) 11/25/2014  . Severe obesity (BMI >= 40) ([place]) 11/25/2014  . Hypertension 11/25/2014  . S/P total knee replacement using cement 11/25/2014  . RUQ PAIN 03/14/2007    Dorene Ar, PTA 09/02/2015, 4:12 PM  [facility] [address] [place], [place], [address] Phone: [phone]   Fax:  [phone]  Name: Marie [name] MRN: [id number] Date of Birth: 08-05-1961

## 2015-09-09 ENCOUNTER — Ambulatory Visit: Payer: 59 | Admitting: Physical Therapy

## 2015-09-09 DIAGNOSIS — Z7409 Other reduced mobility: Secondary | ICD-10-CM

## 2015-09-09 DIAGNOSIS — M5442 Lumbago with sciatica, left side: Secondary | ICD-10-CM

## 2015-09-09 DIAGNOSIS — M25562 Pain in left knee: Secondary | ICD-10-CM

## 2015-09-09 DIAGNOSIS — R269 Unspecified abnormalities of gait and mobility: Secondary | ICD-10-CM

## 2015-09-09 DIAGNOSIS — M5386 Other specified dorsopathies, lumbar region: Secondary | ICD-10-CM

## 2015-09-09 DIAGNOSIS — M25662 Stiffness of left knee, not elsewhere classified: Secondary | ICD-10-CM

## 2015-09-09 NOTE — Patient Instructions (Addendum)
Quads / HF, Supine    Lie near edge of bed, one leg bent, foot flat on bed. Other leg hanging over edge, relaxed, thigh resting entirely on bed. Bend hanging knee backward keeping thigh in contact with bed. Hold _30__ seconds. Pull using towel, strap Repeat __3_ times per session. Do _3__ sessions per day.    http://orth.exer.us/690   CKnee Flexion: Resisted (Sitting)   Sit with band under left foot and looped around ankle of supported leg. Pull unsupported leg back. Repeat _15___ times per set. Do _2___ sets per session. Do _2___ sessions per day. PLACE KNOT IN DOOR http://orth.exer.us/695   Copyright  VHI. All rights reserved.

## 2015-09-09 NOTE — Therapy (Signed)
[place], [place], [address] Phone: [phone]   Fax:  [phone]  Physical Therapy Treatment  Patient Details  Name: Marie [name] MRN: [id number] Date of Birth: [birth year] No Data Recorded  Encounter Date: 09/09/2015      PT End of Session - 09/09/15 1059    Visit Number 10   Number of Visits 12   Date for PT Re-Evaluation 09/21/15   PT Start Time 8333   PT Stop Time 1100   PT Time Calculation (min) 46 min      Past Medical History  Diagnosis Date  . Diabetes mellitus without complication     borderline, states she is using Victoza for weight loss, Rx by PCP  . Fibromyalgia     followed by Dr. Estanislado Pandy  . Arthritis     knees, knees, neck, shoulders, hands   . Hypertension     Past Surgical History  Procedure Laterality Date  . Myomectomy      several surgeries for this problem  . Abdominal hysterectomy  2000  . Tonsillectomy    . Total knee arthroplasty Left 11/25/2014    dr whitfield  . Total knee arthroplasty Left 11/25/2014    Procedure: TOTAL KNEE ARTHROPLASTY;  Surgeon: Garald Balding, MD;  Location: [place];  Service: Orthopedics;  Laterality: Left;    There were no vitals filed for this visit.  Visit Diagnosis:  Impaired functional mobility and endurance  Midline low back pain with left-sided sciatica  Decreased range of motion of lumbar spine  Knee stiffness, left  Stiffness of joint, lower leg, left  Pain in knee joint, left  Abnormality of gait          [facility] PT Assessment - 09/09/15 1047    AROM   Left Knee Flexion 110  120 AAROM                     [facility] Adult PT Treatment/Exercise - 09/09/15 1024    Lumbar Exercises: Aerobic   Stationary Bike NuStep LEs only level 6 for 10 min   Knee/Hip Exercises: Stretches   Hip Flexor Stretch 3 reps;30 seconds   Hip Flexor Stretch Limitations with strap off edge of table, x2 passively   Other  Knee/Hip Stretches patellar mobilizations to ease knee stifffness, very hypomobile   Knee/Hip Exercises: Seated   Hamstring Curl Left;2 sets;15 reps   Hamstring Limitations green band   Knee/Hip Exercises: Prone   Hamstring Curl 2 sets;10 reps   Hamstring Curl Limitations 5#   Manual Therapy   Manual Therapy Joint mobilization;Passive ROM   Joint Mobilization Prone maitland mobs to increase knee flexion 3 bouts 30 sec each   Passive ROM contract relax to increase knee flexion                 PT Education - 09/09/15 1056    Education provided Yes   Education Details Hip flexor/quad stretch, green band hamstring curl   Person(s) Educated Patient   Methods Explanation;Handout   Comprehension Verbalized understanding          PT Short Term Goals - 09/09/15 1057    PT SHORT TERM GOAL #1   Title Pt will be I with HEP and use if RICE for pain, edema   Time 2   Period Weeks   Status Achieved   PT SHORT TERM GOAL #2   Title Pt will be able to flex knee to 115  deg with AAROM for improved transfers   Baseline 120   Time 4   Period Weeks   Status Achieved   PT SHORT TERM GOAL #3   Title Pt will be able to resume all household tasks unlimited by knee pain    Time 4   Period Weeks   Status Achieved           PT Long Term Goals - 09/09/15 1058    PT LONG TERM GOAL #1   Title Pt will be I with advanced HEP for lumbar /core   Time 5   Period Weeks   Status On-going   PT LONG TERM GOAL #2   Title Pt will be able to stand as long as needed for housework (including dishes) without limitation of pain   Baseline able to shop for 1 hour without pain   Time 5   Period Weeks   Status Achieved   PT LONG TERM GOAL #3   Title Pt will report taking more frequent standing/walking breaks at work to ease stiffness and compression in spine.    Time 5   Period Weeks   Status Achieved   PT LONG TERM GOAL #4   Title Patient will be able to lift, transfer, carry light to mod  objects with good body mechanics to preserve spine.    Time 5   Period Weeks   Status Achieved   PT LONG TERM GOAL #5   Title Pt will score <40% on FOTO for improved function overall   Time 8   Period Weeks               Plan - 09/09/15 1232    Clinical Impression Statement Treatment focues on knee flexibility and hamstring strength on left knee. She reports continued tighntess in left knee. Issued hip flxor/ quad stretch to HEP as well as seated hamstring curls with green band. Active and Passive knee flexion unchanged after manual. Pt saw Dr Durward Fortes yesterday who told her the tightness was normal following knee replacement. She continues to report decreased lumbar pain and improved endurance with standing/walking activities. LTG#2 Met.    PT Next Visit Plan Continue LE strengthening, Core strengthening in various positions, gait training, mobs for knee mobility.         Problem List Patient Active Problem List   Diagnosis Date Noted  . Primary osteoarthritis of left knee 11/25/2014  . Diabetes mellitus type 2 in obese ([place]) 11/25/2014  . Severe obesity (BMI >= 40) ([place]) 11/25/2014  . Hypertension 11/25/2014  . S/P total knee replacement using cement 11/25/2014  . RUQ PAIN 03/14/2007    Dorene Ar, PTA 09/09/2015, 12:37 PM  [facility] [address] [place], [place], [address] Phone: [phone]   Fax:  [phone]  Name: Marie [name] MRN: [id number] Date of Birth: 06-13-1961

## 2015-09-16 ENCOUNTER — Ambulatory Visit: Payer: 59 | Admitting: Physical Therapy

## 2015-09-16 DIAGNOSIS — M25662 Stiffness of left knee, not elsewhere classified: Secondary | ICD-10-CM

## 2015-09-16 DIAGNOSIS — R269 Unspecified abnormalities of gait and mobility: Secondary | ICD-10-CM

## 2015-09-16 DIAGNOSIS — M5386 Other specified dorsopathies, lumbar region: Secondary | ICD-10-CM

## 2015-09-16 DIAGNOSIS — Z7409 Other reduced mobility: Secondary | ICD-10-CM | POA: Diagnosis not present

## 2015-09-16 DIAGNOSIS — M25562 Pain in left knee: Secondary | ICD-10-CM

## 2015-09-16 DIAGNOSIS — M5442 Lumbago with sciatica, left side: Secondary | ICD-10-CM

## 2015-09-16 NOTE — Patient Instructions (Signed)
   Rox Mcgriff PT, DPT, LAT, ATC  [facility] Phone: [phone]     

## 2015-09-16 NOTE — Therapy (Signed)
Marie [name], [place], [address] Phone: [phone]   Fax:  [phone]  Physical Therapy Treatment  Patient Details  Name: Marie [name] MRN: [id number] Date of Birth: [birth year] No Data Recorded  Encounter Date: 09/16/2015      PT End of Session - 09/16/15 1553    Visit Number 11   Number of Visits 12   Date for PT Re-Evaluation 09/21/15   PT Start Time 1545   PT Stop Time 1625   PT Time Calculation (min) 40 min   Activity Tolerance Patient tolerated treatment well   Behavior During Therapy [facility] for tasks assessed/performed      Past Medical History  Diagnosis Date  . Diabetes mellitus without complication     borderline, states she is using Victoza for weight loss, Rx by PCP  . Fibromyalgia     followed by Dr. Estanislado Pandy  . Arthritis     knees, knees, neck, shoulders, hands   . Hypertension     Past Surgical History  Procedure Laterality Date  . Myomectomy      several surgeries for this problem  . Abdominal hysterectomy  2000  . Tonsillectomy    . Total knee arthroplasty Left 11/25/2014    dr whitfield  . Total knee arthroplasty Left 11/25/2014    Procedure: TOTAL KNEE ARTHROPLASTY;  Surgeon: Garald Balding, MD;  Location: [place];  Service: Orthopedics;  Laterality: Left;    There were no vitals filed for this visit.  Visit Diagnosis:  Impaired functional mobility and endurance  Midline low back pain with left-sided sciatica  Decreased range of motion of lumbar spine  Knee stiffness, left  Stiffness of joint, lower leg, left  Pain in knee joint, left  Abnormality of gait      Subjective Assessment - 09/16/15 1552    Subjective " I am doing much better, I have only some tightness in the legs thats about it"    Currently in Pain? No/denies   Pain Score 0-No pain   Pain Location Back   Pain Orientation Lower            [facility] PT Assessment - 09/16/15 0001     Observation/Other Assessments   Focus on Therapeutic Outcomes (FOTO)  39% limited   AROM   Left Knee Extension 0   Left Knee Flexion 118   Lumbar Extension 70   Lumbar - Right Side Bend 28   Lumbar - Left Side Bend 52   Lumbar - Right Rotation 54                     [facility] Adult PT Treatment/Exercise - 09/16/15 1555    Straight leg raises 2 x 10 with 5# weight,  Bent knee raises 2 x 15 : VC for abdominal draw in [employer] stretch 2 x 30 sec hold Hip flexor stretch 2 x 30 sec hold   Lumbar Exercises: Aerobic   Stationary Bike  NuStep LEs L7 x 10 min                   PT Education - 09/16/15 1624    Education provided Yes   Education Details reviewed and updated HEP   Person(s) Educated Patient   Methods Explanation   Comprehension Verbalized understanding          PT Short Term Goals - 09/09/15 1057    PT SHORT TERM GOAL #1   Title Pt  will be I with HEP and use if RICE for pain, edema   Time 2   Period Weeks   Status Achieved   PT SHORT TERM GOAL #2   Title Pt will be able to flex knee to 115 deg with AAROM for improved transfers   Baseline 120   Time 4   Period Weeks   Status Achieved   PT SHORT TERM GOAL #3   Title Pt will be able to resume all household tasks unlimited by knee pain    Time 4   Period Weeks   Status Achieved           PT Long Term Goals - 09/16/15 1611    PT LONG TERM GOAL #2   Title Pt will be able to stand as long as needed for housework (including dishes) without limitation of pain   Baseline able to shop for 1 hour without pain   Time 5   Period Weeks   Status Achieved   PT LONG TERM GOAL #3   Title Pt will report taking more frequent standing/walking breaks at work to ease stiffness and compression in spine.    Baseline lunch and breaks    Time 5   Period Weeks   Status Achieved   PT LONG TERM GOAL #4   Title Patient will be able to lift, transfer, carry light to mod objects with good body mechanics  to preserve spine.    Time 5   Period Weeks   Status Achieved   PT LONG TERM GOAL #5   Title Pt will score <40% on FOTO for improved function overall   Time 8   Period Weeks   Status On-going               Plan - 09/16/15 1624    Clinical Impression Statement Naima reports that she has been doing much better. she has been complete all exercises without mild complaint of cramping that ceased after activity stopped. She has improved her strength, ROM and trunk mobility. She has met all goals except for LTG #5. she reports that she is able to maintain and progress her current level of function independently and will be D/C today.    PT Next Visit Plan DC   PT Home Exercise Plan bridge with clamshell and supine marching   Consulted and Agree with Plan of Care Patient        Problem List Patient Active Problem List   Diagnosis Date Noted  . Primary osteoarthritis of left knee 11/25/2014  . Diabetes mellitus type 2 in obese ([place]) 11/25/2014  . Severe obesity (BMI >= 40) ([place]) 11/25/2014  . Hypertension 11/25/2014  . S/P total knee replacement using cement 11/25/2014  . RUQ PAIN 03/14/2007   Starr Lake PT, DPT, LAT, ATC  09/16/2015  4:28 PM    [facility] [address] [place], [place], [address] Phone: [phone]   Fax:  [phone]  Name: Marie [name] MRN: [id number] Date of Birth: 02/05/1961  PHYSICAL THERAPY DISCHARGE SUMMARY  Visits from Start of Care: 11  Current functional level related to goals / functional outcomes: FOTO 39% limited   Remaining deficits: Intermittent spasm of the muscles in the leg.    Education / Equipment: HEP, theraband for strengthening.   Plan: Patient agrees to discharge.  Patient goals were partially met. Patient is being discharged due to being pleased with the current functional level.  ?????  Tavone Caesar PT, DPT, LAT, ATC  09/16/2015  4:30  PM

## 2015-09-23 ENCOUNTER — Encounter: Payer: 59 | Admitting: Physical Therapy

## 2015-09-30 ENCOUNTER — Encounter: Payer: 59 | Admitting: Physical Therapy

## 2016-07-14 ENCOUNTER — Encounter (INDEPENDENT_AMBULATORY_CARE_PROVIDER_SITE_OTHER): Payer: Self-pay

## 2016-09-12 ENCOUNTER — Ambulatory Visit (INDEPENDENT_AMBULATORY_CARE_PROVIDER_SITE_OTHER): Payer: 59 | Admitting: Orthopaedic Surgery

## 2016-09-12 ENCOUNTER — Encounter (INDEPENDENT_AMBULATORY_CARE_PROVIDER_SITE_OTHER): Payer: Self-pay | Admitting: Orthopaedic Surgery

## 2016-09-12 VITALS — BP 126/71 | HR 81 | Resp 14 | Ht 66.0 in | Wt 310.0 lb

## 2016-09-12 DIAGNOSIS — M25562 Pain in left knee: Secondary | ICD-10-CM | POA: Diagnosis not present

## 2016-09-12 DIAGNOSIS — G8929 Other chronic pain: Secondary | ICD-10-CM

## 2016-09-12 IMAGING — CR DG CHEST 2V
2 series · 2 of 2 positions shown · non-contrast
Comparison: None

CLINICAL DATA: Preoperative evaluation LEFT knee replacement,
history hypertension, diabetes, fibromyalgia

EXAM:
CHEST  2 VIEW

[w chest pa]
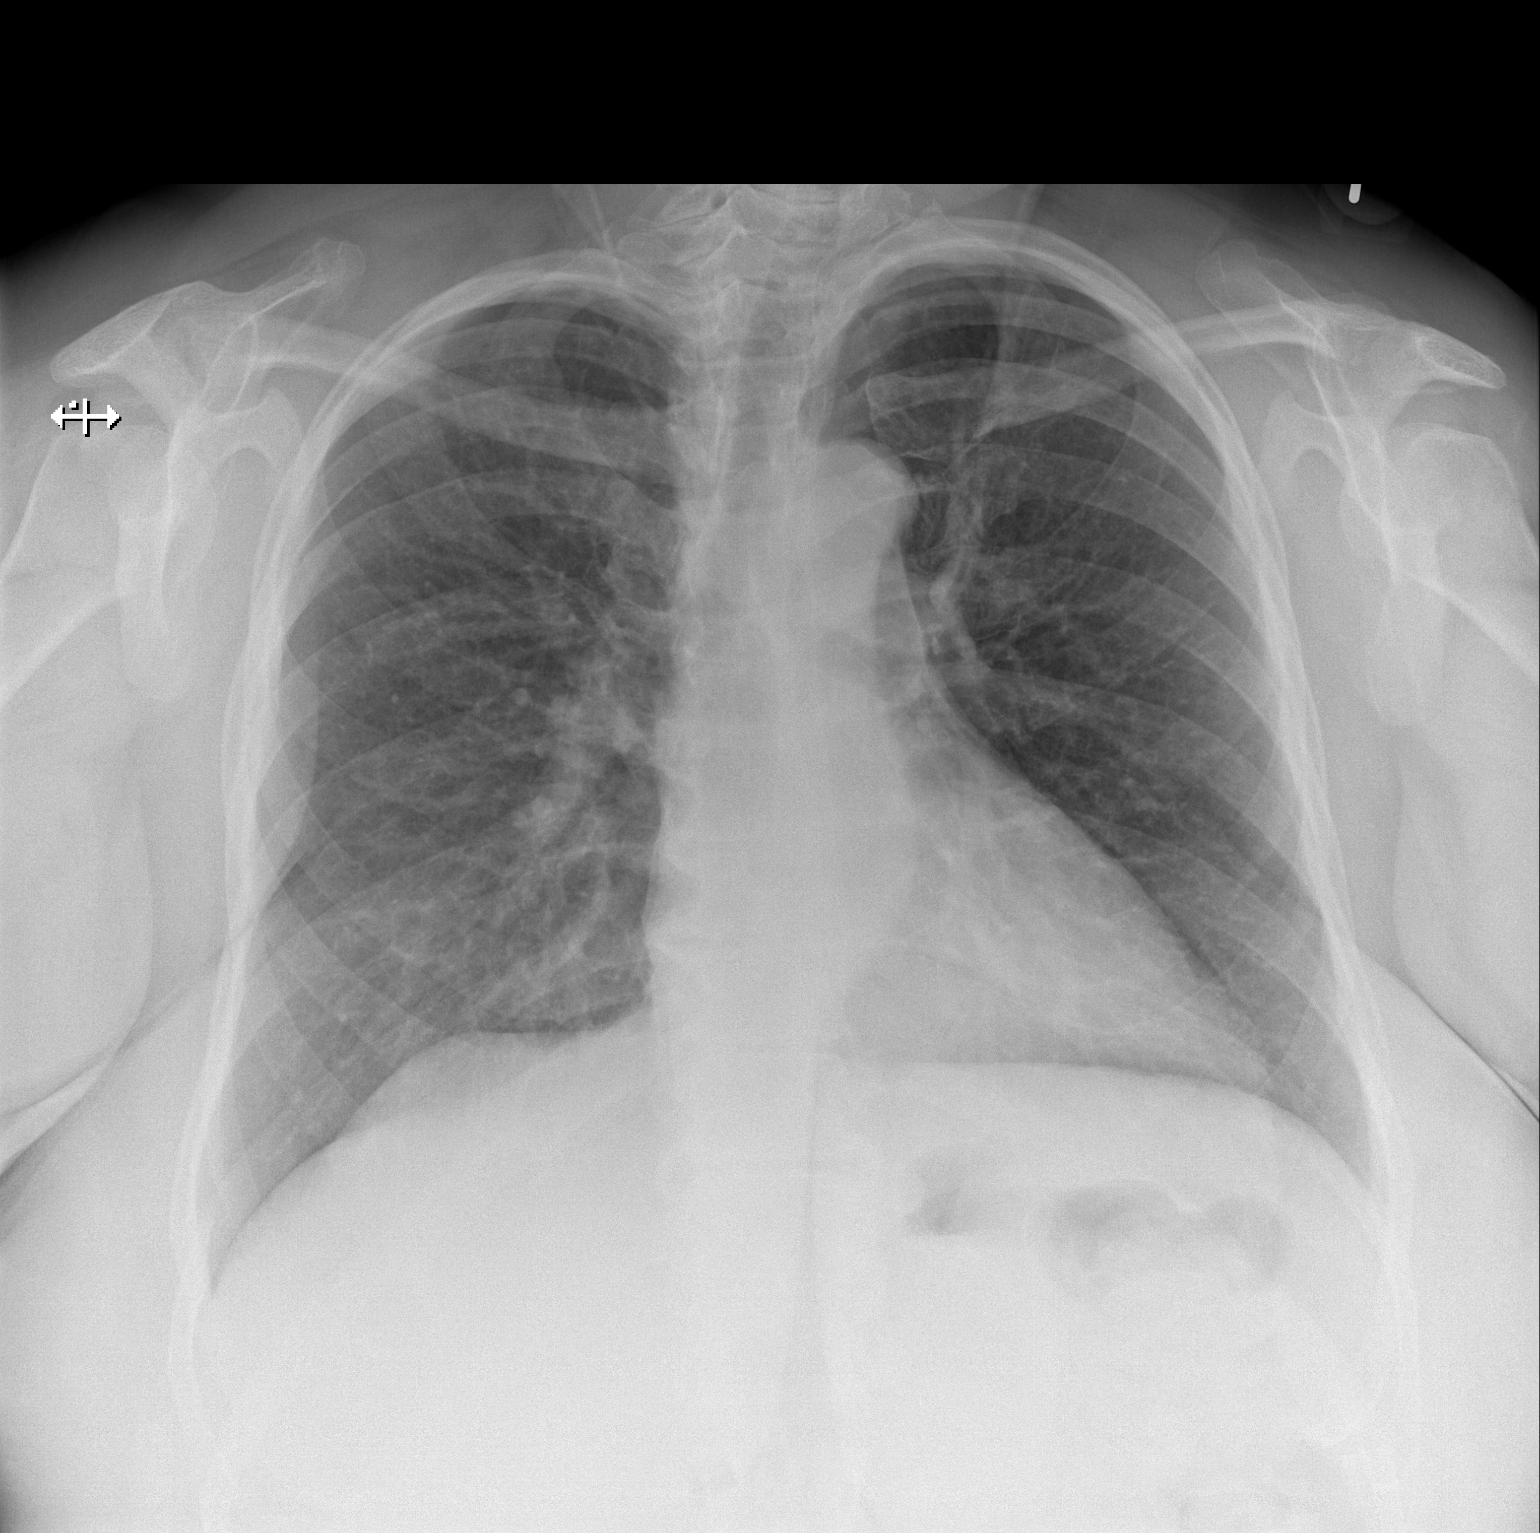

[w chest lat]
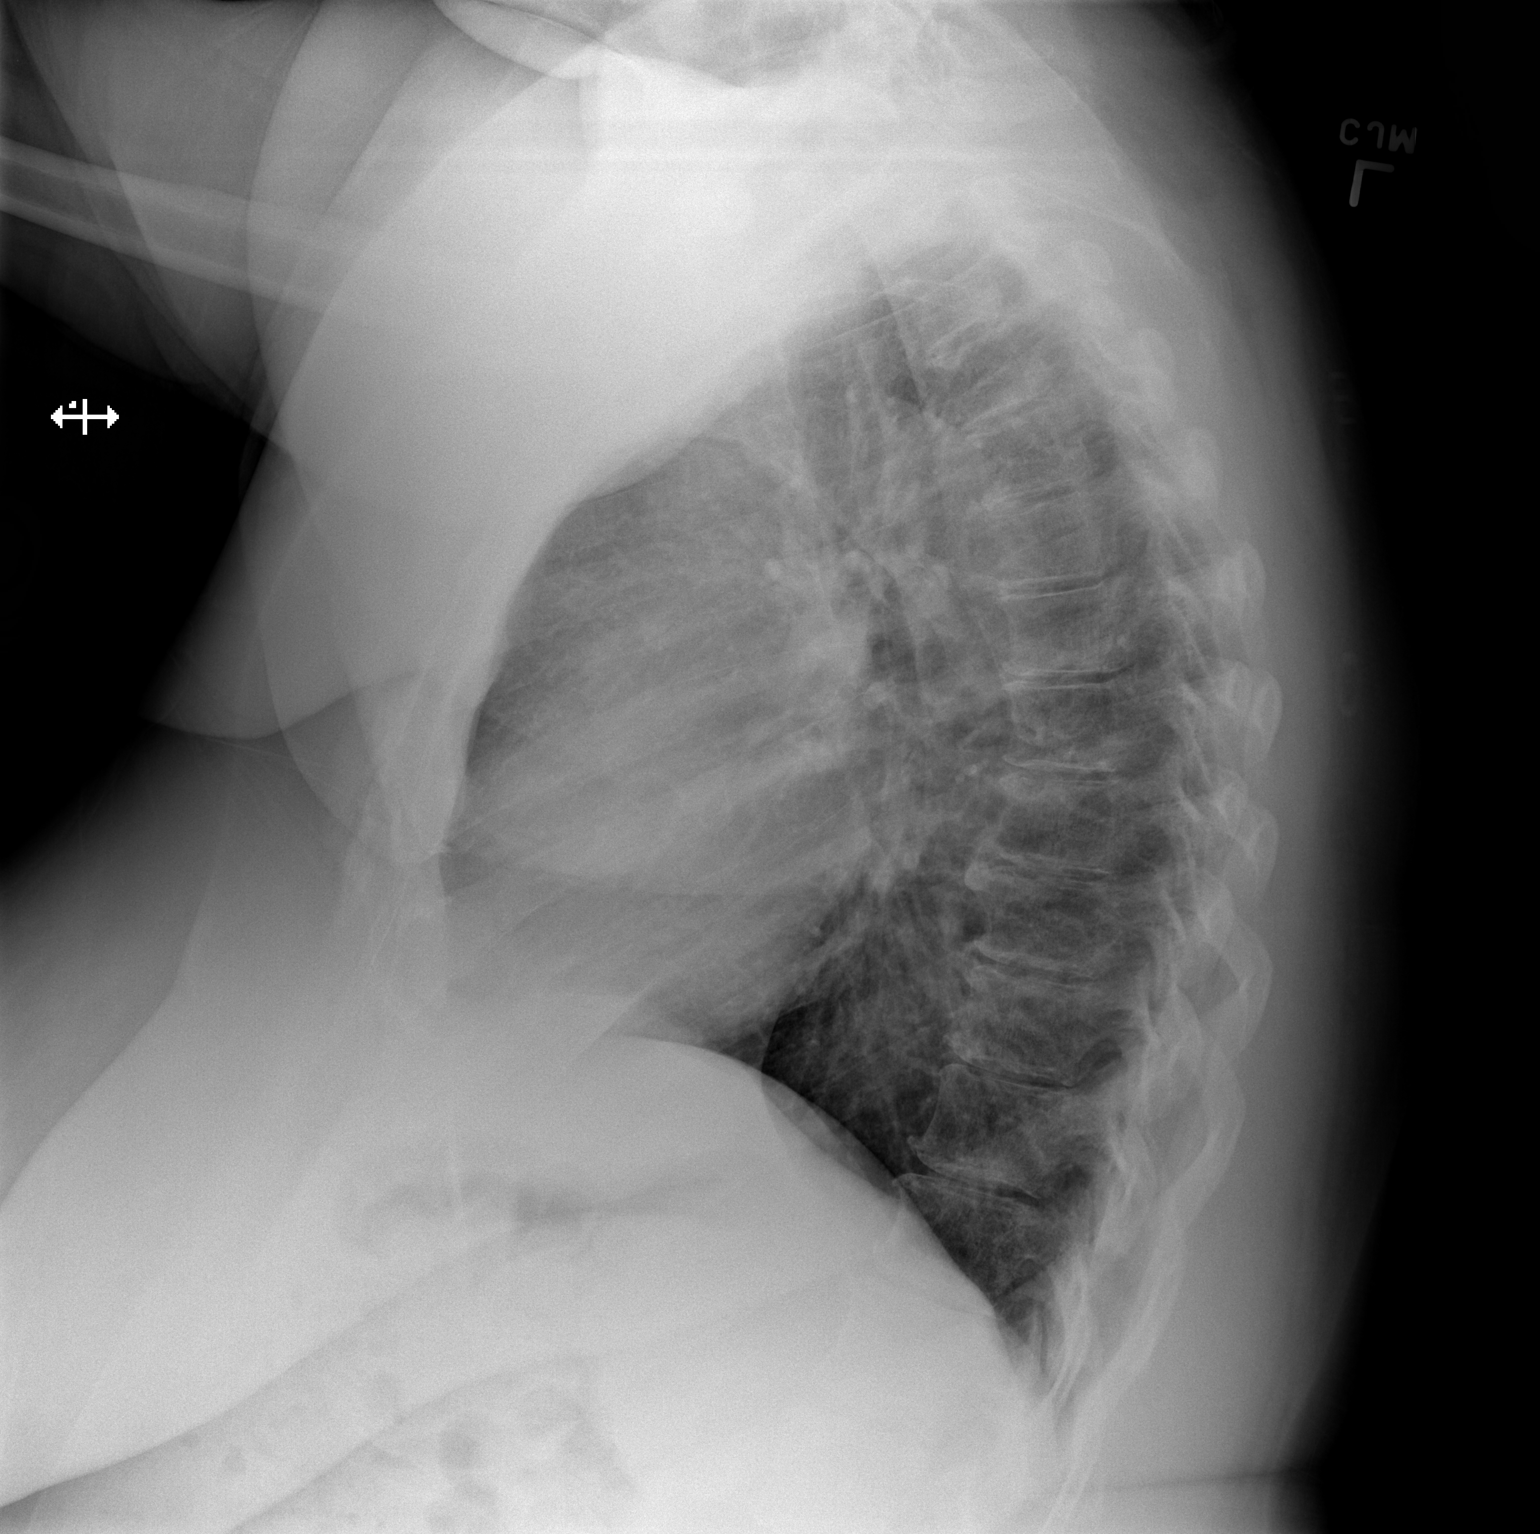

[2 of 2 positions shown; findings below may reference images not displayed]

FINDINGS: Upper normal heart size.

Tortuous aorta.

Pulmonary vascularity normal.

Lungs clear.

No pleural effusion or pneumothorax.

Scattered endplate spur formation thoracic spine.
IMPRESSION: No acute abnormalities.

## 2016-09-12 NOTE — Progress Notes (Signed)
   Office Visit Note   Patient: Marie [name]           Date of Birth: September 30, 1961           MRN: [id number] Visit Date: 09/12/2016              Requested by: Lupita RaiderKimberlee Shaw, MD [address] [place], [place] [address] PCP: Lupita RaiderSHAW,KIMBERLEE, MD   Assessment & Plan: Visit Diagnoses: No diagnosis found.  Plan: f/u prn. Mrs Marie BongoWhitakerhas done exceptionally well over the past 2 years would encourage her to work with her exercises and plan to see her back in a when necessary basis.  Follow-Up Instructions: No Follow-up on file.   Orders:  No orders of the defined types were placed in this encounter.  No orders of the defined types were placed in this encounter.     Procedures: No procedures performed   Clinical Data: No additional findings.   Subjective: No chief complaint on file.   Follow up appt for left total knee replacement 10/2014, some stiffness, tightness, feels like some swelling, feels like "squeezing".    Review of Systems  Constitutional: Negative.   HENT: Negative.   Eyes: Negative.   Respiratory: Negative.   Cardiovascular: Negative.   Gastrointestinal: Negative.   Endocrine: Negative.   Genitourinary: Negative.   Musculoskeletal: Negative.   Skin: Negative.   Allergic/Immunologic: Negative.   Neurological: Negative.   Hematological: Negative.   Psychiatric/Behavioral: Negative.   All other systems reviewed and are negative.    Objective: Vital Signs: There were no vitals taken for this visit.  Physical Exam  Ortho Exam left knee exam reveals no evidence of effusion. She had full extension and flexion to 105 110. There is no evidence of instability. Her knee was not hot red or swollen. There was noswelling distally; neurovascular exam was intact.  Specialty Comments:  No specialty comments available.  Imaging: No results found.   PMFS History: Patient Active Problem List   Diagnosis Date Noted  . Primary osteoarthritis  of left knee 11/25/2014  . Diabetes mellitus type 2 in obese ([facility]) 11/25/2014  . Severe obesity (BMI >= 40) ([facility]) 11/25/2014  . Hypertension 11/25/2014  . S/P total knee replacement using cement 11/25/2014  . RUQ PAIN 03/14/2007   Past Medical History:  Diagnosis Date  . Arthritis    knees, knees, neck, shoulders, hands   . Diabetes mellitus without complication    borderline, states she is using Victoza for weight loss, Rx by PCP  . Fibromyalgia    followed by Dr. Corliss Skainseveshwar  . Hypertension     No family history on file.  Past Surgical History:  Procedure Laterality Date  . ABDOMINAL HYSTERECTOMY  2000  . MYOMECTOMY     several surgeries for this problem  . TONSILLECTOMY    . TOTAL KNEE ARTHROPLASTY Left 11/25/2014   dr Vernadine Coombs  . TOTAL KNEE ARTHROPLASTY Left 11/25/2014   Procedure: TOTAL KNEE ARTHROPLASTY;  Surgeon: Valeria BatmanPeter W Kimberley Dastrup, MD;  [facility];  Service: Orthopedics;  Laterality: Left;   Social History   Occupational History  . Not on file.   Social History Main Topics  . Smoking status: Never Smoker  . Smokeless tobacco: Never Used  . Alcohol use No  . Drug use: No  . Sexual activity: Not on file

## 2016-11-14 DIAGNOSIS — E782 Mixed hyperlipidemia: Secondary | ICD-10-CM | POA: Diagnosis not present

## 2016-11-14 DIAGNOSIS — E1165 Type 2 diabetes mellitus with hyperglycemia: Secondary | ICD-10-CM | POA: Diagnosis not present

## 2016-12-09 ENCOUNTER — Other Ambulatory Visit: Payer: Self-pay | Admitting: *Deleted

## 2016-12-09 ENCOUNTER — Other Ambulatory Visit: Payer: Self-pay | Admitting: Radiology

## 2016-12-09 MED ORDER — TRAMADOL HCL 50 MG PO TABS: ORAL_TABLET | ORAL | 1 refills | Status: DC

## 2016-12-09 NOTE — Telephone Encounter (Signed)
Prescription refill received via fax for Tramadol  Last Visit: 07/12/16 Next Visit: 01/10/17 UDS: 12/24/15 Narc Agreement: 07/2016  Okay to refill Tramadol?

## 2017-01-02 DIAGNOSIS — R5383 Other fatigue: Secondary | ICD-10-CM | POA: Insufficient documentation

## 2017-01-02 DIAGNOSIS — M797 Fibromyalgia: Secondary | ICD-10-CM | POA: Insufficient documentation

## 2017-01-02 DIAGNOSIS — M170 Bilateral primary osteoarthritis of knee: Secondary | ICD-10-CM | POA: Insufficient documentation

## 2017-01-02 DIAGNOSIS — F5101 Primary insomnia: Secondary | ICD-10-CM | POA: Insufficient documentation

## 2017-01-02 DIAGNOSIS — Z8639 Personal history of other endocrine, nutritional and metabolic disease: Secondary | ICD-10-CM | POA: Insufficient documentation

## 2017-01-02 NOTE — Progress Notes (Signed)
Office Visit Note  Patient: Marie GrossKathy O Reichl             Date of Birth: 26-Nov-1960           MRN: [id number]             PCP: Lupita RaiderSHAW,KIMBERLEE, MD Referring: Lupita RaiderShaw, Kimberlee, MD Visit Date: 01/10/2017 Occupation: @GUAROCC @    Subjective:  Pain around shoulders   History of Present Illness: Marie [name] is a 56 y.o. female with history of fibromyalgia and osteoarthritis. She states she continues to have pain off and on. The pain is mostly around neck and shoulders but also moves around to different body parts. Her left total knee replacement is doing fairly well. She's been taking tramadol 1-2 tablets a day when necessary as needed for pain. She mostly takes it for her flares. He states during the fibromyalgia flare if her pain level is on 8 on 0-10 the tramadol brings it down to 3.  Activities of Daily Living:  Patient reports morning stiffness for 5 minutes.   Patient Reports nocturnal pain.  Difficulty dressing/grooming: Denies Difficulty climbing stairs: Denies Difficulty getting out of chair: Reports Difficulty using hands for taps, buttons, cutlery, and/or writing: Denies   Review of Systems  Constitutional: Positive for fatigue. Negative for night sweats, weight gain, weight loss and weakness.  HENT: Negative for mouth sores, trouble swallowing, trouble swallowing, mouth dryness and nose dryness.   Eyes: Negative for pain, redness, visual disturbance and dryness.  Respiratory: Negative for cough, shortness of breath and difficulty breathing.   Cardiovascular: Negative for chest pain, palpitations, hypertension, irregular heartbeat and swelling in legs/feet.  Gastrointestinal: Negative for blood in stool, constipation and diarrhea.  Endocrine: Negative for increased urination.  Genitourinary: Negative for vaginal dryness.  Musculoskeletal: Positive for myalgias, morning stiffness and myalgias. Negative for arthralgias, joint pain, joint swelling, muscle weakness and muscle  tenderness.  Skin: Negative for color change, rash, hair loss, skin tightness, ulcers and sensitivity to sunlight.  Allergic/Immunologic: Negative for susceptible to infections.  Neurological: Negative for dizziness, memory loss and night sweats.  Hematological: Negative for swollen glands.  Psychiatric/Behavioral: Positive for depressed mood. The patient is not nervous/anxious.     PMFS History:  Patient Active Problem List   Diagnosis Date Noted  . Fibromyalgia 01/02/2017  . Other fatigue 01/02/2017  . Primary insomnia 01/02/2017  . Primary osteoarthritis of both knees 01/02/2017  . History of diabetes mellitus 01/02/2017  . History of hyperlipidemia 01/02/2017  . Primary osteoarthritis of left knee 11/25/2014  . Diabetes mellitus type 2 in obese ([facility]) 11/25/2014  . Severe obesity (BMI >= 40) ([facility]) 11/25/2014  . Hypertension 11/25/2014  . S/P total knee replacement using cement 11/25/2014  . RUQ PAIN 03/14/2007    Past Medical History:  Diagnosis Date  . Arthritis    knees, knees, neck, shoulders, hands   . Diabetes mellitus without complication ([facility])    borderline, states she is using Victoza for weight loss, Rx by PCP  . Fibromyalgia    followed by Dr. Corliss Skainseveshwar  . Hypertension     No family history on file. Past Surgical History:  Procedure Laterality Date  . ABDOMINAL HYSTERECTOMY  2000  . MYOMECTOMY     several surgeries for this problem  . TONSILLECTOMY    . TOTAL KNEE ARTHROPLASTY Left 11/25/2014   dr whitfield  . TOTAL KNEE ARTHROPLASTY Left 11/25/2014   Procedure: TOTAL KNEE ARTHROPLASTY;  Surgeon: Valeria BatmanPeter W Whitfield, MD;  [facility];  Service: Orthopedics;  Laterality: Left;   Social History   Social History Narrative  . No narrative on file     Objective: Vital Signs: BP 130/88   Pulse 86   Resp 16   Ht 5\' 6"  (1.676 m)   Wt 300 lb (136.1 kg)   BMI 48.42 kg/m    Physical Exam  Constitutional: She is oriented to person, place, and time. She  appears well-developed and well-nourished.  HENT:  Head: Normocephalic and atraumatic.  Eyes: Conjunctivae and EOM are normal.  Neck: Normal range of motion.  Cardiovascular: Normal rate, regular rhythm, normal heart sounds and intact distal pulses.   Pulmonary/Chest: Effort normal and breath sounds normal.  Abdominal: Soft. Bowel sounds are normal.  Lymphadenopathy:    She has no cervical adenopathy.  Neurological: She is alert and oriented to person, place, and time.  Skin: Skin is warm and dry. Capillary refill takes less than 2 seconds.  Psychiatric: She has a normal mood and affect. Her behavior is normal.  Nursing note and vitals reviewed.    Musculoskeletal Exam: C-spine, thoracic, lumbar spine good range of motion. Shoulder joints elbow joints wrist joint MCPs PIPs DIPs with good range of motion with no synovitis. Hip joints knee joints ankles MTPs PIPs with good range of motion with no synovitis. Her left total knee replacement looks good with some warmth.  CDAI Exam: No CDAI exam completed.    Investigation: Findings:  UDS 12/24/2015   Narcotic agreement 07/2016  Last labs were from December 24, 2015.  At that time, a urine drug screen was negative.  CBC with diff, CMP with GFR were also normal        Imaging: No results found.  Speciality Comments: No specialty comments available.    Procedures:  No procedures performed Allergies: Patient has no known allergies.   Assessment / Plan:     Visit Diagnoses: Fibromyalgia: She has generalized pain and discomfort and positive tender points.  Other fatigue: She continues to have fatigue.  Primary insomnia: Good sleep hygiene was discussed.  Primary osteoarthritis of both knees she continues to have some discomfort in her bilateral knee joints  Status post total left knee replacement using cement: She still have some discomfort in her left knee  History of diabetes mellitus  History of  hyperlipidemia  Medication monitoring encounter. She is on tramadol and Celebrex for pain management. Side effects of both medications were again reviewed. She's unable to function on a regular basis without these medications. - Plan: CBC with Differential/Platelet, COMPLETE METABOLIC PANEL WITH GFR, Pain Mgmt, Profile 5 w/Conf, U    Orders: Orders Placed This Encounter  Procedures  . CBC with Differential/Platelet  . COMPLETE METABOLIC PANEL WITH GFR  . Pain Mgmt, Profile 5 w/Conf, U   No orders of the defined types were placed in this encounter.   Face-to-face time spent with patient was 30 minutes. 50% of time was spent in counseling and coordination of care.  Follow-Up Instructions: Return in about 6 months (around 07/13/2017) for Osteoarthritis, fibromyalgia.   Pollyann Savoy, MD  Note - This record has been created using Animal nutritionist.  Chart creation errors have been sought, but may not always  have been located. Such creation errors do not reflect on  the standard of medical care.

## 2017-01-10 ENCOUNTER — Encounter: Payer: Self-pay | Admitting: Rheumatology

## 2017-01-10 ENCOUNTER — Ambulatory Visit (INDEPENDENT_AMBULATORY_CARE_PROVIDER_SITE_OTHER): Payer: 59 | Admitting: Rheumatology

## 2017-01-10 VITALS — BP 130/88 | HR 86 | Resp 16 | Ht 66.0 in | Wt 300.0 lb

## 2017-01-10 DIAGNOSIS — Z8639 Personal history of other endocrine, nutritional and metabolic disease: Secondary | ICD-10-CM | POA: Diagnosis not present

## 2017-01-10 DIAGNOSIS — Z96652 Presence of left artificial knee joint: Secondary | ICD-10-CM

## 2017-01-10 DIAGNOSIS — M797 Fibromyalgia: Secondary | ICD-10-CM | POA: Diagnosis not present

## 2017-01-10 DIAGNOSIS — Z5181 Encounter for therapeutic drug level monitoring: Secondary | ICD-10-CM | POA: Diagnosis not present

## 2017-01-10 DIAGNOSIS — R5383 Other fatigue: Secondary | ICD-10-CM

## 2017-01-10 DIAGNOSIS — M170 Bilateral primary osteoarthritis of knee: Secondary | ICD-10-CM | POA: Diagnosis not present

## 2017-01-10 DIAGNOSIS — F5101 Primary insomnia: Secondary | ICD-10-CM

## 2017-01-10 LAB — CBC WITH DIFFERENTIAL/PLATELET
BASOS ABS: 0 {cells}/uL (ref 0–200)
BASOS PCT: 0 %
EOS ABS: 364 {cells}/uL (ref 15–500)
Eosinophils Relative: 4 %
HEMATOCRIT: 40.4 % (ref 35.0–45.0)
HEMOGLOBIN: 13.2 g/dL (ref 11.7–15.5)
LYMPHS ABS: 3458 {cells}/uL (ref 850–3900)
Lymphocytes Relative: 38 %
MCH: 26.6 pg — ABNORMAL LOW (ref 27.0–33.0)
MCHC: 32.7 g/dL (ref 32.0–36.0)
MCV: 81.3 fL (ref 80.0–100.0)
MPV: 10.2 fL (ref 7.5–12.5)
Monocytes Absolute: 546 cells/uL (ref 200–950)
Monocytes Relative: 6 %
Neutro Abs: 4732 cells/uL (ref 1500–7800)
Neutrophils Relative %: 52 %
Platelets: 249 10*3/uL (ref 140–400)
RBC: 4.97 MIL/uL (ref 3.80–5.10)
RDW: 13.8 % (ref 11.0–15.0)
WBC: 9.1 10*3/uL (ref 3.8–10.8)

## 2017-01-10 LAB — COMPLETE METABOLIC PANEL WITH GFR
ALT: 15 U/L (ref 6–29)
AST: 21 U/L (ref 10–35)
Albumin: 4.1 g/dL (ref 3.6–5.1)
Alkaline Phosphatase: 50 U/L (ref 33–130)
BILIRUBIN TOTAL: 0.3 mg/dL (ref 0.2–1.2)
BUN: 15 mg/dL (ref 7–25)
CALCIUM: 9.4 mg/dL (ref 8.6–10.4)
CHLORIDE: 103 mmol/L (ref 98–110)
CO2: 30 mmol/L (ref 20–31)
Creat: 0.81 mg/dL (ref 0.50–1.05)
GFR, Est African American: >89 mL/min (ref >=60)
GFR, Est Non African American: 81 mL/min (ref >=60)
GLUCOSE: 93 mg/dL (ref 65–99)
Potassium: 4.3 mmol/L (ref 3.5–5.3)
Sodium: 141 mmol/L (ref 135–146)
Total Protein: 6.8 g/dL (ref 6.1–8.1)

## 2017-01-11 LAB — PAIN MGMT, PROFILE 5 W/CONF, U
Amphetamines: NEGATIVE ng/mL (ref <500)
Barbiturates: NEGATIVE ng/mL (ref <300)
Benzodiazepines: NEGATIVE ng/mL (ref <100)
Cocaine Metabolite: NEGATIVE ng/mL (ref <150)
Creatinine: 141.8 mg/dL (ref >=20.0)
METHADONE METABOLITE: NEGATIVE ng/mL (ref <100)
Marijuana Metabolite: NEGATIVE ng/mL (ref <20)
OXYCODONE: NEGATIVE ng/mL (ref <100)
Opiates: NEGATIVE ng/mL (ref <100)
Oxidant: NEGATIVE ug/mL (ref <200)
PH: 6.87 (ref 4.5–9.0)

## 2017-01-12 NOTE — Progress Notes (Signed)
Potassium low, glucose elevated. Notify patient and her PCP

## 2017-02-15 DIAGNOSIS — E782 Mixed hyperlipidemia: Secondary | ICD-10-CM | POA: Diagnosis not present

## 2017-02-15 DIAGNOSIS — E119 Type 2 diabetes mellitus without complications: Secondary | ICD-10-CM | POA: Diagnosis not present

## 2017-02-26 DIAGNOSIS — T7840XA Allergy, unspecified, initial encounter: Secondary | ICD-10-CM | POA: Diagnosis not present

## 2017-03-22 ENCOUNTER — Other Ambulatory Visit: Payer: Self-pay | Admitting: *Deleted

## 2017-03-22 MED ORDER — TRAMADOL HCL 50 MG PO TABS: ORAL_TABLET | ORAL | 1 refills | Status: DC

## 2017-03-22 NOTE — Telephone Encounter (Signed)
ok 

## 2017-03-22 NOTE — Telephone Encounter (Signed)
Refill request received via fax  Last Visit: 01/10/17 Next Visit: 07/17/17 UDS: 01/10/17 Narc Agreement: 07/17/16  Okay to refill Tramadol?

## 2017-06-05 DIAGNOSIS — R42 Dizziness and giddiness: Secondary | ICD-10-CM | POA: Diagnosis not present

## 2017-06-23 ENCOUNTER — Other Ambulatory Visit: Payer: Self-pay | Admitting: *Deleted

## 2017-06-23 MED ORDER — LIDOCAINE 5 % EX PTCH: 1.0000 | MEDICATED_PATCH | Freq: Two times a day (BID) | CUTANEOUS | 5 refills | Status: DC | PRN

## 2017-06-23 NOTE — Telephone Encounter (Signed)
Refill request received via fax for Lidocaine patches   Last Visit: 01/10/17 Next Visit: 07/17/17  Okay to refill per Dr. Corliss Skains

## 2017-07-09 NOTE — Progress Notes (Signed)
Office Visit Note  Patient: Marie [name]             Date of Birth: Oct 05, 1961           MRN: [id number]             PCP: Lupita Raider, MD Referring: Lupita Raider, MD Visit Date: 07/17/2017 Occupation: @    Subjective:  Generalized pain.   History of Present Illness: [name] is a 56 y.o. female with history of fibromyalgia and osteoarthritis. She states she's doing fairly well. She does have generalized pain from fibromyalgia which is tolerable. Her left total knee replacement is doing well. She has off-and-on pain in her right knee joint. She takes tramadol 1-2 tablets by mouth when necessary based on the pain. She also takes Celebrex once a day. She describes her pain level without tramadol on the scale of 0-10 about 8-9 with tramadol about 2-3.  Activities of Daily Living:  Patient reports morning stiffness for 5 minutes.   Patient Reports nocturnal pain.  Difficulty dressing/grooming: Denies Difficulty climbing stairs: Reports Difficulty getting out of chair: Reports Difficulty using hands for taps, buttons, cutlery, and/or writing: Denies   Review of Systems  Constitutional: Positive for fatigue. Negative for night sweats, weight gain, weight loss and weakness.  HENT: Negative for mouth sores, trouble swallowing, trouble swallowing, mouth dryness and nose dryness.   Eyes: Negative for pain, redness, visual disturbance and dryness.  Respiratory: Negative for cough, shortness of breath and difficulty breathing.   Cardiovascular: Positive for hypertension. Negative for chest pain, palpitations, irregular heartbeat and swelling in legs/feet.  Gastrointestinal: Negative for blood in stool, constipation and diarrhea.  Endocrine: Negative for increased urination.  Genitourinary: Negative for vaginal dryness.  Musculoskeletal: Positive for arthralgias, joint pain, myalgias, morning stiffness and myalgias. Negative for joint swelling, muscle weakness and  muscle tenderness.  Skin: Negative for color change, rash, hair loss, skin tightness, ulcers and sensitivity to sunlight.  Allergic/Immunologic: Negative for susceptible to infections.  Neurological: Negative for dizziness, memory loss and night sweats.  Hematological: Negative for swollen glands.  Psychiatric/Behavioral: Negative for depressed mood and sleep disturbance. The patient is not nervous/anxious.     PMFS History:  Patient Active Problem List   Diagnosis Date Noted  . Fibromyalgia 01/02/2017  . Other fatigue 01/02/2017  . Primary insomnia 01/02/2017  . Primary osteoarthritis of both knees 01/02/2017  . History of diabetes mellitus 01/02/2017  . History of hyperlipidemia 01/02/2017  . Primary osteoarthritis of left knee 11/25/2014  . Diabetes mellitus type 2 in obese (HCC) 11/25/2014  . Severe obesity (BMI >= 40) (HCC) 11/25/2014  . Hypertension 11/25/2014  . S/P total knee replacement using cement 11/25/2014  . RUQ PAIN 03/14/2007    Past Medical History:  Diagnosis Date  . Arthritis    knees, knees, neck, shoulders, hands   . Diabetes mellitus without complication (HCC)    borderline, states she is using Victoza for weight loss, Rx by PCP  . Fibromyalgia    followed by Dr. Corliss Skains  . Hypertension     No family history on file. Past Surgical History:  Procedure Laterality Date  . ABDOMINAL HYSTERECTOMY  2000  . MYOMECTOMY     several surgeries for this problem  . TONSILLECTOMY    . TOTAL KNEE ARTHROPLASTY Left 11/25/2014   dr whitfield  . TOTAL KNEE ARTHROPLASTY Left 11/25/2014   Procedure: TOTAL KNEE ARTHROPLASTY;  Surgeon: Valeria Batman, MD;  Location: [facility] OR;  Service: Orthopedics;  Laterality: Left;   Social History   Social History Narrative  . No narrative on file     Objective: Vital Signs: BP 138/80   Pulse 78   Resp 16   Ht 5\' 6"  (1.676 m)   Wt 300 lb (136.1 kg)   BMI 48.42 kg/m    Physical Exam  Constitutional: She is oriented to  person, place, and time. She appears well-developed and well-nourished.  HENT:  Head: Normocephalic and atraumatic.  Eyes: Conjunctivae and EOM are normal.  Neck: Normal range of motion.  Cardiovascular: Normal rate, regular rhythm, normal heart sounds and intact distal pulses.   Pulmonary/Chest: Effort normal and breath sounds normal.  Abdominal: Soft. Bowel sounds are normal.  Lymphadenopathy:    She has no cervical adenopathy.  Neurological: She is alert and oriented to person, place, and time.  Skin: Skin is warm and dry. Capillary refill takes less than 2 seconds.  Psychiatric: She has a normal mood and affect. Her behavior is normal.  Nursing note and vitals reviewed.    Musculoskeletal Exam: C-spine, thoracic, lumbar spine good range of motion. Shoulder joints elbow joints wrist joint MCPs PIPs DIPs with good range of motion. She is some crepitus with range of motion of her right knee joint. Her left total knee replacement is doing well. All other joints were full range of motion with no synovitis.  CDAI Exam: No CDAI exam completed.    Investigation: No additional findings.  UDS 01/10/2017 CBC Latest Ref Rng & Units 01/10/2017 11/27/2014 11/26/2014  WBC 3.8 - 10.8 K/uL 9.1 9.4 8.1  Hemoglobin 11.7 - 15.5 g/dL 96.013.2 10.1(L) 10.4(L)  Hematocrit 35.0 - 45.0 % 40.4 31.1(L) 32.2(L)  Platelets 140 - 400 K/uL 249 194 188   CMP Latest Ref Rng & Units 01/10/2017 11/27/2014 11/26/2014  Glucose 65 - 99 mg/dL 93 454(U186(H) 981(X145(H)  BUN 7 - 25 mg/dL 15 6 9   Creatinine 0.50 - 1.05 mg/dL 9.140.81 7.820.78 9.560.83  Sodium 135 - 146 mmol/L 141 135 136  Potassium 3.5 - 5.3 mmol/L 4.3 3.4(L) 3.3(L)  Chloride 98 - 110 mmol/L 103 100 102  CO2 20 - 31 mmol/L 30 27 27   Calcium 8.6 - 10.4 mg/dL 9.4 8.4 2.1(H8.1(L)  Total Protein 6.1 - 8.1 g/dL 6.8 - -  Total Bilirubin 0.2 - 1.2 mg/dL 0.3 - -  Alkaline Phos 33 - 130 U/L 50 - -  AST 10 - 35 U/L 21 - -  ALT 6 - 29 U/L 15 - -    Imaging: No results  found.  Speciality Comments: No specialty comments available.    Procedures:  No procedures performed Allergies: Patient has no known allergies.   Assessment / Plan:     Visit Diagnoses: Fibromyalgia: She continues to have some pain and discomfort from fibromyalgia. She does have some generalized pain and positive tender points. The pain is manageable with tramadol and Celebrex. Indications contraindications of both medications were again reviewed.  Other fatigue - Plan: VITAMIN D 25 Hydroxy (Vit-D Deficiency, Fractures)  Primary insomnia: Good sleep hygiene was discussed.  Primary osteoarthritis of right knee: Chronic pain. She may benefit from weight loss.  Status post total left knee replacement using cement - 2016: Dr. Cleophas DunkerWhitfield: Doing well Medication monitoring encounter - She is on tramadol and Celebrex for pain management.  - Plan: CBC with Differential/Platelet, COMPLETE METABOLIC PANEL WITH GFR  History of diabetes mellitus  History of hyperlipidemia   Class III obesity: Weight loss diet  and exercise was discussed.   Orders: Orders Placed This Encounter  Procedures  . CBC with Differential/Platelet  . COMPLETE METABOLIC PANEL WITH GFR  . VITAMIN D 25 Hydroxy (Vit-D Deficiency, Fractures)   No orders of the defined types were placed in this encounter.     Follow-Up Instructions: Return in about 6 months (around 01/14/2018) for Osteoarthritis FMS.   Pollyann Savoy, MD  Note - This record has been created using Animal nutritionist.  Chart creation errors have been sought, but may not always  have been located. Such creation errors do not reflect on  the standard of medical care.

## 2017-07-17 ENCOUNTER — Ambulatory Visit (INDEPENDENT_AMBULATORY_CARE_PROVIDER_SITE_OTHER): Payer: 59 | Admitting: Rheumatology

## 2017-07-17 ENCOUNTER — Encounter: Payer: Self-pay | Admitting: Rheumatology

## 2017-07-17 VITALS — BP 138/80 | HR 78 | Resp 16 | Ht 66.0 in | Wt 300.0 lb

## 2017-07-17 DIAGNOSIS — M797 Fibromyalgia: Secondary | ICD-10-CM | POA: Diagnosis not present

## 2017-07-17 DIAGNOSIS — Z8639 Personal history of other endocrine, nutritional and metabolic disease: Secondary | ICD-10-CM

## 2017-07-17 DIAGNOSIS — Z96652 Presence of left artificial knee joint: Secondary | ICD-10-CM | POA: Diagnosis not present

## 2017-07-17 DIAGNOSIS — Z5181 Encounter for therapeutic drug level monitoring: Secondary | ICD-10-CM

## 2017-07-17 DIAGNOSIS — Z6841 Body Mass Index (BMI) 40.0 and over, adult: Secondary | ICD-10-CM

## 2017-07-17 DIAGNOSIS — M1711 Unilateral primary osteoarthritis, right knee: Secondary | ICD-10-CM

## 2017-07-17 DIAGNOSIS — R5383 Other fatigue: Secondary | ICD-10-CM | POA: Diagnosis not present

## 2017-07-17 DIAGNOSIS — F5101 Primary insomnia: Secondary | ICD-10-CM

## 2017-07-18 ENCOUNTER — Telehealth: Payer: Self-pay | Admitting: *Deleted

## 2017-07-18 DIAGNOSIS — E559 Vitamin D deficiency, unspecified: Secondary | ICD-10-CM

## 2017-07-18 LAB — CBC WITH DIFFERENTIAL/PLATELET
Basophils Absolute: 53 cells/uL (ref 0–200)
Basophils Relative: 0.8 %
EOS PCT: 5.3 %
Eosinophils Absolute: 350 cells/uL (ref 15–500)
HEMATOCRIT: 38.7 % (ref 35.0–45.0)
Hemoglobin: 12.8 g/dL (ref 11.7–15.5)
Lymphs Abs: 2185 cells/uL (ref 850–3900)
MCH: 26.7 pg — ABNORMAL LOW (ref 27.0–33.0)
MCHC: 33.1 g/dL (ref 32.0–36.0)
MCV: 80.6 fL (ref 80.0–100.0)
MONOS PCT: 6.5 %
MPV: 10.8 fL (ref 7.5–12.5)
NEUTROS ABS: 3584 {cells}/uL (ref 1500–7800)
NEUTROS PCT: 54.3 %
PLATELETS: 238 10*3/uL (ref 140–400)
RBC: 4.8 10*6/uL (ref 3.80–5.10)
RDW: 12.5 % (ref 11.0–15.0)
Total Lymphocyte: 33.1 %
WBC mixed population: 429 cells/uL (ref 200–950)
WBC: 6.6 10*3/uL (ref 3.8–10.8)

## 2017-07-18 LAB — COMPLETE METABOLIC PANEL WITH GFR
AG Ratio: 1.6 (calc) (ref 1.0–2.5)
ALKALINE PHOSPHATASE (APISO): 51 U/L (ref 33–130)
ALT: 20 U/L (ref 6–29)
AST: 23 U/L (ref 10–35)
Albumin: 4.1 g/dL (ref 3.6–5.1)
BUN: 17 mg/dL (ref 7–25)
CALCIUM: 9.5 mg/dL (ref 8.6–10.4)
CO2: 29 mmol/L (ref 20–32)
Chloride: 106 mmol/L (ref 98–110)
Creat: 0.82 mg/dL (ref 0.50–1.05)
GFR, EST NON AFRICAN AMERICAN: 79 mL/min/{1.73_m2} (ref >=60)
GFR, Est African American: 92 mL/min/{1.73_m2} (ref >=60)
GLOBULIN: 2.5 g/dL (ref 1.9–3.7)
GLUCOSE: 131 mg/dL — AB (ref 65–99)
Potassium: 4.9 mmol/L (ref 3.5–5.3)
SODIUM: 141 mmol/L (ref 135–146)
Total Bilirubin: 0.4 mg/dL (ref 0.2–1.2)
Total Protein: 6.6 g/dL (ref 6.1–8.1)

## 2017-07-18 LAB — VITAMIN D 25 HYDROXY (VIT D DEFICIENCY, FRACTURES): Vit D, 25-Hydroxy: 22 ng/mL — ABNORMAL LOW (ref 30–100)

## 2017-07-18 MED ORDER — VITAMIN D (ERGOCALCIFEROL) 1.25 MG (50000 UNIT) PO CAPS: 50000.0000 [IU] | ORAL_CAPSULE | ORAL | 0 refills | Status: DC

## 2017-07-18 NOTE — Telephone Encounter (Signed)
-----   Message from Pollyann Savoy, MD sent at 07/18/2017  8:50 AM EDT ----- Vitamin D is low. Please call vitamin D 50,000 units once a week for 3 months. And repeat lab in 3 months. Rest of the labs are stable

## 2017-07-18 NOTE — Progress Notes (Signed)
Vitamin D is low. Please call vitamin D 50,000 units once a week for 3 months. And repeat lab in 3 months. Rest of the labs are stable

## 2017-08-14 ENCOUNTER — Other Ambulatory Visit: Payer: Self-pay

## 2017-08-14 MED ORDER — TRAMADOL HCL 50 MG PO TABS: ORAL_TABLET | ORAL | 1 refills | Status: DC

## 2017-08-14 NOTE — Telephone Encounter (Signed)
Last visit: 07/17/17 Next visit: 01/16/18 UDS: 01/10/17  Narc agreement: 07/27/17  Ok to refill Tramadol?

## 2017-08-14 NOTE — Telephone Encounter (Signed)
Patient would like a Rx refill on Tramadol.  CB# is [phone].  Please advise.  Thank You.

## 2017-08-14 NOTE — Telephone Encounter (Signed)
ok 

## 2017-08-14 NOTE — Addendum Note (Signed)
Addended by: Henriette Combs on: 08/14/2017 02:22 PM   Modules accepted: Orders

## 2017-08-30 DIAGNOSIS — Z0000 Encounter for general adult medical examination without abnormal findings: Secondary | ICD-10-CM | POA: Diagnosis not present

## 2017-08-30 DIAGNOSIS — E782 Mixed hyperlipidemia: Secondary | ICD-10-CM | POA: Diagnosis not present

## 2017-08-30 DIAGNOSIS — I10 Essential (primary) hypertension: Secondary | ICD-10-CM | POA: Diagnosis not present

## 2017-08-30 DIAGNOSIS — Z23 Encounter for immunization: Secondary | ICD-10-CM | POA: Diagnosis not present

## 2017-09-04 DIAGNOSIS — Z1231 Encounter for screening mammogram for malignant neoplasm of breast: Secondary | ICD-10-CM | POA: Diagnosis not present

## 2017-09-07 ENCOUNTER — Other Ambulatory Visit: Payer: Self-pay | Admitting: Rheumatology

## 2017-09-08 NOTE — Telephone Encounter (Signed)
Last Visit: 07/17/17 Next visit: 01/16/18 Labs: 07/17/17 stable  Okay to refill per Dr. Corliss Skainseveshwar

## 2017-10-06 ENCOUNTER — Other Ambulatory Visit: Payer: Self-pay | Admitting: Rheumatology

## 2017-10-06 NOTE — Telephone Encounter (Signed)
Left message to advise patient she will need labs before refilling medication.

## 2017-10-10 ENCOUNTER — Other Ambulatory Visit: Payer: Self-pay | Admitting: Rheumatology

## 2017-10-12 NOTE — Telephone Encounter (Signed)
Patient advised she will need to update labs before we can refill.

## 2017-10-18 ENCOUNTER — Other Ambulatory Visit: Payer: Self-pay

## 2017-10-18 DIAGNOSIS — E559 Vitamin D deficiency, unspecified: Secondary | ICD-10-CM

## 2017-10-19 LAB — VITAMIN D 25 HYDROXY (VIT D DEFICIENCY, FRACTURES): Vit D, 25-Hydroxy: 29 ng/mL — ABNORMAL LOW (ref 30–100)

## 2017-10-19 NOTE — Progress Notes (Signed)
Call and vitamin D 50,000 units twice a week for 90 days. Recheck vitamin D level after 3 months.

## 2017-11-02 ENCOUNTER — Telehealth: Payer: Self-pay | Admitting: *Deleted

## 2017-11-02 DIAGNOSIS — E559 Vitamin D deficiency, unspecified: Secondary | ICD-10-CM

## 2017-11-02 MED ORDER — VITAMIN D (ERGOCALCIFEROL) 1.25 MG (50000 UNIT) PO CAPS: 50000.0000 [IU] | ORAL_CAPSULE | ORAL | 0 refills | Status: DC

## 2017-11-02 NOTE — Telephone Encounter (Signed)
-----   Message from Pollyann SavoyShaili Deveshwar, MD sent at 10/19/2017  8:40 AM EST ----- Call and vitamin D 50,000 units twice a week for 90 days. Recheck vitamin D level after 3 months.

## 2017-11-27 ENCOUNTER — Other Ambulatory Visit: Payer: Self-pay | Admitting: Rheumatology

## 2017-11-27 NOTE — Telephone Encounter (Signed)
Last visit: 07/17/17 Next visit: 01/16/18 UDS: 01/10/17  Narc agreement: 07/27/17  Ok to refill Tramadol?

## 2017-11-28 ENCOUNTER — Telehealth: Payer: Self-pay

## 2017-11-28 NOTE — Telephone Encounter (Signed)
[facility] for LIDOCAIN PATCHS from 11/28/17 to 05/28/2018.   Reference number: [id number] Phone number: [phone]  Will send document to scan center.  Called pt to update. Patient voices understanding and denies any questions at this time.   Ninoska Goswick, [place], CPhT 11:35 AM

## 2017-11-28 NOTE — Telephone Encounter (Signed)
Received a prior authorization request for Lidocaine patches from [facility]. Authorization was submitted to insurance via cover my meds. Will update once we receive a response.   Antolin Belsito, [place], CPhT 9:15 AM

## 2017-11-29 ENCOUNTER — Other Ambulatory Visit: Payer: Self-pay | Admitting: Rheumatology

## 2017-11-29 NOTE — Telephone Encounter (Signed)
Last visit: 07/17/17 Next visit: 01/16/18 Labs: 07/17/17 stable  Okay to refill per Dr. Corliss Skainseveshwar

## 2017-12-11 DIAGNOSIS — E782 Mixed hyperlipidemia: Secondary | ICD-10-CM | POA: Diagnosis not present

## 2017-12-11 DIAGNOSIS — I10 Essential (primary) hypertension: Secondary | ICD-10-CM | POA: Diagnosis not present

## 2017-12-11 DIAGNOSIS — E1165 Type 2 diabetes mellitus with hyperglycemia: Secondary | ICD-10-CM | POA: Diagnosis not present

## 2018-01-03 NOTE — Progress Notes (Signed)
Office Visit Note  Patient: Marie GrossKathy O Weppler             Date of Birth: [birth year]           MRN: [id number]             PCP: Lupita RaiderShaw, Kimberlee, MD Referring: Lupita RaiderShaw, Kimberlee, MD Visit Date: 01/16/2018 Occupation: @GUAROCC @    Subjective:  Neck pain    History of Present Illness: Marie [name] is a 57 y.o. female with history of fibromyalgia and osteoarthritis.  She has been having increased muscle tenderness and muscle tension over the past few weeks and, she attributes this to weather changes.  She states her muscle tenderness is most severe in the trapezius and shoulder region.  She has some stiffness in her neck as well.  She states her insomnia and fatigue have improved.  She has some days where she feels more fatigued than others.  Patient states she takes tramadol 50 mg 1-2 tablets daily.  She says she has been having to take 2 tablets daily due to her increased.  She also takes Celebrex 200 mg once daily. She has also been using Lidoderm 5% patches. She denies any joint pain or joint swelling at this time.  She states she will sometimes have some stiffness in her left knee has been replaced.  She states she does have good range of motion of her left knee though.  She states she has been having muscle cramps at night.  She says she has tried magnesium malate in the past.  She states that if she tries taking more potassium actually makes her cramps worse. She has been taking vitamin D 50,000 units twice a week for the past 3 months.  Her last dose is this week.   Activities of Daily Living:  Patient reports morning stiffness for 2 hours.   Patient Reports nocturnal pain.  Difficulty dressing/grooming: Denies Difficulty climbing stairs: Reports Difficulty getting out of chair: Reports Difficulty using hands for taps, buttons, cutlery, and/or writing: Denies   Review of Systems  Constitutional: Negative for fatigue, fever, night sweats and weakness.  HENT: Negative for mouth  sores, mouth dryness and nose dryness.   Eyes: Negative for pain, redness, visual disturbance and dryness.  Respiratory: Negative for cough, shortness of breath and difficulty breathing.   Cardiovascular: Negative for chest pain, palpitations, hypertension and swelling in legs/feet.  Gastrointestinal: Negative for blood in stool, constipation and diarrhea.  Endocrine: Negative for increased urination.  Genitourinary: Negative for pelvic pain.  Musculoskeletal: Positive for arthralgias, joint pain and morning stiffness. Negative for joint swelling, myalgias, muscle weakness, muscle tenderness and myalgias.  Skin: Negative for color change, rash, hair loss, nodules/bumps, redness, skin tightness, ulcers and sensitivity to sunlight.  Allergic/Immunologic: Negative for susceptible to infections.  Neurological: Positive for memory loss. Negative for dizziness, numbness, headaches and night sweats.  Hematological: Negative for bruising/bleeding tendency.  Psychiatric/Behavioral: Positive for confusion. Negative for depressed mood and sleep disturbance. The patient is not nervous/anxious.     PMFS History:  Patient Active Problem List   Diagnosis Date Noted  . Fibromyalgia 01/02/2017  . Other fatigue 01/02/2017  . Primary insomnia 01/02/2017  . Primary osteoarthritis of both knees 01/02/2017  . History of diabetes mellitus 01/02/2017  . History of hyperlipidemia 01/02/2017  . Primary osteoarthritis of left knee 11/25/2014  . Diabetes mellitus type 2 in obese (HCC) 11/25/2014  . Severe obesity (BMI >= 40) (HCC) 11/25/2014  . Hypertension 11/25/2014  .  S/P total knee replacement using cement 11/25/2014  . RUQ PAIN 03/14/2007    Past Medical History:  Diagnosis Date  . Arthritis    knees, knees, neck, shoulders, hands   . Diabetes mellitus without complication (HCC)    borderline, states she is using Victoza for weight loss, Rx by PCP  . Fibromyalgia    followed by Dr. Corliss Skains  .  Hypertension     Family History  Problem Relation Age of Onset  . Diabetes Mother   . Cancer Mother   . Diabetes Father    Past Surgical History:  Procedure Laterality Date  . ABDOMINAL HYSTERECTOMY  2000  . MYOMECTOMY     several surgeries for this problem  . TONSILLECTOMY    . TOTAL KNEE ARTHROPLASTY Left 11/25/2014   dr whitfield  . TOTAL KNEE ARTHROPLASTY Left 11/25/2014   Procedure: TOTAL KNEE ARTHROPLASTY;  Surgeon: Valeria Batman, MD;  Location: [facility] OR;  Service: Orthopedics;  Laterality: Left;   Social History   Social History Narrative  . Not on file     Objective: Vital Signs: BP 133/82 (BP Location: Left Arm, Patient Position: Sitting, Cuff Size: Large)   Pulse 70   Resp 18   Ht 5\' 6"  (1.676 m)   Wt (!) 302 lb (137 kg)   BMI 48.74 kg/m    Physical Exam  Constitutional: She is oriented to person, place, and time. She appears well-developed and well-nourished.  HENT:  Head: Normocephalic and atraumatic.  Eyes: Conjunctivae and EOM are normal.  Neck: Normal range of motion.  Cardiovascular: Normal rate, regular rhythm, normal heart sounds and intact distal pulses.  Pulmonary/Chest: Effort normal and breath sounds normal.  Abdominal: Soft. Bowel sounds are normal.  Lymphadenopathy:    She has no cervical adenopathy.  Neurological: She is alert and oriented to person, place, and time.  Skin: Skin is warm and dry. Capillary refill takes less than 2 seconds.  Psychiatric: She has a normal mood and affect. Her behavior is normal.  Nursing note and vitals reviewed.    Musculoskeletal Exam: C-spine, thoracic spine, lumbar spine good range of motion.  No midline spinal tenderness.  No SI joint tenderness.  Shoulder joints, elbow joints, wrist joints, MCPs, PIPs, DIPs good range of motion with no synovitis.  She has DIP synovial thickening consistent with osteoarthritis.  Hip joints, knee joints, ankle joints, MTPs, PIPs, DIPs good range of motion with no  synovitis.  No warmth or effusion of bilateral knees.  No knee crepitus.  She has right trochanteric bursa tenderness.  CDAI Exam: No CDAI exam completed.    Investigation: No additional findings. CBC Latest Ref Rng & Units 07/17/2017 01/10/2017 11/27/2014  WBC 3.8 - 10.8 Thousand/uL 6.6 9.1 9.4  Hemoglobin 11.7 - 15.5 g/dL 14.7 82.9 10.1(L)  Hematocrit 35.0 - 45.0 % 38.7 40.4 31.1(L)  Platelets 140 - 400 Thousand/uL 238 249 194   CMP Latest Ref Rng & Units 07/17/2017 01/10/2017 11/27/2014  Glucose 65 - 99 mg/dL 562(Z) 93 308(M)  BUN 7 - 25 mg/dL 17 15 6   Creatinine 0.50 - 1.05 mg/dL 5.78 4.69 6.29  Sodium 135 - 146 mmol/L 141 141 135  Potassium 3.5 - 5.3 mmol/L 4.9 4.3 3.4(L)  Chloride 98 - 110 mmol/L 106 103 100  CO2 20 - 32 mmol/L 29 30 27   Calcium 8.6 - 10.4 mg/dL 9.5 9.4 8.4  Total Protein 6.1 - 8.1 g/dL 6.6 6.8 -  Total Bilirubin 0.2 - 1.2 mg/dL 0.4 0.3 -  Alkaline Phos 33 - 130 U/L - 50 -  AST 10 - 35 U/L 23 21 -  ALT 6 - 29 U/L 20 15 -    Imaging: No results found.  Speciality Comments: No specialty comments available.    Procedures:  No procedures performed Allergies: Patient has no known allergies.   Assessment / Plan:     Visit Diagnoses: Fibromyalgia: She has muscle tenderness and muscle tension in the trapezius region bilaterally.  She has some neck stiffness. A handout of neck exercises was given to the patient today.  She takes tramadol 50 mg 1-2 tablets daily and Celebrex 200 mg 1 tablet daily for pain relief.  She has muscle cramps at night.  She has tried magnesium malate in the past.  She was advised to try magnesium malate 250 mg at bedtime.  If she tolerates the 250 mg dose she can increase to 500 mg at bedtime.  Side effects were discussed.  Primary osteoarthritis of right knee: No warmth or effusion on exam.  She has good range of motion.  She has had occasional discomfort in her right knee.  She is trying to put off having a knee replacement as long as  she can.  Status post total left knee replacement using cement - 2016: Dr. Cleophas Dunker: Doing well.  She has good range of motion exam no warmth or effusion.  Primary insomnia: Improved.   Other fatigue: She has intermittent fatigue. Overall her fatigue has improved.    Medication monitoring encounter -She takes  Tramadol 1-2 tablets daily.  UDS was updated today 01/16/2018. Narc agreement: 07/17/2017.  CBC and CMP were ordered today to monitor for drug toxicity.  She takes Celebrex 1 tablet daily.- Plan: Pain Mgmt, Profile 5 w/Conf, U, Pain Mgmt, Tramadol w/medMATCH, U, CBC with Differential/Platelet, COMPLETE METABOLIC PANEL WITH GFR  Vitamin D deficiency -Vitamin D level was checked today.  She has been taking vitamin D 50,000 units twice a week.  Plan: VITAMIN D 25 Hydroxy (Vit-D Deficiency, Fractures)   Other medical conditions are listed as follows:  History of hyperlipidemia  History of diabetes mellitus  History of obesity  History of hypertension     Orders: Orders Placed This Encounter  Procedures  . Pain Mgmt, Profile 5 w/Conf, U  . Pain Mgmt, Tramadol w/medMATCH, U  . VITAMIN D 25 Hydroxy (Vit-D Deficiency, Fractures)  . CBC with Differential/Platelet  . COMPLETE METABOLIC PANEL WITH GFR   No orders of the defined types were placed in this encounter.    Follow-Up Instructions: Return in about 6 months (around 07/19/2018) for Fibromyalgia, Osteoarthritis.   Gearldine Bienenstock, PA-C  Note - This record has been created using Dragon software.  Chart creation errors have been sought, but may not always  have been located. Such creation errors do not reflect on  the standard of medical care.

## 2018-01-16 ENCOUNTER — Encounter: Payer: Self-pay | Admitting: Rheumatology

## 2018-01-16 ENCOUNTER — Ambulatory Visit: Payer: 59 | Admitting: Physician Assistant

## 2018-01-16 VITALS — BP 133/82 | HR 70 | Resp 18 | Ht 66.0 in | Wt 302.0 lb

## 2018-01-16 DIAGNOSIS — E559 Vitamin D deficiency, unspecified: Secondary | ICD-10-CM | POA: Diagnosis not present

## 2018-01-16 DIAGNOSIS — F5101 Primary insomnia: Secondary | ICD-10-CM | POA: Diagnosis not present

## 2018-01-16 DIAGNOSIS — Z8679 Personal history of other diseases of the circulatory system: Secondary | ICD-10-CM

## 2018-01-16 DIAGNOSIS — M797 Fibromyalgia: Secondary | ICD-10-CM

## 2018-01-16 DIAGNOSIS — M1711 Unilateral primary osteoarthritis, right knee: Secondary | ICD-10-CM

## 2018-01-16 DIAGNOSIS — Z5181 Encounter for therapeutic drug level monitoring: Secondary | ICD-10-CM | POA: Diagnosis not present

## 2018-01-16 DIAGNOSIS — Z8639 Personal history of other endocrine, nutritional and metabolic disease: Secondary | ICD-10-CM

## 2018-01-16 DIAGNOSIS — Z96652 Presence of left artificial knee joint: Secondary | ICD-10-CM | POA: Diagnosis not present

## 2018-01-16 DIAGNOSIS — R5383 Other fatigue: Secondary | ICD-10-CM

## 2018-01-16 NOTE — Patient Instructions (Signed)
Neck Exercises Neck exercises can be important for many reasons:  They can help you to improve and maintain flexibility in your neck. This can be especially important as you age.  They can help to make your neck stronger. This can make movement easier.  They can reduce or prevent neck pain.  They may help your upper back.  Ask your health care provider which neck exercises would be best for you. Exercises Neck Press Repeat this exercise 10 times. Do it first thing in the morning and right before bed or as told by your health care provider. 1. Lie on your back on a firm bed or on the floor with a pillow under your head. 2. Use your neck muscles to push your head down on the pillow and straighten your spine. 3. Hold the position as well as you can. Keep your head facing up and your chin tucked. 4. Slowly count to 5 while holding this position. 5. Relax for a few seconds. Then repeat.  Isometric Strengthening Do a full set of these exercises 2 times a day or as told by your health care provider. 1. Sit in a supportive chair and place your hand on your forehead. 2. Push forward with your head and neck while pushing back with your hand. Hold for 10 seconds. 3. Relax. Then repeat the exercise 3 times. 4. Next, do thesequence again, this time putting your hand against the back of your head. Use your head and neck to push backward against the hand pressure. 5. Finally, do the same exercise on either side of your head, pushing sideways against the pressure of your hand.  Prone Head Lifts Repeat this exercise 5 times. Do this 2 times a day or as told by your health care provider. 1. Lie face-down, resting on your elbows so that your chest and upper back are raised. 2. Start with your head facing downward, near your chest. Position your chin either on or near your chest. 3. Slowly lift your head upward. Lift until you are looking straight ahead. Then continue lifting your head as far back as  you can stretch. 4. Hold your head up for 5 seconds. Then slowly lower it to your starting position.  Supine Head Lifts Repeat this exercise 8-10 times. Do this 2 times a day or as told by your health care provider. 1. Lie on your back, bending your knees to point to the ceiling and keeping your feet flat on the floor. 2. Lift your head slowly off the floor, raising your chin toward your chest. 3. Hold for 5 seconds. 4. Relax and repeat.  Scapular Retraction Repeat this exercise 5 times. Do this 2 times a day or as told by your health care provider. 1. Stand with your arms at your sides. Look straight ahead. 2. Slowly pull both shoulders backward and downward until you feel a stretch between your shoulder blades in your upper back. 3. Hold for 10-30 seconds. 4. Relax and repeat.  Contact a health care provider if:  Your neck pain or discomfort gets much worse when you do an exercise.  Your neck pain or discomfort does not improve within 2 hours after you exercise. If you have any of these problems, stop exercising right away. Do not do the exercises again unless your health care provider says that you can. Get help right away if:  You develop sudden, severe neck pain. If this happens, stop exercising right away. Do not do the exercises again unless your   health care provider says that you can. Exercises Neck Stretch  Repeat this exercise 3-5 times. 1. Do this exercise while standing or while sitting in a chair. 2. Place your feet flat on the floor, shoulder-width apart. 3. Slowly turn your head to the right. Turn it all the way to the right so you can look over your right shoulder. Do not tilt or tip your head. 4. Hold this position for 10-30 seconds. 5. Slowly turn your head to the left, to look over your left shoulder. 6. Hold this position for 10-30 seconds.  Neck Retraction Repeat this exercise 8-10 times. Do this 3-4 times a day or as told by your health care  provider. 1. Do this exercise while standing or while sitting in a sturdy chair. 2. Look straight ahead. Do not bend your neck. 3. Use your fingers to push your chin backward. Do not bend your neck for this movement. Continue to face straight ahead. If you are doing the exercise properly, you will feel a slight sensation in your throat and a stretch at the back of your neck. 4. Hold the stretch for 1-2 seconds. Relax and repeat.  This information is not intended to replace advice given to you by your health care provider. Make sure you discuss any questions you have with your health care provider. Document Released: 09/28/2015 Document Revised: 03/24/2016 Document Reviewed: 04/27/2015 Elsevier Interactive Patient Education  2018 [employer]  

## 2018-01-18 LAB — CBC WITH DIFFERENTIAL/PLATELET
BASOS PCT: 0.9 %
Basophils Absolute: 87 cells/uL (ref 0–200)
EOS PCT: 4.4 %
Eosinophils Absolute: 427 cells/uL (ref 15–500)
HCT: 38.6 % (ref 35.0–45.0)
Hemoglobin: 13 g/dL (ref 11.7–15.5)
LYMPHS ABS: 3841 {cells}/uL (ref 850–3900)
MCH: 26.7 pg — ABNORMAL LOW (ref 27.0–33.0)
MCHC: 33.7 g/dL (ref 32.0–36.0)
MCV: 79.4 fL — ABNORMAL LOW (ref 80.0–100.0)
MPV: 10.8 fL (ref 7.5–12.5)
Monocytes Relative: 6.5 %
NEUTROS PCT: 48.6 %
Neutro Abs: 4714 cells/uL (ref 1500–7800)
PLATELETS: 279 10*3/uL (ref 140–400)
RBC: 4.86 10*6/uL (ref 3.80–5.10)
RDW: 12.5 % (ref 11.0–15.0)
TOTAL LYMPHOCYTE: 39.6 %
WBC: 9.7 10*3/uL (ref 3.8–10.8)
WBCMIX: 631 {cells}/uL (ref 200–950)

## 2018-01-18 LAB — PAIN MGMT, PROFILE 5 W/CONF, U
Amphetamines: NEGATIVE ng/mL (ref <500)
BARBITURATES: NEGATIVE ng/mL (ref <300)
Benzodiazepines: NEGATIVE ng/mL (ref <100)
Cocaine Metabolite: NEGATIVE ng/mL (ref <150)
Creatinine: 180.2 mg/dL
MARIJUANA METABOLITE: NEGATIVE ng/mL (ref <20)
METHADONE METABOLITE: NEGATIVE ng/mL (ref <100)
OXIDANT: NEGATIVE ug/mL (ref <200)
OXYCODONE: NEGATIVE ng/mL (ref <100)
Opiates: NEGATIVE ng/mL (ref <100)
PH: 6.92 (ref 4.5–9.0)

## 2018-01-18 LAB — COMPLETE METABOLIC PANEL WITH GFR
AG RATIO: 1.4 (calc) (ref 1.0–2.5)
ALBUMIN MSPROF: 4.2 g/dL (ref 3.6–5.1)
ALKALINE PHOSPHATASE (APISO): 49 U/L (ref 33–130)
ALT: 15 U/L (ref 6–29)
AST: 19 U/L (ref 10–35)
BUN: 15 mg/dL (ref 7–25)
CO2: 29 mmol/L (ref 20–32)
CREATININE: 0.81 mg/dL (ref 0.50–1.05)
Calcium: 9.6 mg/dL (ref 8.6–10.4)
Chloride: 105 mmol/L (ref 98–110)
GFR, Est African American: 93 mL/min/{1.73_m2} (ref >=60)
GFR, Est Non African American: 81 mL/min/{1.73_m2} (ref >=60)
GLOBULIN: 2.9 g/dL (ref 1.9–3.7)
Glucose, Bld: 103 mg/dL — ABNORMAL HIGH (ref 65–99)
POTASSIUM: 4.2 mmol/L (ref 3.5–5.3)
Sodium: 141 mmol/L (ref 135–146)
Total Bilirubin: 0.3 mg/dL (ref 0.2–1.2)
Total Protein: 7.1 g/dL (ref 6.1–8.1)

## 2018-01-18 LAB — VITAMIN D 25 HYDROXY (VIT D DEFICIENCY, FRACTURES): Vit D, 25-Hydroxy: 39 ng/mL (ref 30–100)

## 2018-01-18 LAB — PAIN MGMT, TRAMADOL W/MEDMATCH, U
Desmethyltramadol: 1939 ng/mL — ABNORMAL HIGH (ref <100)
TRAMADOL: 3723 ng/mL — AB (ref <100)

## 2018-01-18 NOTE — Progress Notes (Signed)
UDS consistent with treatment.   Vitamin D low.  Please send in Vitamin D 50,000 units once a week x 3 months.  We will recheck vitamin D in 3 months. All other labs are stable.

## 2018-01-25 ENCOUNTER — Telehealth: Payer: Self-pay | Admitting: Rheumatology

## 2018-01-25 DIAGNOSIS — E559 Vitamin D deficiency, unspecified: Secondary | ICD-10-CM

## 2018-01-25 MED ORDER — VITAMIN D (ERGOCALCIFEROL) 1.25 MG (50000 UNIT) PO CAPS: 50000.0000 [IU] | ORAL_CAPSULE | ORAL | 0 refills | Status: DC

## 2018-01-25 NOTE — Telephone Encounter (Signed)
Patient advised of lab results and prescription for vitamin d sent to the pharmacy. Lab order placed for patient to have vitamin d rechecked in 3 months.

## 2018-01-25 NOTE — Telephone Encounter (Signed)
Patient left a voicemail returning your call.  °

## 2018-03-22 DIAGNOSIS — I10 Essential (primary) hypertension: Secondary | ICD-10-CM | POA: Diagnosis not present

## 2018-03-22 DIAGNOSIS — E1169 Type 2 diabetes mellitus with other specified complication: Secondary | ICD-10-CM | POA: Diagnosis not present

## 2018-03-23 ENCOUNTER — Other Ambulatory Visit (HOSPITAL_COMMUNITY): Payer: Self-pay | Admitting: General Surgery

## 2018-03-26 ENCOUNTER — Ambulatory Visit (HOSPITAL_COMMUNITY)
Admission: EM | Admit: 2018-03-26 | Discharge: 2018-03-26 | Disposition: A | Discharge status: Home / Self Care | Payer: 59 | Attending: Family Medicine | Admitting: Family Medicine

## 2018-03-26 ENCOUNTER — Encounter (HOSPITAL_COMMUNITY): Payer: Self-pay | Admitting: Family Medicine

## 2018-03-26 DIAGNOSIS — R05 Cough: Secondary | ICD-10-CM | POA: Insufficient documentation

## 2018-03-26 DIAGNOSIS — E119 Type 2 diabetes mellitus without complications: Secondary | ICD-10-CM | POA: Diagnosis not present

## 2018-03-26 DIAGNOSIS — M797 Fibromyalgia: Secondary | ICD-10-CM | POA: Diagnosis not present

## 2018-03-26 DIAGNOSIS — M1990 Unspecified osteoarthritis, unspecified site: Secondary | ICD-10-CM | POA: Insufficient documentation

## 2018-03-26 DIAGNOSIS — Z79899 Other long term (current) drug therapy: Secondary | ICD-10-CM | POA: Diagnosis not present

## 2018-03-26 DIAGNOSIS — Z96652 Presence of left artificial knee joint: Secondary | ICD-10-CM | POA: Diagnosis not present

## 2018-03-26 DIAGNOSIS — J029 Acute pharyngitis, unspecified: Secondary | ICD-10-CM | POA: Diagnosis present

## 2018-03-26 DIAGNOSIS — J069 Acute upper respiratory infection, unspecified: Secondary | ICD-10-CM | POA: Diagnosis not present

## 2018-03-26 DIAGNOSIS — Z90710 Acquired absence of both cervix and uterus: Secondary | ICD-10-CM | POA: Insufficient documentation

## 2018-03-26 DIAGNOSIS — Z833 Family history of diabetes mellitus: Secondary | ICD-10-CM | POA: Insufficient documentation

## 2018-03-26 DIAGNOSIS — Z7982 Long term (current) use of aspirin: Secondary | ICD-10-CM | POA: Diagnosis not present

## 2018-03-26 DIAGNOSIS — B9789 Other viral agents as the cause of diseases classified elsewhere: Secondary | ICD-10-CM | POA: Diagnosis not present

## 2018-03-26 DIAGNOSIS — I10 Essential (primary) hypertension: Secondary | ICD-10-CM | POA: Insufficient documentation

## 2018-03-26 LAB — POCT RAPID STREP A: Streptococcus, Group A Screen (Direct): NEGATIVE

## 2018-03-26 MED ORDER — BENZONATATE 100 MG PO CAPS: 100.0000 mg | ORAL_CAPSULE | Freq: Two times a day (BID) | ORAL | 0 refills | Status: DC | PRN

## 2018-03-26 MED ORDER — CETIRIZINE-PSEUDOEPHEDRINE ER 5-120 MG PO TB12: 1.0000 | ORAL_TABLET | Freq: Every day | ORAL | 0 refills | Status: DC

## 2018-03-26 NOTE — ED Triage Notes (Signed)
Pt here with C/O sore throat cough, congestion x 1 week.

## 2018-03-26 NOTE — Discharge Instructions (Addendum)
Strep was negative.  We will send it for culture and notify you if the results are abnormal Drink plenty of water and rest Prescribed zyrtec-d.  Take as directed Prescribed tessalon perles take as needed for symptomatic relief of cough Perform salt water gargles at home Follow up with PCP if symptoms persist Return or go to ER if you have any new or worsening symptoms

## 2018-03-26 NOTE — ED Provider Notes (Addendum)
[facility]   [id number] 03/26/18 Arrival Time: 1327  SUBJECTIVE: History from: patient.  CHERYLENE FERRUFINO is a 57 y.o. female who presents with sore throat, sneezing, and mild productive cough with green/yellow sputum for 1 week.  Denies positive sick exposure or precipitating event.  Denies hx of allergies.  Denies alleviating and aggravating factors.  Denies previous symptoms in the past.   Complains of sinus pressure and wheezing.  Denies fever, chills, fatigue, rhinorrhea, SOB, chest pain, nausea, vomiting, abdominal pain, changes in bowel or bladder habits.    ROS: As per HPI.  Past Medical History:  Diagnosis Date  . Arthritis    knees, knees, neck, shoulders, hands   . Diabetes mellitus without complication (HCC)    borderline, states she is using Victoza for weight loss, Rx by PCP  . Fibromyalgia    followed by Dr. Corliss Skains  . Hypertension    Past Surgical History:  Procedure Laterality Date  . ABDOMINAL HYSTERECTOMY  2000  . MYOMECTOMY     several surgeries for this problem  . TONSILLECTOMY    . TOTAL KNEE ARTHROPLASTY Left 11/25/2014   dr whitfield  . TOTAL KNEE ARTHROPLASTY Left 11/25/2014   Procedure: TOTAL KNEE ARTHROPLASTY;  Surgeon: Valeria Batman, MD;  Location: [facility] OR;  Service: Orthopedics;  Laterality: Left;   No Known Allergies No current facility-administered medications on file prior to encounter.    Current Outpatient Medications on File Prior to Encounter  Medication Sig Dispense Refill  . aspirin 81 MG tablet Take 81 mg by mouth daily.    . CELEBREX 200 MG capsule TAKE 1 CAPSULE BY MOUTH EVERY DAY 30 capsule 2  . estradiol (ESTRACE) 1 MG tablet Take 1 mg by mouth at bedtime.   13  . lidocaine (LIDODERM) 5 % Place 1-3 patches onto the skin every 12 (twelve) hours as needed (pain). 90 patch 5  . Liraglutide 18 MG/3ML SOPN Inject 1.8 mg into the skin daily. For weight control    . losartan-hydrochlorothiazide (HYZAAR) 100-25 MG per tablet  Take 0.5 tablets by mouth at bedtime.   5  . Omega-3 Fatty Acids (FISH OIL) 1000 MG CAPS Take by mouth.    Marland Kitchen OZEMPIC 0.25 or 0.5 MG/DOSE SOPN INJECT 0.5 MILLIGRAMS INTO THE SKIN ONCE A WEEK  11  . simvastatin (ZOCOR) 40 MG tablet Take 40 mg by mouth at bedtime.   5  . traMADol (ULTRAM) 50 MG tablet TAKE 1 TO 2 TABLETS BY MOUTH EVERY DAY 60 tablet 1  . TRULICITY 1.5 MG/0.5ML SOPN once a week.     . Vitamin D, Ergocalciferol, (DRISDOL) 50000 units CAPS capsule Take 1 capsule (50,000 Units total) by mouth every 7 (seven) days. 12 capsule 0   Social History   Socioeconomic History  . Marital status: Single    Spouse name: Not on file  . Number of children: Not on file  . Years of education: Not on file  . Highest education level: Not on file  Occupational History  . Not on file  Social Needs  . Financial resource strain: Not on file  . Food insecurity:    Worry: Not on file    Inability: Not on file  . Transportation needs:    Medical: Not on file    Non-medical: Not on file  Tobacco Use  . Smoking status: Never Smoker  . Smokeless tobacco: Never Used  Substance and Sexual Activity  . Alcohol use: No  . Drug use: No  .  Sexual activity: Not on file  Lifestyle  . Physical activity:    Days per week: Not on file    Minutes per session: Not on file  . Stress: Not on file  Relationships  . Social connections:    Talks on phone: Not on file    Gets together: Not on file    Attends religious service: Not on file    Active member of club or organization: Not on file    Attends meetings of clubs or organizations: Not on file    Relationship status: Not on file  . Intimate partner violence:    Fear of current or ex partner: Not on file    Emotionally abused: Not on file    Physically abused: Not on file    Forced sexual activity: Not on file  Other Topics Concern  . Not on file  Social History Narrative  . Not on file   Family History  Problem Relation Age of Onset  .  Diabetes Mother   . Cancer Mother   . Diabetes Father     OBJECTIVE:  Vitals:   03/26/18 1438  BP: (!) 119/49  Pulse: 82  Resp: 18  Temp: 98.3 F (36.8 C)  SpO2: 98%     General appearance: alert; in no acute distress HEENT: Ears: EACs clear, TMs pearly gray with visible cone of light, without erythema; Eyes: PERRL, EOMI grossly; Sinuses nontender to palpation; Nose: clear rhinorrhea; Throat: Tonsils 2+ and erythematous with white tonsillar exudates, uvula midline Neck: supple without LAD Lungs: CTA bilaterally without adventitious breath sounds Heart: regular rate and rhythm.  Radial pulses 2+ symmetrical bilaterally Skin: warm and dry Psychological: alert and cooperative; normal mood and affect  Results for orders placed or performed during the hospital encounter of 03/26/18 (from the past 24 hour(s))  POCT rapid strep A [facility])     Status: None   Collection Time: 03/26/18  4:14 PM  Result Value Ref Range   Streptococcus, Group A Screen (Direct) NEGATIVE NEGATIVE    ASSESSMENT & PLAN:  1. Viral URI with cough   2. Sore throat     Meds ordered this encounter  Medications  . cetirizine-pseudoephedrine (ZYRTEC-D) 5-120 MG tablet    Sig: Take 1 tablet by mouth daily.    Dispense:  30 tablet    Refill:  0    Order Specific Question:   Supervising Provider    Answer:   Isa Rankin [phone]  . benzonatate (TESSALON) 100 MG capsule    Sig: Take 1 capsule (100 mg total) by mouth 2 (two) times daily as needed for cough.    Dispense:  15 capsule    Refill:  0    Order Specific Question:   Supervising Provider    Answer:   Isa Rankin [[id number]]    Strep was negative.  We will send it for culture and notify you if the results are abnormal Drink plenty of water and rest Prescribed zyrtec-d.  Take as directed Prescribed tessalon perles take as needed for symptomatic relief of cough Perform salt water gargles at home Follow up with PCP if  symptoms persist Return or go to ER if you have any new or worsening symptoms    Reviewed expectations re: course of current medical issues. Questions answered. Outlined signs and symptoms indicating need for more acute intervention. Patient verbalized understanding. After Visit Summary given.         Rennis Harding, PA-C 03/26/18 1638  Rennis Harding, PA-C 03/26/18 1639

## 2018-03-29 LAB — CULTURE, GROUP A STREP (THRC)

## 2018-04-09 ENCOUNTER — Ambulatory Visit (HOSPITAL_COMMUNITY)
Admission: RE | Admit: 2018-04-09 | Discharge: 2018-04-09 | Disposition: A | Discharge status: Home / Self Care | Payer: 59 | Source: Ambulatory Visit | Attending: General Surgery | Admitting: General Surgery

## 2018-04-09 ENCOUNTER — Other Ambulatory Visit: Payer: Self-pay

## 2018-04-09 DIAGNOSIS — Z01818 Encounter for other preprocedural examination: Secondary | ICD-10-CM | POA: Diagnosis not present

## 2018-04-14 ENCOUNTER — Other Ambulatory Visit: Payer: Self-pay | Admitting: Rheumatology

## 2018-04-16 DIAGNOSIS — I10 Essential (primary) hypertension: Secondary | ICD-10-CM | POA: Diagnosis not present

## 2018-04-16 DIAGNOSIS — E782 Mixed hyperlipidemia: Secondary | ICD-10-CM | POA: Diagnosis not present

## 2018-04-16 DIAGNOSIS — E1169 Type 2 diabetes mellitus with other specified complication: Secondary | ICD-10-CM | POA: Diagnosis not present

## 2018-04-16 NOTE — Telephone Encounter (Signed)
Left message to advise patient will need update lab before refill

## 2018-04-23 ENCOUNTER — Encounter: Payer: 59 | Attending: General Surgery | Admitting: Skilled Nursing Facility1

## 2018-04-23 ENCOUNTER — Other Ambulatory Visit: Payer: Self-pay

## 2018-04-23 ENCOUNTER — Encounter: Payer: Self-pay | Admitting: Skilled Nursing Facility1

## 2018-04-23 ENCOUNTER — Ambulatory Visit: Payer: 59 | Admitting: Skilled Nursing Facility1

## 2018-04-23 DIAGNOSIS — E559 Vitamin D deficiency, unspecified: Secondary | ICD-10-CM

## 2018-04-23 DIAGNOSIS — Z6841 Body Mass Index (BMI) 40.0 and over, adult: Secondary | ICD-10-CM | POA: Insufficient documentation

## 2018-04-23 DIAGNOSIS — E1169 Type 2 diabetes mellitus with other specified complication: Secondary | ICD-10-CM

## 2018-04-23 DIAGNOSIS — E669 Obesity, unspecified: Secondary | ICD-10-CM

## 2018-04-23 DIAGNOSIS — Z713 Dietary counseling and surveillance: Secondary | ICD-10-CM | POA: Insufficient documentation

## 2018-04-23 NOTE — Progress Notes (Addendum)
Pre-Op Assessment Visit:  Pre-Operative RYGB Surgery  Medical Nutrition Therapy:  Appt start time: 9:19  End time:  10:27  Patient was seen on 04/23/2018 for Pre-Operative Nutrition Assessment. Assessment and [facility] on 04/23/2018.   Pt states her doctor recently told her she does have diabetes now but she does not know her A1C and dietitian does not have access to that information. Pt states her daughter was over 600 pounds and got bariatric surgery.  Pt needs 7 total visits per referral.   Pt expectation of surgery: to feel better   Pt expectation of Dietitian: none  Start weight at [facility]: 295.4 pounds BMI: 46.96  24 hr Dietary Recall: First Meal: skipped Snack: chips Second Meal: out: meat and 2 sides or sandwich Snack: chips or nuts or cookies Third Meal: cabbage casserole, macaroni salad, pork Snack: popcorn or ice cream or popcicle Beverages:  flavored water, water, soda  Encouraged to engage in 150 minutes of moderate physical activity including cardiovascular and weight baring weekly  Handouts given during visit include:  . Pre-Op Goals . Bariatric Surgery Protein Shakes During the appointment today the following Pre-Op Goals were reviewed with the patient: . Maintain or lose weight as instructed by your surgeon . Make healthy food choices . Begin to limit portion sizes . Limited concentrated sugars and fried foods . Keep fat/sugar in the single digits per serving on             food labels . Practice CHEWING your food  (aim for 30 chews per bite or until applesauce consistency) . Practice not drinking 15 minutes before, during, and 30 minutes after each meal/snack . Avoid all carbonated beverages  . Avoid/limit caffeinated beverages  . Avoid all sugar-sweetened beverages . Consume 3 meals per day; eat every 3-5 hours: have a fruit and nuts for breakfast . Make a list of non-food  related activities . Aim for 64-100 ounces of FLUID daily  . Aim for at least 60-80 grams of PROTEIN daily . Look for a liquid protein source that contain ?15 g protein and ?5 g carbohydrate  (ex: shakes, drinks, shots) . More physical activity   -Follow diet recommendations listed below   Energy and Macronutrient Recomendations: Calories: 1600 Carbohydrate: 180 Protein: 120 Fat: 44  Demonstrated degree of understanding via:  Teach Back  Teaching Method Utilized:  Visual Auditory Hands on  Barriers to learning/adherence to lifestyle change: none identified   Patient to call the Nutrition and [facility] Education Services to enroll in Pre-Op and Post-Op Nutrition Education when surgery date is scheduled.

## 2018-04-24 ENCOUNTER — Telehealth: Payer: Self-pay | Admitting: *Deleted

## 2018-04-24 LAB — VITAMIN D 25 HYDROXY (VIT D DEFICIENCY, FRACTURES): Vit D, 25-Hydroxy: 32 ng/mL (ref 30–100)

## 2018-04-24 MED ORDER — VITAMIN D (ERGOCALCIFEROL) 1.25 MG (50000 UNIT) PO CAPS: 50000.0000 [IU] | ORAL_CAPSULE | ORAL | 1 refills | Status: DC

## 2018-04-24 NOTE — Progress Notes (Signed)
Please call in vitamin D 50,000 units twice a month.  We will recheck vitamin D levels in 6 months.

## 2018-04-24 NOTE — Telephone Encounter (Signed)
Vitamin D sent to pharmacy.

## 2018-04-26 ENCOUNTER — Other Ambulatory Visit: Payer: Self-pay | Admitting: Rheumatology

## 2018-05-21 ENCOUNTER — Encounter: Payer: Self-pay | Admitting: Skilled Nursing Facility1

## 2018-05-21 ENCOUNTER — Encounter: Payer: 59 | Attending: General Surgery | Admitting: Skilled Nursing Facility1

## 2018-05-21 ENCOUNTER — Ambulatory Visit: Payer: 59 | Admitting: Skilled Nursing Facility1

## 2018-05-21 DIAGNOSIS — Z6841 Body Mass Index (BMI) 40.0 and over, adult: Secondary | ICD-10-CM | POA: Insufficient documentation

## 2018-05-21 DIAGNOSIS — E1169 Type 2 diabetes mellitus with other specified complication: Secondary | ICD-10-CM

## 2018-05-21 DIAGNOSIS — Z713 Dietary counseling and surveillance: Secondary | ICD-10-CM | POA: Insufficient documentation

## 2018-05-21 DIAGNOSIS — E669 Obesity, unspecified: Secondary | ICD-10-CM

## 2018-05-21 NOTE — Patient Instructions (Addendum)
-  Keep working on eating breakfast  -Keep chewing well  -Keep getting up more often at your desk  -Read your nutrition facts label to choose more nutritious snacks

## 2018-05-21 NOTE — Progress Notes (Signed)
RYGB Assessment:  1st SWL Appointment.   Pt states her doctor recently told her she does have diabetes now but she does not know her A1C and dietitian does not have access to that information. Pt states her daughter was over 600 pounds and got bariatric surgery.  Pt needs 7 total visits per referral.   Pt arrives having lost about 4 pounds. Pt states she has been working on chewing more and trying to eat breakfast and been getting up at her desk more often. Pt states she did cut out sparkling water.   Start weight at [facility]: 295.4 pounds Wt: 291.4 BMI: 46.96  24 hr Dietary Recall: First Meal: granola bar or belvita snack Snack: chips Second Meal: out: meat and 2 sides or sandwich Snack: chips or nuts or cookies Third Meal: cabbage casserole, macaroni salad, pork Snack: popcorn or ice cream or popcicle Beverages:  flavored water, water  Usual physical activity: trying to get up more at her desk   -Follow diet recommendations listed below   Energy and Macronutrient Recomendations: Calories: 1600 Carbohydrate: 180 Protein: 120 Fat: 44   Nutritional Diagnosis:  -3.3 Overweight/obesity related to past poor dietary habits and physical inactivity as evidenced by patient w/ planned RYGB surgery following dietary guidelines for continued weight loss.    Intervention:  Nutrition counseling for upcoming Bariatric Surgery. Goals: -Encouraged to engage in 150 minutes of moderate physical activity including cardiovascular and weight baring weekly -Keep working on eating breakfast -Keep chewing well -Keep getting up more often at your desk -Read your nutrition facts label to choose more nutritious snacks   Teaching Method Utilized:  Visual Auditory Hands on  Barriers to learning/adherence to lifestyle change: none identified   Demonstrated degree of understanding via:  Teach Back   Monitoring/Evaluation:  Dietary intake, exercise, and body weight prn.

## 2018-05-26 ENCOUNTER — Other Ambulatory Visit: Payer: Self-pay | Admitting: Rheumatology

## 2018-05-28 NOTE — Telephone Encounter (Signed)
Last Visit: 01/16/18 Next Visit: 07/16/18 Labs: 01/16/18 stable  Okay to refill per Dr. Corliss Skainseveshwar

## 2018-06-25 ENCOUNTER — Encounter: Payer: Self-pay | Admitting: Skilled Nursing Facility1

## 2018-06-25 ENCOUNTER — Encounter: Payer: 59 | Attending: General Surgery | Admitting: Skilled Nursing Facility1

## 2018-06-25 DIAGNOSIS — E1169 Type 2 diabetes mellitus with other specified complication: Secondary | ICD-10-CM

## 2018-06-25 DIAGNOSIS — Z6841 Body Mass Index (BMI) 40.0 and over, adult: Secondary | ICD-10-CM | POA: Diagnosis not present

## 2018-06-25 DIAGNOSIS — E669 Obesity, unspecified: Secondary | ICD-10-CM

## 2018-06-25 DIAGNOSIS — Z713 Dietary counseling and surveillance: Secondary | ICD-10-CM | POA: Diagnosis not present

## 2018-06-25 NOTE — Patient Instructions (Addendum)
-  Work on identifying when you are about to stress eat instead of eating out of hunger   Example of emotional eating: going back for seconds or snacking after dinner or feeling any emotion and going to food

## 2018-06-25 NOTE — Progress Notes (Signed)
RYGB Assessment:  2nd SWL Appointment.   Pt states her doctor recently told her she does have diabetes. Pt needs 7 total visits per referral.   Pt arrives having lost about 4 pounds. Pt states she has been working on chewing more and trying to eat breakfast and been getting up at her desk more often. Pt states she did cut out sparkling water.   Pt arrives having gained about 1 pound. Pt arrived with a baby and was giving it applejuice during the appt while it was fussy.  Pt states she is working on portion size, chewing well, and getting up at work. Pt states she is working on dinner time portions and eating breakfast and also snacking before bed. Strategy for dinner portion: half of what she normally does but if she likes it is not successful witth that or if the day has been stressful. Pt states she is recognizing now she stress eats.   Start weight at [facility]: 295.4 pounds Wt: 292.7 BMI: 46.54  24 hr Dietary Recall: First Meal: granola bar or belvita snack or grits or yogurt Snack: chips Second Meal: out: meat and 2 sides or sandwich Snack: chips or nuts or cookies Third Meal: cabbage casserole, macaroni salad, pork Snack: popcorn or ice cream or popcicle Beverages:  flavored water, water  Usual physical activity: trying to get up more at her desk   -Follow diet recommendations listed below   Energy and Macronutrient Recomendations: Calories: 1600 Carbohydrate: 180 Protein: 120 Fat: 44   Nutritional Diagnosis:  [place]-3.3 Overweight/obesity related to past poor dietary habits and physical inactivity as evidenced by patient w/ planned RYGB surgery following dietary guidelines for continued weight loss.    Intervention:  Nutrition counseling for upcoming Bariatric Surgery. Goals: -Encouraged to engage in 150 minutes of moderate physical activity including cardiovascular and weight baring weekly -Keep working on eating breakfast -Keep chewing well -Keep getting up more often at  your desk -Read your nutrition facts label to choose more nutritious snacks  -Work on identifying when you are about to stress eat instead of eating out of hunger  Using the should I eat sheet and emotions/needs  Example of emotional eating: going back for seconds or snacking after dinner or feeling any emotion and going to food  Teaching Method Utilized:  Visual Auditory Hands on  Barriers to learning/adherence to lifestyle change: none identified   Demonstrated degree of understanding via:  Teach Back   Monitoring/Evaluation:  Dietary intake, exercise, and body weight prn.

## 2018-07-03 NOTE — Progress Notes (Signed)
Office Visit Note  Patient: Marie [name]             Date of Birth: November 05, 1960           MRN: [id number]             PCP: Lupita Raider, MD Referring: Lupita Raider, MD Visit Date: 07/16/2018 Occupation: @GUAROCC @  Subjective:  Right knee pain   History of Present Illness: Marie [name] is a 56 y.o. female with history of fibromyalgia and osteoarthritis.  Patient takes tramadol 50 mg 1-2 tablets as needed for pain relief and Celebrex 200 mg 1 tablet po daily PRN.  She continues to have generalized muscle aches muscle tenderness due to fibromyalgia.  She states that she also has stiffness first thing in the morning.  She states that she continues to have good days and bad days due to fibromyalgia.  She states that she has intermittent swelling and discomfort in her left knee replacement.  She states she has not followed up with Dr. Cleophas Dunker recently.  She continues to have pain in her right knee joint as well as intermittent joint swelling.  She states that she has been Visco supplementation injections in the past which provided minimal relief.  She states that at night she has discomfort in her shoulders when she is lying on her side as well as bursitis.  She states that she continues to have interrupted sleep at night.  She states that her level of fatigue has been stable.    Activities of Daily Living:  Patient reports morning stiffness for 10 minutes.   Patient Reports nocturnal pain.  Difficulty dressing/grooming: Denies Difficulty climbing stairs: Reports Difficulty getting out of chair: Reports Difficulty using hands for taps, buttons, cutlery, and/or writing: Denies  Review of Systems  Constitutional: Negative for fatigue.  HENT: Negative for mouth sores, trouble swallowing, trouble swallowing, mouth dryness and nose dryness.   Eyes: Negative for pain, visual disturbance and dryness.  Respiratory: Negative for cough, hemoptysis, shortness of breath and difficulty  breathing.   Cardiovascular: Negative for chest pain, palpitations, hypertension and swelling in legs/feet.  Gastrointestinal: Negative for abdominal pain, blood in stool, constipation, diarrhea, nausea and vomiting.  Endocrine: Negative for increased urination.  Genitourinary: Negative for painful urination and nocturia.  Musculoskeletal: Positive for arthralgias, joint pain, joint swelling and morning stiffness. Negative for myalgias, muscle weakness, muscle tenderness and myalgias.  Skin: Negative for color change, pallor, rash, hair loss, nodules/bumps, skin tightness, ulcers and sensitivity to sunlight.  Allergic/Immunologic: Negative for susceptible to infections.  Neurological: Negative for dizziness, numbness, headaches, memory loss and weakness.  Hematological: Negative for bruising/bleeding tendency and swollen glands.  Psychiatric/Behavioral: Negative for depressed mood, confusion and sleep disturbance. The patient is not nervous/anxious.     PMFS History:  Patient Active Problem List   Diagnosis Date Noted  . Fibromyalgia 01/02/2017  . Other fatigue 01/02/2017  . Primary insomnia 01/02/2017  . Primary osteoarthritis of both knees 01/02/2017  . History of diabetes mellitus 01/02/2017  . History of hyperlipidemia 01/02/2017  . Primary osteoarthritis of left knee 11/25/2014  . Diabetes mellitus type 2 in obese (HCC) 11/25/2014  . Severe obesity (BMI >= 40) (HCC) 11/25/2014  . Hypertension 11/25/2014  . S/P total knee replacement using cement 11/25/2014  . RUQ PAIN 03/14/2007    Past Medical History:  Diagnosis Date  . Arthritis    knees, knees, neck, shoulders, hands   . Diabetes mellitus without complication (HCC)  borderline, states she is using Victoza for weight loss, Rx by PCP  . Fibromyalgia    followed by Dr. Corliss Skains  . Hypertension     Family History  Problem Relation Age of Onset  . Diabetes Mother   . Cancer Mother   . Diabetes Father    Past  Surgical History:  Procedure Laterality Date  . ABDOMINAL HYSTERECTOMY  2000  . MYOMECTOMY     several surgeries for this problem  . TONSILLECTOMY    . TOTAL KNEE ARTHROPLASTY Left 11/25/2014   dr whitfield  . TOTAL KNEE ARTHROPLASTY Left 11/25/2014   Procedure: TOTAL KNEE ARTHROPLASTY;  Surgeon: Valeria Batman, MD;  Location: [facility] OR;  Service: Orthopedics;  Laterality: Left;   Social History   Social History Narrative  . Not on file    Objective: Vital Signs: BP 135/83 (BP Location: Left Arm, Patient Position: Sitting, Cuff Size: Large)   Pulse 61   Resp 16   Ht 5\' 6"  (1.676 m)   Wt 298 lb 6.4 oz (135.4 kg)   BMI 48.16 kg/m    Physical Exam  Constitutional: She is oriented to person, place, and time. She appears well-developed and well-nourished.  HENT:  Head: Normocephalic and atraumatic.  Eyes: Conjunctivae and EOM are normal.  Neck: Normal range of motion.  Cardiovascular: Normal rate, regular rhythm, normal heart sounds and intact distal pulses.  Pulmonary/Chest: Effort normal and breath sounds normal.  Abdominal: Soft. Bowel sounds are normal.  Lymphadenopathy:    She has no cervical adenopathy.  Neurological: She is alert and oriented to person, place, and time.  Skin: Skin is warm and dry. Capillary refill takes less than 2 seconds.  Psychiatric: She has a normal mood and affect. Her behavior is normal.  Nursing note and vitals reviewed.    Musculoskeletal Exam: C-spine good range of motion.  Thoracic kyphosis noted.  Shoulder joints, elbow joints, wrist joints, MCPs, PIPs, DIPs good range of motion with no synovitis.  She has PIP and DIP synovial thickening consistent with osteoarthritis of bilateral hands.  Hip joints good range of motion with no discomfort.  Knee joints, ankle joints, MTPs, PIPs, and DIPs good ROM with no synovitis.  No warmth or effusion of knee joints.  Left knee replacement good ROM with discomfort.    CDAI Exam: CDAI Score: Not  documented Patient Global Assessment: Not documented; Provider Global Assessment: Not documented Swollen: Not documented; Tender: Not documented Joint Exam   Not documented   There is currently no information documented on the homunculus. Go to the Rheumatology activity and complete the homunculus joint exam.  Investigation: No additional findings.  Imaging: No results found.  Recent Labs: Lab Results  Component Value Date   WBC 9.7 01/16/2018   HGB 13.0 01/16/2018   PLT 279 01/16/2018   NA 141 01/16/2018   K 4.2 01/16/2018   CL 105 01/16/2018   CO2 29 01/16/2018   GLUCOSE 103 (H) 01/16/2018   BUN 15 01/16/2018   CREATININE 0.81 01/16/2018   BILITOT 0.3 01/16/2018   ALKPHOS 50 01/10/2017   AST 19 01/16/2018   ALT 15 01/16/2018   PROT 7.1 01/16/2018   ALBUMIN 4.1 01/10/2017   CALCIUM 9.6 01/16/2018   GFRAA 93 01/16/2018    Speciality Comments: No specialty comments available.  Procedures:  No procedures performed Allergies: Patient has no known allergies.   Assessment / Plan:     Visit Diagnoses: Fibromyalgia -She continues to have generalized muscle aches and muscle  tenderness.  She has chronic fatigue and insomnia.  She has been taking tramadol 50 mg 1-2 tablets po PRN and Celebrex 200 mg 1 tablet PRN for pain relief. She is going to have her PCP fax lab results to [place]. She does not need any refills at this time. UDS: 01/16/2018. Narc agreement: updated today.  She was encouraged to stay active and continue to exercise on a regular basis.  Primary osteoarthritis of right knee: No warmth or effusion.  She has good range of motion with some discomfort.  She is been having intermittent right knee pain and swelling.  We discussed reapplying for Visco supplementation injections.  She would like to hold off on this time.  She requested a cortisone injection today in the office but her diabetes has not been well controlled.  She will be following up with PCP Friday.  She was  advised to notify us if she would like [place] to apply for visco gel injections.  She is given a handout of knee exercises that she can perform at home.  Status post total left knee replacement using cement - 2016: Dr. Cleophas Dunker.  She continues to have intermittent discomfort and swelling in her left knee joint.  She has not followed up with Dr. Cleophas Dunker recently.  Other fatigue: Chronic.  She is encouraged to try to exercise and stay active on a regular basis.  Primary insomnia: She continues to have interrupted sleep at night.  Good sleep hygiene was discussed.  Vitamin D deficiency: She is on vitamin D 50,000 units by mouth twice a month.  Other medical conditions are listed as follows:  History of hypertension  History of diabetes mellitus  History of hyperlipidemia  History of obesity   Orders: No orders of the defined types were placed in this encounter.  No orders of the defined types were placed in this encounter.    Follow-Up Instructions: Return in about 6 months (around 01/14/2019) for Fibromyalgia.   Gearldine Bienenstock, PA-C  Note - This record has been created using Dragon software.  Chart creation errors have been sought, but may not always  have been located. Such creation errors do not reflect on  the standard of medical care.

## 2018-07-16 ENCOUNTER — Encounter: Payer: Self-pay | Admitting: Physician Assistant

## 2018-07-16 ENCOUNTER — Ambulatory Visit: Payer: 59 | Admitting: Physician Assistant

## 2018-07-16 VITALS — BP 135/83 | HR 61 | Resp 16 | Ht 66.0 in | Wt 298.4 lb

## 2018-07-16 DIAGNOSIS — M1711 Unilateral primary osteoarthritis, right knee: Secondary | ICD-10-CM

## 2018-07-16 DIAGNOSIS — F5101 Primary insomnia: Secondary | ICD-10-CM

## 2018-07-16 DIAGNOSIS — E559 Vitamin D deficiency, unspecified: Secondary | ICD-10-CM

## 2018-07-16 DIAGNOSIS — Z96652 Presence of left artificial knee joint: Secondary | ICD-10-CM

## 2018-07-16 DIAGNOSIS — M797 Fibromyalgia: Secondary | ICD-10-CM | POA: Diagnosis not present

## 2018-07-16 DIAGNOSIS — Z8679 Personal history of other diseases of the circulatory system: Secondary | ICD-10-CM

## 2018-07-16 DIAGNOSIS — Z8639 Personal history of other endocrine, nutritional and metabolic disease: Secondary | ICD-10-CM

## 2018-07-16 DIAGNOSIS — R5383 Other fatigue: Secondary | ICD-10-CM | POA: Diagnosis not present

## 2018-07-16 NOTE — Patient Instructions (Signed)

## 2018-07-20 DIAGNOSIS — E119 Type 2 diabetes mellitus without complications: Secondary | ICD-10-CM | POA: Diagnosis not present

## 2018-07-23 ENCOUNTER — Encounter: Payer: 59 | Attending: General Surgery | Admitting: Skilled Nursing Facility1

## 2018-07-23 ENCOUNTER — Encounter: Payer: Self-pay | Admitting: Skilled Nursing Facility1

## 2018-07-23 DIAGNOSIS — Z713 Dietary counseling and surveillance: Secondary | ICD-10-CM | POA: Insufficient documentation

## 2018-07-23 DIAGNOSIS — E1169 Type 2 diabetes mellitus with other specified complication: Secondary | ICD-10-CM

## 2018-07-23 DIAGNOSIS — Z6841 Body Mass Index (BMI) 40.0 and over, adult: Secondary | ICD-10-CM | POA: Diagnosis not present

## 2018-07-23 DIAGNOSIS — E669 Obesity, unspecified: Secondary | ICD-10-CM

## 2018-07-23 NOTE — Progress Notes (Signed)
RYGB Assessment: 3rd SWL Appointment.   Pt states her doctor recently told her she does have diabetes. Pt needs 7 total visits per referral.  Pt states she has been working on chewing more and trying to eat breakfast and been getting up at her desk more often. Pt states she did cut out sparkling water.   Pt arrives having maintained weight. Pt states this last month has been hard trying to watch and what and how much she eats. Pt states she lost the handouts from last time and states she has been under a lot of stress. Pt states she is proud of herself for eating breakfast now.   Start weight at [facility]: 295.4 pounds Wt: 292.7 BMI: 46.54  24 hr Dietary Recall: with boughts of emotional eating  First Meal: granola bar or belvita snack or grits or yogurt Snack: granola bar Second Meal: out: chicken pot pie Snack: chips or nuts or cookies Third Meal: skipped---steak and potato  Snack: popcorn or ice cream or popcicle Beverages:  flavored water, water, once a week soda or lemonade   Usual physical activity: trying to get up more at her desk   -Follow diet recommendations listed below   Energy and Macronutrient Recomendations: Calories: 1600 Carbohydrate: 180 Protein: 120 Fat: 44   Nutritional Diagnosis:  [place]-3.3 Overweight/obesity related to past poor dietary habits and physical inactivity as evidenced by patient w/ planned RYGB surgery following dietary guidelines for continued weight loss.    Intervention:  Nutrition counseling for upcoming Bariatric Surgery. Goals: -Encouraged to engage in 150 minutes of moderate physical activity including cardiovascular and weight baring weekly -Keep working on eating breakfast -Keep chewing well -Keep getting up more often at your desk -Read your nutrition facts label to choose more nutritious snacks  -Work on identifying when you are about to stress eat instead of eating out of hunger  Using the should I eat sheet and  emotions/needs  Example of emotional eating: going back for seconds or snacking after dinner or feeling any emotion and going to food  Teaching Method Utilized:  Visual Auditory Hands on  Barriers to learning/adherence to lifestyle change: none identified   Demonstrated degree of understanding via:  Teach Back   Monitoring/Evaluation:  Dietary intake, exercise, and body weight prn.

## 2018-08-06 ENCOUNTER — Other Ambulatory Visit: Payer: Self-pay | Admitting: Physician Assistant

## 2018-08-06 NOTE — Telephone Encounter (Signed)
Last visit: 07/16/18 Next Visit: 01/14/19 UDS: 01/16/18 Narc Agreement: 07/16/18  Okay to refill Tramadol?

## 2018-08-14 ENCOUNTER — Ambulatory Visit (INDEPENDENT_AMBULATORY_CARE_PROVIDER_SITE_OTHER): Payer: 59 | Admitting: Orthopaedic Surgery

## 2018-08-14 ENCOUNTER — Ambulatory Visit (INDEPENDENT_AMBULATORY_CARE_PROVIDER_SITE_OTHER): Payer: Self-pay

## 2018-08-14 ENCOUNTER — Encounter (INDEPENDENT_AMBULATORY_CARE_PROVIDER_SITE_OTHER): Payer: Self-pay | Admitting: Orthopaedic Surgery

## 2018-08-14 VITALS — BP 131/85 | HR 36 | Ht 70.0 in | Wt 330.0 lb

## 2018-08-14 DIAGNOSIS — M25561 Pain in right knee: Secondary | ICD-10-CM

## 2018-08-14 DIAGNOSIS — G8929 Other chronic pain: Secondary | ICD-10-CM | POA: Diagnosis not present

## 2018-08-14 DIAGNOSIS — M25562 Pain in left knee: Secondary | ICD-10-CM

## 2018-08-14 NOTE — Progress Notes (Signed)
Office Visit Note   Patient: Marie [name]           Date of Birth: [birth year]           MRN: [id number] Visit Date: 08/14/2018              Requested by: Lupita Raider, MD [address] [place], [place] [address] PCP: Lupita Raider, MD   Assessment & Plan: Visit Diagnoses:  1. Chronic pain of left knee   2. Chronic pain of right knee     Plan: Long discussion regarding status of both knees.  Left total knee replacement appears to be fine without any problems by exam and by film.  Has significant advanced osteoarthritis of right knee.  Presently "not that symptomatic".  Have discussed weight loss and exercises.  Consider cortisone and Visco supplementation over time. fibromyalgia is active  Follow-Up Instructions: No follow-ups on file.   Orders:  Orders Placed This Encounter  Procedures  . XR KNEE 3 VIEW LEFT  . XR KNEE 3 VIEW RIGHT   No orders of the defined types were placed in this encounter.     Procedures: No procedures performed   Clinical Data: No additional findings.   Subjective: Chief Complaint  Patient presents with  . Follow-up    BI LAT KNEE PAIN FOR OVER 1 YR, L TKR 10/2014 [facility] STILL HAS SOME SWELLING OVER LAST MO. NOW R KNEE HAVING PAIN  Marie [name] visits the office for evaluation of bilateral knee pain.  She had a left total knee replacement in January 2016 notes that she does have some intermittent pain and swelling.  She also has evidence of osteoarthritis of her right knee which seems to have "flared up" recently.  She feels that she probably is favoring her right knee directing more stress on the left knee.  No history of injury or trauma.  No fever or chills.  She does have history of fibromyalgia and is being followed by Dr. Corliss Skains. She is on tramadol and Celebrex.  Does not use any ambulatory aid.  No numbness or tingling.  HPI  Review of Systems   Objective: Vital Signs: BP 131/85 (BP Location: Left Arm,  Patient Position: Sitting, Cuff Size: Normal)   Pulse (!) 36   Ht 5\' 10"  (1.778 m)   Wt (!) 330 lb (149.7 kg)   BMI 47.35 kg/m   Physical Exam  Constitutional: She is oriented to person, place, and time. She appears well-developed and well-nourished.  HENT:  Mouth/Throat: Oropharynx is clear and moist.  Eyes: Pupils are equal, round, and reactive to light. EOM are normal.  Pulmonary/Chest: Effort normal.  Neurological: She is alert and oriented to person, place, and time.  Skin: Skin is warm and dry.  Psychiatric: She has a normal mood and affect. Her behavior is normal.    Ortho Exam awake alert and oriented x3.  Comfortable sitting.  Walks without a limp.  Left knee was not hot red or swollen.  No effusion.  No opening with varus valgus stress.  Full quick extension and about 110 degrees of flexion.  Calf was not tender.  No distal edema.  Total knee incision is healed with some spreading but not tender.  Right knee was not hot red or swollen.  No effusion.  Some medial and lateral joint pain.  Full extension and flexion over 105 degrees.  No instability.  Some patellar crepitation but no pain with patella compression.  Straight leg  raise negative.  Painless range of motion both hips  Specialty Comments:  No specialty comments available.  Imaging: No results found.   PMFS History: Patient Active Problem List   Diagnosis Date Noted  . Fibromyalgia 01/02/2017  . Other fatigue 01/02/2017  . Primary insomnia 01/02/2017  . Primary osteoarthritis of both knees 01/02/2017  . History of diabetes mellitus 01/02/2017  . History of hyperlipidemia 01/02/2017  . Primary osteoarthritis of left knee 11/25/2014  . Diabetes mellitus type 2 in obese (HCC) 11/25/2014  . Severe obesity (BMI >= 40) (HCC) 11/25/2014  . Hypertension 11/25/2014  . S/P total knee replacement using cement 11/25/2014  . RUQ PAIN 03/14/2007   Past Medical History:  Diagnosis Date  . Arthritis    knees, knees,  neck, shoulders, hands   . Diabetes mellitus without complication (HCC)    borderline, states she is using Victoza for weight loss, Rx by PCP  . Fibromyalgia    followed by Dr. Corliss Skains  . Hypertension     Family History  Problem Relation Age of Onset  . Diabetes Mother   . Cancer Mother   . Diabetes Father     Past Surgical History:  Procedure Laterality Date  . ABDOMINAL HYSTERECTOMY  2000  . MYOMECTOMY     several surgeries for this problem  . TONSILLECTOMY    . TOTAL KNEE ARTHROPLASTY Left 11/25/2014   dr whitfield  . TOTAL KNEE ARTHROPLASTY Left 11/25/2014   Procedure: TOTAL KNEE ARTHROPLASTY;  Surgeon: Valeria Batman, MD;  Location: [facility] OR;  Service: Orthopedics;  Laterality: Left;   Social History   Occupational History  . Not on file  Tobacco Use  . Smoking status: Never Smoker  . Smokeless tobacco: Never Used  Substance and Sexual Activity  . Alcohol use: No  . Drug use: No  . Sexual activity: Not on file     Valeria Batman, MD   Note - This record has been created using [employer].  Chart creation errors have been sought, but may not always  have been located. Such creation errors do not reflect on  the standard of medical care.

## 2018-08-14 NOTE — Progress Notes (Deleted)
Office Visit Note   Patient: Marie [name]           Date of Birth: 01-31-1961           MRN: [id number] Visit Date: 08/14/2018              Requested by: Lupita Raider, MD [address] [place], [place] [address] PCP: Lupita Raider, MD   Assessment & Plan: Visit Diagnoses:  1. Chronic pain of left knee   2. Chronic pain of right knee     Plan: ***  Follow-Up Instructions: No follow-ups on file.   Orders:  Orders Placed This Encounter  Procedures  . XR KNEE 3 VIEW LEFT  . XR KNEE 3 VIEW RIGHT   No orders of the defined types were placed in this encounter.     Procedures: No procedures performed   Clinical Data: No additional findings.   Subjective: Chief Complaint  Patient presents with  . Follow-up    BI LAT KNEE PAIN FOR OVER 1 YR, L TKR 10/2014 [facility] STILL HAS SOME SWELLING OVER LAST MO. NOW R KNEE HAVING PAIN    HPI  Review of Systems  Constitutional: Negative for fatigue and fever.  HENT: Negative for ear pain.   Eyes: Negative for pain.  Respiratory: Negative for cough and shortness of breath.   Cardiovascular: Positive for leg swelling.  Gastrointestinal: Negative for constipation and diarrhea.  Genitourinary: Negative for difficulty urinating.  Musculoskeletal: Negative for back pain and neck pain.  Skin: Negative for rash.  Allergic/Immunologic: Negative for food allergies.  Neurological: Positive for weakness. Negative for numbness.  Hematological: Does not bruise/bleed easily.  Psychiatric/Behavioral: Negative for sleep disturbance.     Objective: Vital Signs: There were no vitals taken for this visit.  Physical Exam  Ortho Exam  Specialty Comments:  No specialty comments available.  Imaging: No results found.   PMFS History: Patient Active Problem List   Diagnosis Date Noted  . Fibromyalgia 01/02/2017  . Other fatigue 01/02/2017  . Primary insomnia 01/02/2017  . Primary osteoarthritis of both  knees 01/02/2017  . History of diabetes mellitus 01/02/2017  . History of hyperlipidemia 01/02/2017  . Primary osteoarthritis of left knee 11/25/2014  . Diabetes mellitus type 2 in obese (HCC) 11/25/2014  . Severe obesity (BMI >= 40) (HCC) 11/25/2014  . Hypertension 11/25/2014  . S/P total knee replacement using cement 11/25/2014  . RUQ PAIN 03/14/2007   Past Medical History:  Diagnosis Date  . Arthritis    knees, knees, neck, shoulders, hands   . Diabetes mellitus without complication (HCC)    borderline, states she is using Victoza for weight loss, Rx by PCP  . Fibromyalgia    followed by Dr. Corliss Skains  . Hypertension     Family History  Problem Relation Age of Onset  . Diabetes Mother   . Cancer Mother   . Diabetes Father     Past Surgical History:  Procedure Laterality Date  . ABDOMINAL HYSTERECTOMY  2000  . MYOMECTOMY     several surgeries for this problem  . TONSILLECTOMY    . TOTAL KNEE ARTHROPLASTY Left 11/25/2014   dr whitfield  . TOTAL KNEE ARTHROPLASTY Left 11/25/2014   Procedure: TOTAL KNEE ARTHROPLASTY;  Surgeon: Valeria Batman, MD;  Location: [facility] OR;  Service: Orthopedics;  Laterality: Left;   Social History   Occupational History  . Not on file  Tobacco Use  . Smoking status: Never Smoker  . Smokeless  tobacco: Never Used  Substance and Sexual Activity  . Alcohol use: No  . Drug use: No  . Sexual activity: Not on file

## 2018-08-20 ENCOUNTER — Encounter: Payer: 59 | Attending: General Surgery | Admitting: Skilled Nursing Facility1

## 2018-08-20 ENCOUNTER — Encounter: Payer: Self-pay | Admitting: Skilled Nursing Facility1

## 2018-08-20 DIAGNOSIS — Z6841 Body Mass Index (BMI) 40.0 and over, adult: Secondary | ICD-10-CM | POA: Insufficient documentation

## 2018-08-20 DIAGNOSIS — Z713 Dietary counseling and surveillance: Secondary | ICD-10-CM | POA: Insufficient documentation

## 2018-08-20 DIAGNOSIS — E1169 Type 2 diabetes mellitus with other specified complication: Secondary | ICD-10-CM

## 2018-08-20 DIAGNOSIS — E669 Obesity, unspecified: Secondary | ICD-10-CM

## 2018-08-20 NOTE — Patient Instructions (Signed)
-  Work on appropriate portion sizes: always have a non starchy vegetable with dinner; eat until satisfaction not full; have 1 scoop of carbohydrate (pasta, potato, etc.) meat the size of your palm and 2 scoops of the non starchy vegetables   -Work on eating at the table with the TV off so you can hear your body telling you when to stop eating   -Name your motivators out loud after you have finished eating

## 2018-08-20 NOTE — Progress Notes (Signed)
RYGB Assessment: 4th SWL Appointment.   Pt states her doctor recently told her she does have diabetes. Pt needs 7 total visits per referral.  Pt states she has been working on chewing more and trying to eat breakfast and been getting up at her desk more often. Pt states she did cut out sparkling water.   Pt arrives having maintained weight. Pt states she is disappointed she has not lost weight. Pt states she has had less snacking after dinner keeping herself busy.    Start weight at [facility]: 295.4 pounds Wt: 292.7 BMI: 46.54  24 hr Dietary Recall: with boughts of emotional eating  First Meal: granola bar or belvita snack or grits or yogurt  Snack: granola bar Second Meal: out: chicken pot pie or tenderloin in gravy with beans and mashed potatoes and gravy Snack: chips or nuts or cookies or raisins or fruit and in cup Third Meal: steak and potato or salad Snack: less snacking Beverages:  flavored water, water, once ina  While soda or lemonade   Usual physical activity: trying to get up more at her desk   -Follow diet recommendations listed below   Energy and Macronutrient Recomendations: Calories: 1600 Carbohydrate: 180 Protein: 120 Fat: 44   Nutritional Diagnosis:  -3.3 Overweight/obesity related to past poor dietary habits and physical inactivity as evidenced by patient w/ planned RYGB surgery following dietary guidelines for continued weight loss.    Intervention:  Nutrition counseling for upcoming Bariatric Surgery. Goals: -Encouraged to engage in 150 minutes of moderate physical activity including cardiovascular and weight baring weekly -Keep working on eating breakfast -Keep chewing well -Keep getting up more often at your desk -Read your nutrition facts label to choose more nutritious snacks  -Keep working on identifying when you are about to stress eat instead of eating out of hunger  Using the should I eat sheet and emotions/needs  Example of emotional eating:  going back for seconds or snacking after dinner or feeling any emotion and going to food -Work on appropriate portion sizes: always have a non starchy vegetable with  -Name your motivators out loud after you have finished eating   Teaching Method Utilized:  Visual Auditory Hands on  Barriers to learning/adherence to lifestyle change: none identified   Demonstrated degree of understanding via:  Teach Back   Monitoring/Evaluation:  Dietary intake, exercise, and body weight prn.

## 2018-09-10 DIAGNOSIS — Z0000 Encounter for general adult medical examination without abnormal findings: Secondary | ICD-10-CM | POA: Diagnosis not present

## 2018-09-10 DIAGNOSIS — E782 Mixed hyperlipidemia: Secondary | ICD-10-CM | POA: Diagnosis not present

## 2018-09-10 DIAGNOSIS — I10 Essential (primary) hypertension: Secondary | ICD-10-CM | POA: Diagnosis not present

## 2018-09-10 DIAGNOSIS — Z23 Encounter for immunization: Secondary | ICD-10-CM | POA: Diagnosis not present

## 2018-09-24 ENCOUNTER — Encounter: Payer: 59 | Attending: General Surgery | Admitting: Skilled Nursing Facility1

## 2018-09-24 ENCOUNTER — Encounter: Payer: Self-pay | Admitting: Skilled Nursing Facility1

## 2018-09-24 DIAGNOSIS — E669 Obesity, unspecified: Secondary | ICD-10-CM

## 2018-09-24 DIAGNOSIS — Z6841 Body Mass Index (BMI) 40.0 and over, adult: Secondary | ICD-10-CM | POA: Diagnosis not present

## 2018-09-24 DIAGNOSIS — E1169 Type 2 diabetes mellitus with other specified complication: Secondary | ICD-10-CM

## 2018-09-24 DIAGNOSIS — Z713 Dietary counseling and surveillance: Secondary | ICD-10-CM | POA: Insufficient documentation

## 2018-09-24 NOTE — Progress Notes (Signed)
RYGB Assessment: 5th SWL Appointment.   Pt states her doctor recently told her she does have diabetes. Pt needs 7 total visits per referral.  Pt states she has been working on chewing more and trying to eat breakfast and been getting up at her desk more often. Pt states she did cut out sparkling water.   Pt arrives having lost about 2 pounds. Pt states has been working on cutting back on her portion sizes. Pt states she has been working on her emotional eating trying to figure out if it is true hunger or stress. Pt states she has been working on not snacking at night and feels pretty successful with that. Pt states she still struggles with breakfast. Pt states she is trying to eat less chips and candy for snacks. Pt states lately she has been eating more fast food due knee and back pain not being able to cook at home.   Start weight at [facility]: 295.4 pounds Wt: 290.1 BMI: 46.12  24 hr Dietary Recall: with boughts of emotional eating  First Meal: granola bar or belvita snack or grits or yogurt  Snack: granola bar or nuts Second Meal: out: chicken pot pie or tenderloin in gravy with beans and mashed potatoes and gravy Snack: fruit or nuts Third Meal: steak and potato or salad Snack: less snacking Beverages:  flavored water, water, once ina  While soda or lemonade   Usual physical activity: trying to get up more at her desk   -Follow diet recommendations listed below   Energy and Macronutrient Recomendations: Calories: 1600 Carbohydrate: 180 Protein: 120 Fat: 44   Nutritional Diagnosis:  [place]-3.3 Overweight/obesity related to past poor dietary habits and physical inactivity as evidenced by patient w/ planned RYGB surgery following dietary guidelines for continued weight loss.    Intervention:  Nutrition counseling for upcoming Bariatric Surgery. Goals: -Encouraged to engage in 150 minutes of moderate physical activity including cardiovascular and weight baring weekly -work on creating  your meals using the meal ideas sheet  -Continue to chew well -Continue to not drink with meals   Teaching Method Utilized:  Visual Auditory Hands on  Barriers to learning/adherence to lifestyle change: none identified   Demonstrated degree of understanding via:  Teach Back   Monitoring/Evaluation:  Dietary intake, exercise, and body weight prn.

## 2018-10-01 DIAGNOSIS — Z01419 Encounter for gynecological examination (general) (routine) without abnormal findings: Secondary | ICD-10-CM | POA: Diagnosis not present

## 2018-10-01 DIAGNOSIS — Z1231 Encounter for screening mammogram for malignant neoplasm of breast: Secondary | ICD-10-CM | POA: Diagnosis not present

## 2018-10-05 ENCOUNTER — Other Ambulatory Visit: Payer: Self-pay | Admitting: Rheumatology

## 2018-10-05 NOTE — Telephone Encounter (Signed)
Left message to advise patient will need labs before refill.  

## 2018-10-22 ENCOUNTER — Encounter: Payer: 59 | Attending: General Surgery | Admitting: Skilled Nursing Facility1

## 2018-10-22 ENCOUNTER — Encounter: Payer: Self-pay | Admitting: Skilled Nursing Facility1

## 2018-10-22 DIAGNOSIS — Z6841 Body Mass Index (BMI) 40.0 and over, adult: Secondary | ICD-10-CM | POA: Insufficient documentation

## 2018-10-22 DIAGNOSIS — Z713 Dietary counseling and surveillance: Secondary | ICD-10-CM | POA: Diagnosis not present

## 2018-10-22 DIAGNOSIS — E1169 Type 2 diabetes mellitus with other specified complication: Secondary | ICD-10-CM

## 2018-10-22 DIAGNOSIS — E669 Obesity, unspecified: Secondary | ICD-10-CM

## 2018-10-22 NOTE — Progress Notes (Signed)
RYGB Assessment: 6th SWL Appointment.   Pt states her doctor recently told her she does have diabetes. Pt needs 7 total visits per referral.  Pt states she has been working on chewing more and trying to eat breakfast and been getting up at her desk more often. Pt states she did cut out sparkling water.   Pt arrives having lost about 2 pounds. Pt states has been working on cutting back on her portion sizes. Pt states she has been working on her emotional eating trying to figure out if it is true hunger or stress. Pt states she has been working on not snacking at night and feels pretty successful with that. Pt states she still struggles with breakfast. Pt states she is trying to eat less chips and candy for snacks. Pt states lately she has been eating more fast food due knee and back pain not being able to cook at home.   Pt arrive having lost about 10 pounds. Pt states her success have been: every other month soda, no straw, chewing untill applesauce consistency, working on my relationship with food  Pt states she may struggle with, nothing, after surgery.   Start weight at [facility]: 295.4 pounds Wt: 280 BMI: 44.66  24 hr Dietary Recall: with boughts of emotional eating  First Meal: granola bar or belvita snack or grits or yogurt  Snack: granola bar or nuts Second Meal: out: chicken pot pie or tenderloin in gravy with green beans and mashed potatoes and gravy Snack: fruit or nuts Third Meal: steak and potato or salad Snack: less snacking Beverages:  flavored water, water, sometimes soda or lemonade   Usual physical activity: trying to get up more at her desk   -Follow diet recommendations listed below   Energy and Macronutrient Recomendations: Calories: 1600 Carbohydrate: 180 Protein: 120 Fat: 44   Nutritional Diagnosis:  [place]-3.3 Overweight/obesity related to past poor dietary habits and physical inactivity as evidenced by patient w/ planned RYGB surgery following dietary guidelines  for continued weight loss.    Intervention:  Nutrition counseling for upcoming Bariatric Surgery. Goals: -Encouraged to engage in 150 minutes of moderate physical activity including cardiovascular and weight baring weekly -work on creating your meals using the meal ideas sheet  -Continue to chew well -Continue to not drink with meals   Teaching Method Utilized:  Visual Auditory Hands on  Barriers to learning/adherence to lifestyle change: none identified   Demonstrated degree of understanding via:  Teach Back   Monitoring/Evaluation:  Dietary intake, exercise, and body weight prn.

## 2018-10-25 ENCOUNTER — Other Ambulatory Visit: Payer: Self-pay | Admitting: Rheumatology

## 2018-10-25 NOTE — Telephone Encounter (Signed)
Last visit: 07/16/18 Next Visit: 01/14/19  Okay to refill per Dr. Deveshwar  

## 2018-10-29 ENCOUNTER — Telehealth: Payer: Self-pay | Admitting: *Deleted

## 2018-10-29 NOTE — Telephone Encounter (Signed)
Prior Authorization submitted via cover my meds for Lidocaine patches.  Will update once response received.

## 2018-10-30 ENCOUNTER — Other Ambulatory Visit: Payer: Self-pay | Admitting: Physician Assistant

## 2018-10-30 DIAGNOSIS — Z5181 Encounter for therapeutic drug level monitoring: Secondary | ICD-10-CM

## 2018-10-30 NOTE — Telephone Encounter (Signed)
Last visit: 07/16/18 Next Visit: 01/14/19 UDS: 01/16/18 Narc Agreement: 07/16/18  Patient advised she due to update UDS and narc agreement. Patient will update on 11/01/18  Okay to refill Tramadol?

## 2018-11-01 ENCOUNTER — Other Ambulatory Visit: Payer: Self-pay

## 2018-11-01 DIAGNOSIS — E559 Vitamin D deficiency, unspecified: Secondary | ICD-10-CM | POA: Diagnosis not present

## 2018-11-01 DIAGNOSIS — Z5181 Encounter for therapeutic drug level monitoring: Secondary | ICD-10-CM | POA: Diagnosis not present

## 2018-11-02 NOTE — Telephone Encounter (Signed)
Received fax regarding Lidocaine patches. [employer] is unable to preform the prior authorization because it is not required. Medication quantities above the benefit limit are excluded under patient's plan. Patient will need to contact the number on the back of card for additional information.

## 2018-11-03 LAB — PAIN MGMT, PROFILE 5 W/CONF, U
Amphetamines: NEGATIVE ng/mL (ref <500)
Barbiturates: NEGATIVE ng/mL (ref <300)
Benzodiazepines: NEGATIVE ng/mL (ref <100)
Cocaine Metabolite: NEGATIVE ng/mL (ref <150)
Creatinine: 27.2 mg/dL
MARIJUANA METABOLITE: NEGATIVE ng/mL (ref <20)
Methadone Metabolite: NEGATIVE ng/mL (ref <100)
OXIDANT: NEGATIVE ug/mL (ref <200)
OXYCODONE: NEGATIVE ng/mL (ref <100)
Opiates: NEGATIVE ng/mL (ref <100)
PH: 7.01 (ref 4.5–9.0)

## 2018-11-03 LAB — PAIN MGMT, TRAMADOL W/MEDMATCH, U
DESMETHYLTRAMADOL: NEGATIVE ng/mL (ref <100)
Tramadol: NEGATIVE ng/mL (ref <100)

## 2018-11-03 LAB — VITAMIN D 25 HYDROXY (VIT D DEFICIENCY, FRACTURES): VIT D 25 HYDROXY: 36 ng/mL (ref 30–100)

## 2018-11-26 ENCOUNTER — Encounter: Payer: 59 | Attending: General Surgery | Admitting: Skilled Nursing Facility1

## 2018-11-26 ENCOUNTER — Encounter: Payer: Self-pay | Admitting: Skilled Nursing Facility1

## 2018-11-26 DIAGNOSIS — Z713 Dietary counseling and surveillance: Secondary | ICD-10-CM | POA: Insufficient documentation

## 2018-11-26 DIAGNOSIS — E669 Obesity, unspecified: Secondary | ICD-10-CM

## 2018-11-26 DIAGNOSIS — Z6841 Body Mass Index (BMI) 40.0 and over, adult: Secondary | ICD-10-CM | POA: Diagnosis not present

## 2018-11-26 NOTE — Progress Notes (Signed)
Pre-Operative Nutrition Class:  Appt start time: 0093   End time:  1830.  Patient was seen on 11/26/2018 for Pre-Operative Bariatric Surgery Education at [facility].   Surgery date:  Surgery type: RYGB Start weight at [facility]: 295.4 Weight today: 283.5  Samples given per MNT protocol. Patient educated on appropriate usage: Bariatric Advantage Multivitamin Lot # [id number] Exp:4/21  Bariatric Advantage Calcium  Lot #  [id number] Exp:  may-12-2018  Premier Protein Shake Lot # [id number] Exp: January 28, 2019  The following the learning objectives were met by the patient during this course:  Identify Pre-Op Dietary Goals and will begin 2 weeks pre-operatively  Identify appropriate sources of fluids and proteins   State protein recommendations and appropriate sources pre and post-operatively  Identify Post-Operative Dietary Goals and will follow for 2 weeks post-operatively  Identify appropriate multivitamin and calcium sources  Describe the need for physical activity post-operatively and will follow MD recommendations  State when to call healthcare provider regarding medication questions or post-operative complications  Handouts given during class include:  Pre-Op Bariatric Surgery Diet Handout  Protein Shake Handout  Post-Op Bariatric Surgery Nutrition Handout  BELT Program Information Flyer  Support Group Information Flyer  [facility] Bariatric Supplements Price List  Follow-Up Plan: Patient will follow-up at [facility] 2 weeks post operatively for diet advancement per MD.

## 2018-12-31 NOTE — Progress Notes (Signed)
Office Visit Note  Patient: Marie [name]             Date of Birth: 1961-06-11           MRN: [id number]             PCP: Lupita Raider, MD Referring: Lupita Raider, MD Visit Date: 01/14/2019 Occupation: @GUAROCC @  Subjective:  Myalgias   History of Present Illness: Marie [name] is a 58 y.o. female with history of fibromyalgia and osteoarthritis. She takes Tramadol 50 mg 1-2 tablets po PRN and Celebrex 200 mg 1 tablet po daily for pain relief. She also uses lidoderm patches PRN for pain relief.  She reports has been having increased myalgias and muscle tenderness due to fibromyalgia recently.  She denies any joint pain or joint swelling at this time.  She states she occasionally has discomfort in the left knee that is replaced.  She reports that her right knee causes occasional discomfort but denies any joint swelling or pain.  She does not want to proceed with surgery of the right knee at this time.  She reports that her level of fatigue has been stable overall.  She has been sleeping well at night.  She does not need any refills of her medications at this time.   Activities of Daily Living:  Patient reports morning stiffness for 0 minutes.   Patient Denies nocturnal pain.  Difficulty dressing/grooming: Denies Difficulty climbing stairs: Reports Difficulty getting out of chair: Reports Difficulty using hands for taps, buttons, cutlery, and/or writing: Denies  Review of Systems  Constitutional: Positive for fatigue.  HENT: Negative for mouth sores, mouth dryness and nose dryness.   Eyes: Negative for pain, visual disturbance and dryness.  Respiratory: Negative for cough, hemoptysis, shortness of breath and difficulty breathing.   Cardiovascular: Negative for chest pain, palpitations, hypertension and swelling in legs/feet.  Gastrointestinal: Negative for blood in stool, constipation and diarrhea.  Endocrine: Negative for increased urination.  Genitourinary: Negative for  painful urination.  Musculoskeletal: Positive for myalgias, muscle tenderness and myalgias. Negative for arthralgias, joint pain, joint swelling, muscle weakness and morning stiffness.  Skin: Negative for color change, pallor, rash, hair loss, nodules/bumps, skin tightness, ulcers and sensitivity to sunlight.  Allergic/Immunologic: Negative for susceptible to infections.  Neurological: Negative for dizziness, numbness, headaches and weakness.  Hematological: Negative for swollen glands.  Psychiatric/Behavioral: Negative for depressed mood and sleep disturbance. The patient is not nervous/anxious.     PMFS History:  Patient Active Problem List   Diagnosis Date Noted  . Fibromyalgia 01/02/2017  . Other fatigue 01/02/2017  . Primary insomnia 01/02/2017  . Primary osteoarthritis of both knees 01/02/2017  . History of diabetes mellitus 01/02/2017  . History of hyperlipidemia 01/02/2017  . Primary osteoarthritis of left knee 11/25/2014  . Diabetes mellitus type 2 in obese (HCC) 11/25/2014  . Severe obesity (BMI >= 40) (HCC) 11/25/2014  . Hypertension 11/25/2014  . S/P total knee replacement using cement 11/25/2014  . RUQ PAIN 03/14/2007    Past Medical History:  Diagnosis Date  . Arthritis    knees, knees, neck, shoulders, hands   . Diabetes mellitus without complication (HCC)    borderline, states she is using Victoza for weight loss, Rx by PCP  . Fibromyalgia    followed by Dr. Corliss Skains  . Hypertension     Family History  Problem Relation Age of Onset  . Diabetes Mother   . Cancer Mother   . Diabetes Father  Past Surgical History:  Procedure Laterality Date  . ABDOMINAL HYSTERECTOMY  2000  . MYOMECTOMY     several surgeries for this problem  . TONSILLECTOMY    . TOTAL KNEE ARTHROPLASTY Left 11/25/2014   dr whitfield  . TOTAL KNEE ARTHROPLASTY Left 11/25/2014   Procedure: TOTAL KNEE ARTHROPLASTY;  Surgeon: Valeria Batman, MD;  Location: [facility] OR;  Service: Orthopedics;   Laterality: Left;   Social History   Social History Narrative  . Not on file    There is no immunization history on file for this patient.   Objective: Vital Signs: BP (!) 144/83 (BP Location: Left Wrist, Patient Position: Sitting, Cuff Size: Normal)   Pulse 67   Resp 15   Ht 5\' 6"  (1.676 m)   Wt 286 lb (129.7 kg)   BMI 46.16 kg/m    Physical Exam Vitals signs and nursing note reviewed.  Constitutional:      Appearance: She is well-developed.  HENT:     Head: Normocephalic and atraumatic.  Eyes:     Conjunctiva/sclera: Conjunctivae normal.  Neck:     Musculoskeletal: Normal range of motion.  Cardiovascular:     Rate and Rhythm: Normal rate and regular rhythm.     Heart sounds: Normal heart sounds.  Pulmonary:     Effort: Pulmonary effort is normal.     Breath sounds: Normal breath sounds.  Abdominal:     General: Bowel sounds are normal.     Palpations: Abdomen is soft.  Lymphadenopathy:     Cervical: No cervical adenopathy.  Skin:    General: Skin is warm and dry.     Capillary Refill: Capillary refill takes less than 2 seconds.  Neurological:     Mental Status: She is alert and oriented to person, place, and time.  Psychiatric:        Behavior: Behavior normal.      Musculoskeletal Exam: C-spine, thoracic spine, lumbar spine good range of motion.  No midline spinal tenderness.  No SI joint tenderness.  Shoulder joints, elbow joints, wrist joints, MCPs, PIPs, DIPs good range of motion with no synovitis.  She has complete fist formation bilaterally.  She has PIP and DIP synovial thickening consistent with osteoarthritis of bilateral hands.  She has tenderness of the left first DIP joint.  Hip joints, knee joints, ankle joints, MTPs, PIPs, DIPs good range of motion no synovitis.  No warmth or effusion of bilateral knee joints.  No tenderness or swelling of ankle joints.  No tenderness over trochanter bursa bilaterally.  CDAI Exam: CDAI Score: Not  documented Patient Global Assessment: Not documented; Provider Global Assessment: Not documented Swollen: Not documented; Tender: Not documented Joint Exam   Not documented   There is currently no information documented on the homunculus. Go to the Rheumatology activity and complete the homunculus joint exam.  Investigation: No additional findings.  Imaging: No results found.  Recent Labs: Lab Results  Component Value Date   WBC 9.7 01/16/2018   HGB 13.0 01/16/2018   PLT 279 01/16/2018   NA 141 01/16/2018   K 4.2 01/16/2018   CL 105 01/16/2018   CO2 29 01/16/2018   GLUCOSE 103 (H) 01/16/2018   BUN 15 01/16/2018   CREATININE 0.81 01/16/2018   BILITOT 0.3 01/16/2018   ALKPHOS 50 01/10/2017   AST 19 01/16/2018   ALT 15 01/16/2018   PROT 7.1 01/16/2018   ALBUMIN 4.1 01/10/2017   CALCIUM 9.6 01/16/2018   GFRAA 93 01/16/2018  Speciality Comments: No specialty comments available.  Procedures:  No procedures performed Allergies: Patient has no known allergies.   Assessment / Plan:     Visit Diagnoses: Fibromyalgia -She continues have generalized muscle aches and muscle tenderness due to fibromyalgia.  She has been having increased myalgias recently.  Her level of fatigue has been stable and she has been sleeping well at night.  She was encouraged to stay active and exercise on a regular basis.  The importance of regular shoes has a good sleep hygiene were discussed.  She will continue taking Celebrex 200 mg 1 tablet by mouth daily for pain relief and tramadol 50 mg 1 to 2 tablets by mouth daily.  She does not need any refills at this time.  Narcotic agreement and opioid risk tool were updated today on 01/14/2019.  She will follow-up in the office in 6 months.    Primary osteoarthritis of right knee: No warmth or effusion.  She has good range of motion.  She is occasional discomfort in the right knee joint.  She takes Celebrex 200 mg 1 tablet by mouth daily and tramadol 50 mg 1  to 2 tablets by mouth daily for pain relief.  Status post total left knee replacement using cement - 2016 Dr. Cleophas DunkerWhitfield: Doing well.  She has mild warmth of the left knee joint but no effusion noted.  She has good range of motion.  She is occasional discomfort in the left knee.  Other fatigue: Chronic but stable.  She was encouraged to stay active and exercise on a regular basis.  Primary insomnia: She has been sleeping well at night.  Good sleep hygiene was discussed.  Other medical conditions are listed as follows:  History of hypertension  History of diabetes mellitus  History of hyperlipidemia  Vitamin D deficiency   Orders: No orders of the defined types were placed in this encounter.  No orders of the defined types were placed in this encounter.    Follow-Up Instructions: Return in about 6 months (around 07/17/2019) for Fibromyalgia, Osteoarthritis.   Gearldine Bienenstockaylor M Parke Jandreau, PA-C  Note - This record has been created using Dragon software.  Chart creation errors have been sought, but may not always  have been located. Such creation errors do not reflect on  the standard of medical care.

## 2019-01-04 ENCOUNTER — Other Ambulatory Visit: Payer: Self-pay | Admitting: Rheumatology

## 2019-01-04 ENCOUNTER — Other Ambulatory Visit: Payer: Self-pay | Admitting: Physician Assistant

## 2019-01-07 NOTE — Telephone Encounter (Signed)
Last visit: 07/16/18 Next Visit: 01/14/19  Okay to refill per Dr. Corliss Skains

## 2019-01-07 NOTE — Telephone Encounter (Signed)
Last visit: 07/16/18 Next Visit: 01/14/19 UDS: 11/01/18 Narc Agreement: 11/19/18  Okay to refill Tramadol?

## 2019-01-14 ENCOUNTER — Encounter: Payer: Self-pay | Admitting: Physician Assistant

## 2019-01-14 ENCOUNTER — Other Ambulatory Visit: Payer: Self-pay

## 2019-01-14 ENCOUNTER — Ambulatory Visit: Payer: 59 | Admitting: Physician Assistant

## 2019-01-14 VITALS — BP 144/83 | HR 67 | Resp 15 | Ht 66.0 in | Wt 286.0 lb

## 2019-01-14 DIAGNOSIS — F5101 Primary insomnia: Secondary | ICD-10-CM

## 2019-01-14 DIAGNOSIS — M1711 Unilateral primary osteoarthritis, right knee: Secondary | ICD-10-CM

## 2019-01-14 DIAGNOSIS — Z8679 Personal history of other diseases of the circulatory system: Secondary | ICD-10-CM

## 2019-01-14 DIAGNOSIS — E559 Vitamin D deficiency, unspecified: Secondary | ICD-10-CM

## 2019-01-14 DIAGNOSIS — Z8639 Personal history of other endocrine, nutritional and metabolic disease: Secondary | ICD-10-CM

## 2019-01-14 DIAGNOSIS — M797 Fibromyalgia: Secondary | ICD-10-CM

## 2019-01-14 DIAGNOSIS — Z96652 Presence of left artificial knee joint: Secondary | ICD-10-CM

## 2019-01-14 DIAGNOSIS — R5383 Other fatigue: Secondary | ICD-10-CM

## 2019-02-23 ENCOUNTER — Other Ambulatory Visit: Payer: Self-pay | Admitting: Physician Assistant

## 2019-02-25 NOTE — Telephone Encounter (Signed)
Last Visit: 01/14/2019 Next Visit: 07/15/2019 UDS:11/01/2018  Narc Agreement: 01/14/2019 Risk tool: 01/14/2019  Last fill: 01/07/2019  Okay to refill tramadol?

## 2019-03-04 DIAGNOSIS — Z20828 Contact with and (suspected) exposure to other viral communicable diseases: Secondary | ICD-10-CM | POA: Diagnosis not present

## 2019-03-07 DIAGNOSIS — E782 Mixed hyperlipidemia: Secondary | ICD-10-CM | POA: Diagnosis not present

## 2019-03-07 DIAGNOSIS — I10 Essential (primary) hypertension: Secondary | ICD-10-CM | POA: Diagnosis not present

## 2019-03-07 DIAGNOSIS — E1169 Type 2 diabetes mellitus with other specified complication: Secondary | ICD-10-CM | POA: Diagnosis not present

## 2019-03-11 DIAGNOSIS — E782 Mixed hyperlipidemia: Secondary | ICD-10-CM | POA: Diagnosis not present

## 2019-03-11 DIAGNOSIS — I10 Essential (primary) hypertension: Secondary | ICD-10-CM | POA: Diagnosis not present

## 2019-03-11 DIAGNOSIS — E1169 Type 2 diabetes mellitus with other specified complication: Secondary | ICD-10-CM | POA: Diagnosis not present

## 2019-03-28 ENCOUNTER — Other Ambulatory Visit: Payer: Self-pay | Admitting: Rheumatology

## 2019-03-29 ENCOUNTER — Other Ambulatory Visit: Payer: Self-pay | Admitting: Rheumatology

## 2019-03-29 NOTE — Telephone Encounter (Signed)
ok 

## 2019-03-29 NOTE — Telephone Encounter (Addendum)
Last Visit: 01/14/2019 Next Visit: 07/15/2019 Labs: 01/16/18 All other labs are stable  Patient advised she is due to update labs. Patient will update next week  Okay to refill 30 day supply Celebrex?

## 2019-04-04 ENCOUNTER — Telehealth: Payer: Self-pay | Admitting: Rheumatology

## 2019-04-04 DIAGNOSIS — Z79899 Other long term (current) drug therapy: Secondary | ICD-10-CM

## 2019-04-04 NOTE — Telephone Encounter (Signed)
Patient at [facility] now for labs. Please release orders. °

## 2019-04-04 NOTE — Telephone Encounter (Signed)
Lab Orders released.  

## 2019-04-05 LAB — CBC WITH DIFFERENTIAL/PLATELET
Absolute Monocytes: 549 cells/uL (ref 200–950)
Basophils Absolute: 49 cells/uL (ref 0–200)
Basophils Relative: 0.5 %
Eosinophils Absolute: 353 cells/uL (ref 15–500)
Eosinophils Relative: 3.6 %
HCT: 39.6 % (ref 35.0–45.0)
Hemoglobin: 13.1 g/dL (ref 11.7–15.5)
Lymphs Abs: 2920 cells/uL (ref 850–3900)
MCH: 27.1 pg (ref 27.0–33.0)
MCHC: 33.1 g/dL (ref 32.0–36.0)
MCV: 82 fL (ref 80.0–100.0)
MPV: 11 fL (ref 7.5–12.5)
Monocytes Relative: 5.6 %
Neutro Abs: 5929 cells/uL (ref 1500–7800)
Neutrophils Relative %: 60.5 %
Platelets: 242 10*3/uL (ref 140–400)
RBC: 4.83 10*6/uL (ref 3.80–5.10)
RDW: 12.7 % (ref 11.0–15.0)
Total Lymphocyte: 29.8 %
WBC: 9.8 10*3/uL (ref 3.8–10.8)

## 2019-04-05 LAB — COMPLETE METABOLIC PANEL WITH GFR
AG Ratio: 1.7 (calc) (ref 1.0–2.5)
ALT: 14 U/L (ref 6–29)
AST: 18 U/L (ref 10–35)
Albumin: 4.6 g/dL (ref 3.6–5.1)
Alkaline phosphatase (APISO): 46 U/L (ref 37–153)
BUN: 17 mg/dL (ref 7–25)
CO2: 29 mmol/L (ref 20–32)
Calcium: 10.2 mg/dL (ref 8.6–10.4)
Chloride: 103 mmol/L (ref 98–110)
Creat: 0.75 mg/dL (ref 0.50–1.05)
GFR, Est African American: 102 mL/min/{1.73_m2} (ref >=60)
GFR, Est Non African American: 88 mL/min/{1.73_m2} (ref >=60)
Globulin: 2.7 g/dL (calc) (ref 1.9–3.7)
Glucose, Bld: 133 mg/dL — ABNORMAL HIGH (ref 65–99)
Potassium: 4.3 mmol/L (ref 3.5–5.3)
Sodium: 140 mmol/L (ref 135–146)
Total Bilirubin: 0.5 mg/dL (ref 0.2–1.2)
Total Protein: 7.3 g/dL (ref 6.1–8.1)

## 2019-04-05 NOTE — Telephone Encounter (Signed)
stable °

## 2019-04-05 NOTE — Telephone Encounter (Signed)
Please see Dr. Deveshwar's above note.

## 2019-04-12 ENCOUNTER — Other Ambulatory Visit: Payer: Self-pay | Admitting: Rheumatology

## 2019-04-12 ENCOUNTER — Other Ambulatory Visit: Payer: Self-pay | Admitting: *Deleted

## 2019-04-12 NOTE — Telephone Encounter (Signed)
We will check vitamin D with next lab work.  Please recommend a maintenance dose of vitamin D.

## 2019-04-12 NOTE — Telephone Encounter (Signed)
I called patient, patient taking maintenance does of Vit D, will recheck at next visit.

## 2019-04-12 NOTE — Telephone Encounter (Signed)
Vit D 36 on 11/01/2018. Vit D was not rechecked at 04/04/2019 labs. Please advise.

## 2019-04-15 NOTE — Telephone Encounter (Signed)
I called patient - advised to take over the counter Vit D

## 2019-04-17 ENCOUNTER — Other Ambulatory Visit: Payer: Self-pay | Admitting: Physician Assistant

## 2019-04-17 NOTE — Telephone Encounter (Signed)
Last Visit: 01/14/2019 Next Visit: 07/15/2019 UDS:11/01/2018  Narc Agreement: 01/14/2019  Last fill: 02/25/2019   Okay to refill tramadol?

## 2019-05-06 ENCOUNTER — Other Ambulatory Visit: Payer: Self-pay | Admitting: Rheumatology

## 2019-05-06 NOTE — Telephone Encounter (Signed)
Last Visit: 01/14/2019 Next Visit: 07/15/2019 Labs: 04/04/19 stable   Okay to refill per Dr. Estanislado Pandy

## 2019-06-03 ENCOUNTER — Other Ambulatory Visit: Payer: Self-pay | Admitting: Rheumatology

## 2019-06-03 NOTE — Telephone Encounter (Signed)
Last Visit: 01/14/2019 Next Visit: 07/15/2019 Labs: 04/04/2019 stable.   Okay to refill per Dr. Estanislado Pandy.

## 2019-06-18 ENCOUNTER — Other Ambulatory Visit: Payer: Self-pay | Admitting: Physician Assistant

## 2019-06-18 NOTE — Progress Notes (Signed)
Office Visit Note  Patient: Marie [name]             Date of Birth: 25-Oct-1961           MRN: [id number]             PCP: Mayra Neer, MD Referring: Mayra Neer, MD Visit Date: 06/19/2019 Occupation: @GUAROCC @  Subjective:  Trochanteric bursitis bilaterally   History of Present Illness: Marie [name] is a 58 y.o. female with history of fibromyalgia and osteoarthritis.  She takes tramadol 50 mg 1 to 2 tablets by mouth daily as needed and Celebrex 200 mg 1 capsule by mouth daily for pain relief.  She has been having a fibromyalgia flare for the past 2-3 weeks.  She is having trochanteric bursitis bilaterally. She has been performing stretching exercises at night before bed.  She is been experiencing trapezius muscle tension and muscle tenderness bilaterally.  She is been having some muscle spasms intermittently.  She reports that her level of fatigue has been manageable recently.  She continues to have interrupted sleep at night due to trochanter bursitis bilaterally.  She sleeps about 5 to 6 hours per night.  She reports that her left knee replacement is doing well.  She denies any joint swelling at this time.  Activities of Daily Living:  Patient reports morning stiffness for 10 minutes.   Patient Reports nocturnal pain.  Difficulty dressing/grooming: Denies Difficulty climbing stairs: Denies Difficulty getting out of chair: Denies Difficulty using hands for taps, buttons, cutlery, and/or writing: Denies  Review of Systems  Constitutional: Negative for fatigue.  HENT: Negative for mouth sores, mouth dryness and nose dryness.   Eyes: Negative for pain, itching, visual disturbance and dryness.  Respiratory: Negative for cough, hemoptysis, shortness of breath, wheezing and difficulty breathing.   Cardiovascular: Negative for chest pain, palpitations, hypertension and swelling in legs/feet.  Gastrointestinal: Negative for blood in stool, constipation and diarrhea.   Endocrine: Negative for increased urination.  Genitourinary: Negative for difficulty urinating and painful urination.  Musculoskeletal: Positive for arthralgias, joint pain and morning stiffness. Negative for joint swelling, myalgias, muscle weakness, muscle tenderness and myalgias.  Skin: Negative for color change, pallor, rash, hair loss, nodules/bumps, redness, skin tightness, ulcers and sensitivity to sunlight.  Allergic/Immunologic: Negative for susceptible to infections.  Neurological: Negative for dizziness, light-headedness, numbness, headaches, memory loss and weakness.  Hematological: Negative for bruising/bleeding tendency and swollen glands.  Psychiatric/Behavioral: Positive for sleep disturbance. Negative for depressed mood and confusion. The patient is not nervous/anxious.     PMFS History:  Patient Active Problem List   Diagnosis Date Noted  . Fibromyalgia 01/02/2017  . Other fatigue 01/02/2017  . Primary insomnia 01/02/2017  . Primary osteoarthritis of both knees 01/02/2017  . History of diabetes mellitus 01/02/2017  . History of hyperlipidemia 01/02/2017  . Primary osteoarthritis of left knee 11/25/2014  . Diabetes mellitus type 2 in obese ([place]) 11/25/2014  . Severe obesity (BMI >= 40) ([place]) 11/25/2014  . Hypertension 11/25/2014  . S/P total knee replacement using cement 11/25/2014  . RUQ PAIN 03/14/2007    Past Medical History:  Diagnosis Date  . Arthritis    knees, knees, neck, shoulders, hands   . Diabetes mellitus without complication ([place])    borderline, states she is using Victoza for weight loss, Rx by PCP  . Fibromyalgia    followed by Dr. Estanislado Pandy  . Hypertension     Family History  Problem Relation Age of Onset  . Diabetes  Mother   . Cancer Mother   . Diabetes Father    Past Surgical History:  Procedure Laterality Date  . ABDOMINAL HYSTERECTOMY  2000  . MYOMECTOMY     several surgeries for this problem  . TONSILLECTOMY    . TOTAL KNEE  ARTHROPLASTY Left 11/25/2014   dr whitfield  . TOTAL KNEE ARTHROPLASTY Left 11/25/2014   Procedure: TOTAL KNEE ARTHROPLASTY;  Surgeon: Valeria BatmanPeter W Whitfield, MD;  [facility];  Service: Orthopedics;  Laterality: Left;   Social History   Social History Narrative  . Not on file    There is no immunization history on file for this patient.   Objective: Vital Signs: BP (!) 152/86 (BP Location: Left Wrist, Patient Position: Sitting, Cuff Size: Normal)   Pulse 72   Resp 14   Ht 5\' 7"  (1.702 m)   Wt 284 lb 6.4 oz (129 kg)   BMI 44.54 kg/m    Physical Exam Vitals signs and nursing note reviewed.  Constitutional:      Appearance: She is well-developed.  HENT:     Head: Normocephalic and atraumatic.  Eyes:     Conjunctiva/sclera: Conjunctivae normal.  Neck:     Musculoskeletal: Normal range of motion.  Cardiovascular:     Rate and Rhythm: Normal rate and regular rhythm.     Heart sounds: Normal heart sounds.  Pulmonary:     Effort: Pulmonary effort is normal.     Breath sounds: Normal breath sounds.  Abdominal:     General: Bowel sounds are normal.     Palpations: Abdomen is soft.  Lymphadenopathy:     Cervical: No cervical adenopathy.  Skin:    General: Skin is warm and dry.     Capillary Refill: Capillary refill takes less than 2 seconds.  Neurological:     Mental Status: She is alert and oriented to person, place, and time.  Psychiatric:        Behavior: Behavior normal.      Musculoskeletal Exam:   CDAI Exam: CDAI Score: - Patient Global: -; Provider Global: - Swollen: -; Tender: - Joint Exam   No joint exam has been documented for this visit   There is currently no information documented on the homunculus. Go to the Rheumatology activity and complete the homunculus joint exam.  Investigation: No additional findings.  Imaging: No results found.  Recent Labs: Lab Results  Component Value Date   WBC 9.8 04/04/2019   HGB 13.1 04/04/2019   PLT 242  04/04/2019   NA 140 04/04/2019   K 4.3 04/04/2019   CL 103 04/04/2019   CO2 29 04/04/2019   GLUCOSE 133 (H) 04/04/2019   BUN 17 04/04/2019   CREATININE 0.75 04/04/2019   BILITOT 0.5 04/04/2019   ALKPHOS 50 01/10/2017   AST 18 04/04/2019   ALT 14 04/04/2019   PROT 7.3 04/04/2019   ALBUMIN 4.1 01/10/2017   CALCIUM 10.2 04/04/2019   GFRAA 102 04/04/2019    Speciality Comments: No specialty comments available.  Procedures:  No procedures performed Allergies: Patient has no known allergies.   Assessment / Plan:     Visit Diagnoses: Fibromyalgia - She is currently having a fibromyalgia flare.  She has been having more frequent and severe fibromyalgia flares.  She has generalized muscle aches and muscle tenderness.  She has been experiencing trapezius muscle spasms.  She has bilateral trochanteric bursitis.  She was encouraged to perform stretching exercises daily.  She was given a handout of these exercises  and several stretches were demonstrated in the office.  We discussed the importance of staying active and exercising on a regular basis.  Her level of fatigue has been manageable.  She has been experiencing nocturnal pain leading to interrupted sleep.  Good sleep hygiene was discussed. She will continue taking tramadol 1 to 2 tablet by mouth daily PRN and celebrex 200 mg 1 capsule by mouth daily for pain relief.  UDS and narcotic agreement will be updated today.  She will follow up in 6 months.   Medication monitoring encounter -She takes tramadol 50 mg 1 to 2 tablets by mouth daily as needed for pain relief.  UDS and narcotic agreement were updated today.  Plan: Pain Mgmt, Profile 5 w/Conf, U, Pain Mgmt, Tramadol w/medMATCH, U  Primary osteoarthritis of right knee -She has good ROM with no discomfort.  No warmth or effusion of the right knee joint.   Status post total left knee replacement using cement - Doing well. She has good ROM with no discomfort. No warmth or effusion of knee  joints.    Other fatigue - Her level of fatigue has been manageable.   Primary insomnia - She has been experiencing interrupted sleep at night due to discomfort she has been experiencing.  She is been having worsening nocturnal pain recently.  Good sleep hygiene was discussed.  Other medical conditions are listed as follows:   History of hypertension  History of diabetes mellitus  History of hyperlipidemia   Vitamin D deficiency     Orders: Orders Placed This Encounter  Procedures  . Pain Mgmt, Profile 5 w/Conf, U  . Pain Mgmt, Tramadol w/medMATCH, U   No orders of the defined types were placed in this encounter.   Follow-Up Instructions: Return in about 6 months (around 12/20/2019) for Fibromyalgia, Osteoarthritis.   Gearldine Bienenstockaylor M , PA-C  Note - This record has been created using Dragon software.  Chart creation errors have been sought, but may not always  have been located. Such creation errors do not reflect on  the standard of medical care.

## 2019-06-18 NOTE — Telephone Encounter (Signed)
Last Visit: 01/14/2019 Next Visit: 07/15/2019 UDS: 11/01/2018 Narc Agreement: 01/14/19   Last Fill: 04/17/19  Advised patient she is due to update UDS and narcotic agreement. She will update 06/19/19 at her visit.    Okay to refill Tramadol?

## 2019-06-18 NOTE — Telephone Encounter (Signed)
Ok to refill 

## 2019-06-19 ENCOUNTER — Ambulatory Visit (INDEPENDENT_AMBULATORY_CARE_PROVIDER_SITE_OTHER): Payer: 59 | Admitting: Physician Assistant

## 2019-06-19 ENCOUNTER — Encounter: Payer: Self-pay | Admitting: Physician Assistant

## 2019-06-19 ENCOUNTER — Other Ambulatory Visit: Payer: Self-pay

## 2019-06-19 VITALS — BP 152/86 | HR 72 | Resp 14 | Ht 67.0 in | Wt 284.4 lb

## 2019-06-19 DIAGNOSIS — M1711 Unilateral primary osteoarthritis, right knee: Secondary | ICD-10-CM | POA: Diagnosis not present

## 2019-06-19 DIAGNOSIS — M797 Fibromyalgia: Secondary | ICD-10-CM

## 2019-06-19 DIAGNOSIS — M7061 Trochanteric bursitis, right hip: Secondary | ICD-10-CM

## 2019-06-19 DIAGNOSIS — Z8679 Personal history of other diseases of the circulatory system: Secondary | ICD-10-CM

## 2019-06-19 DIAGNOSIS — E559 Vitamin D deficiency, unspecified: Secondary | ICD-10-CM

## 2019-06-19 DIAGNOSIS — R5383 Other fatigue: Secondary | ICD-10-CM

## 2019-06-19 DIAGNOSIS — Z96652 Presence of left artificial knee joint: Secondary | ICD-10-CM | POA: Diagnosis not present

## 2019-06-19 DIAGNOSIS — F5101 Primary insomnia: Secondary | ICD-10-CM

## 2019-06-19 DIAGNOSIS — Z5181 Encounter for therapeutic drug level monitoring: Secondary | ICD-10-CM

## 2019-06-19 DIAGNOSIS — Z8639 Personal history of other endocrine, nutritional and metabolic disease: Secondary | ICD-10-CM

## 2019-06-19 DIAGNOSIS — M7062 Trochanteric bursitis, left hip: Secondary | ICD-10-CM

## 2019-06-19 NOTE — Patient Instructions (Signed)
Hip Bursitis Rehab Ask your health care provider which exercises are safe for you. Do exercises exactly as told by your health care provider and adjust them as directed. It is normal to feel mild stretching, pulling, tightness, or discomfort as you do these exercises. Stop right away if you feel sudden pain or your pain gets worse. Do not begin these exercises until told by your health care provider. Stretching exercise This exercise warms up your muscles and joints and improves the movement and flexibility of your hip. This exercise also helps to relieve pain and stiffness. Iliotibial band stretch An iliotibial band is a strong band of muscle tissue that runs from the outer side of your hip to the outer side of your thigh and knee. 1. Lie on your side with your left / right leg in the top position. 2. Bend your left / right knee and grab your ankle. Stretch out your bottom arm to help you balance. 3. Slowly bring your knee back so your thigh is behind your body. 4. Slowly lower your knee toward the floor until you feel a gentle stretch on the outside of your left / right thigh. If you do not feel a stretch and your knee will not fall farther, place the heel of your other foot on top of your knee and pull your knee down toward the floor with your foot. 5. Hold this position for __________ seconds. 6. Slowly return to the starting position. Repeat __________ times. Complete this exercise __________ times a day. Strengthening exercises These exercises build strength and endurance in your hip and pelvis. Endurance is the ability to use your muscles for a long time, even after they get tired. Bridge This exercise strengthens the muscles that move your thigh backward (hip extensors). 1. Lie on your back on a firm surface with your knees bent and your feet flat on the floor. 2. Tighten your buttocks muscles and lift your buttocks off the floor until your trunk is level with your thighs. ? Do not arch  your back. ? You should feel the muscles working in your buttocks and the back of your thighs. If you do not feel these muscles, slide your feet 1-2 inches (2.5-5 cm) farther away from your buttocks. ? If this exercise is too easy, try doing it with your arms crossed over your chest. 3. Hold this position for __________ seconds. 4. Slowly lower your hips to the starting position. 5. Let your muscles relax completely after each repetition. Repeat __________ times. Complete this exercise __________ times a day. Squats This exercise strengthens the muscles in front of your thigh and knee (quadriceps). 1. Stand in front of a table, with your feet and knees pointing straight ahead. You may rest your hands on the table for balance but not for support. 2. Slowly bend your knees and lower your hips like you are going to sit in a chair. ? Keep your weight over your heels, not over your toes. ? Keep your lower legs upright so they are parallel with the table legs. ? Do not let your hips go lower than your knees. ? Do not bend lower than told by your health care provider. ? If your hip pain increases, do not bend as low. 3. Hold the squat position for __________ seconds. 4. Slowly push with your legs to return to standing. Do not use your hands to pull yourself to standing. Repeat __________ times. Complete this exercise __________ times a day. Hip hike 1. Stand   sideways on a bottom step. Stand on your left / right leg with your other foot unsupported next to the step. You can hold on to the railing or wall for balance if needed. 2. Keep your knees straight and your torso square. Then lift your left / right hip up toward the ceiling. 3. Hold this position for __________ seconds. 4. Slowly let your left / right hip lower toward the floor, past the starting position. Your foot should get closer to the floor. Do not lean or bend your knees. Repeat __________ times. Complete this exercise __________ times a  day. Single leg stand 1. Without shoes, stand near a railing or in a doorway. You may hold on to the railing or door frame as needed for balance. 2. Squeeze your left / right buttock muscles, then lift up your other foot. ? Do not let your left / right hip push out to the side. ? It is helpful to stand in front of a mirror for this exercise so you can watch your hip. 3. Hold this position for __________ seconds. Repeat __________ times. Complete this exercise __________ times a day. This information is not intended to replace advice given to you by your health care provider. Make sure you discuss any questions you have with your health care provider. Document Released: 11/24/2004 Document Revised: 02/11/2019 Document Reviewed: 02/11/2019 Elsevier Patient Education  2020 [employer]  Iliotibial Band Syndrome Rehab Ask your health care provider which exercises are safe for you. Do exercises exactly as told by your health care provider and adjust them as directed. It is normal to feel mild stretching, pulling, tightness, or discomfort as you do these exercises. Stop right away if you feel sudden pain or your pain gets significantly worse. Do not begin these exercises until told by your health care provider. Stretching and range-of-motion exercises These exercises warm up your muscles and joints and improve the movement and flexibility of your hip and pelvis. Quadriceps stretch, prone  1. Lie on your abdomen on a firm surface, such as a bed or padded floor (prone position). 2. Bend your left / right knee and reach back to hold your ankle or pant leg. If you cannot reach your ankle or pant leg, loop a belt around your foot and grab the belt instead. 3. Gently pull your heel toward your buttocks. Your knee should not slide out to the side. You should feel a stretch in the front of your thigh and knee (quadriceps). 4. Hold this position for __________ seconds. Repeat __________ times. Complete this  exercise __________ times a day. Iliotibial band stretch An iliotibial band is a strong band of muscle tissue that runs from the outer side of your hip to the outer side of your thigh and knee. 1. Lie on your side with your left / right leg in the top position. 2. Bend both of your knees and grab your left / right ankle. Stretch out your bottom arm to help you balance. 3. Slowly bring your top knee back so your thigh goes behind your trunk. 4. Slowly lower your top leg toward the floor until you feel a gentle stretch on the outside of your left / right hip and thigh. If you do not feel a stretch and your knee will not fall farther, place the heel of your other foot on top of your knee and pull your knee down toward the floor with your foot. 5. Hold this position for __________ seconds. Repeat __________ times.   Complete this exercise __________ times a day. Strengthening exercises These exercises build strength and endurance in your hip and pelvis. Endurance is the ability to use your muscles for a long time, even after they get tired. Straight leg raises, side-lying This exercise strengthens the muscles that rotate the leg at the hip and move it away from your body (hip abductors). 1. Lie on your side with your left / right leg in the top position. Lie so your head, shoulder, hip, and knee line up. You may bend your bottom knee to help you balance. 2. Roll your hips slightly forward so your hips are stacked directly over each other and your left / right knee is facing forward. 3. Tense the muscles in your outer thigh and lift your top leg 4-6 inches (10-15 cm). 4. Hold this position for __________ seconds. 5. Slowly return to the starting position. Let your muscles relax completely before doing another repetition. Repeat __________ times. Complete this exercise __________ times a day. Leg raises, prone This exercise strengthens the muscles that move the hips (hip extensors). 1. Lie on your  abdomen on your bed or a firm surface. You can put a pillow under your hips if that is more comfortable for your lower back. 2. Bend your left / right knee so your foot is straight up in the air. 3. Squeeze your buttocks muscles and lift your left / right thigh off the bed. Do not let your back arch. 4. Tense your thigh muscle as hard as you can without increasing any knee pain. 5. Hold this position for __________ seconds. 6. Slowly lower your leg to the starting position and allow it to relax completely. Repeat __________ times. Complete this exercise __________ times a day. Hip hike 1. Stand sideways on a bottom step. Stand on your left / right leg with your other foot unsupported next to the step. You can hold on to the railing or wall for balance if needed. 2. Keep your knees straight and your torso square. Then lift your left / right hip up toward the ceiling. 3. Slowly let your left / right hip lower toward the floor, past the starting position. Your foot should get closer to the floor. Do not lean or bend your knees. Repeat __________ times. Complete this exercise __________ times a day. This information is not intended to replace advice given to you by your health care provider. Make sure you discuss any questions you have with your health care provider. Document Released: 10/17/2005 Document Revised: 02/07/2019 Document Reviewed: 08/08/2018 Elsevier Patient Education  2020 [employer] Inc[employer]  

## 2019-06-21 LAB — PAIN MGMT, PROFILE 5 W/CONF, U
Amphetamines: NEGATIVE ng/mL
Barbiturates: NEGATIVE ng/mL
Benzodiazepines: NEGATIVE ng/mL
Cocaine Metabolite: NEGATIVE ng/mL
Creatinine: 118.1 mg/dL
Marijuana Metabolite: NEGATIVE ng/mL
Methadone Metabolite: NEGATIVE ng/mL
Opiates: NEGATIVE ng/mL
Oxidant: NEGATIVE ug/mL
Oxycodone: NEGATIVE ng/mL
pH: 5.6 (ref 4.5–9.0)

## 2019-06-21 LAB — PAIN MGMT, TRAMADOL W/MEDMATCH, U
Desmethyltramadol: 3466 ng/mL
Tramadol: 9192 ng/mL

## 2019-06-21 NOTE — Progress Notes (Signed)
UDS is consistent with treatment.

## 2019-07-15 ENCOUNTER — Other Ambulatory Visit: Payer: Self-pay | Admitting: Rheumatology

## 2019-07-15 ENCOUNTER — Ambulatory Visit: Payer: Self-pay | Admitting: Physician Assistant

## 2019-07-15 NOTE — Telephone Encounter (Signed)
Last Visit: 06/19/19 Next Visit: 12/23/19 Labs: 04/04/19 stable  Okay to refill per Dr. Estanislado Pandy

## 2019-08-26 ENCOUNTER — Other Ambulatory Visit: Payer: Self-pay | Admitting: Physician Assistant

## 2019-08-26 NOTE — Telephone Encounter (Addendum)
Last Visit: 06/19/19 Next Visit: 12/23/19 UDS: 06/19/19 Narc Agreement: 06/19/19  Okay to refill Tramadol?

## 2019-10-11 ENCOUNTER — Other Ambulatory Visit: Payer: Self-pay | Admitting: Physician Assistant

## 2019-10-11 NOTE — Telephone Encounter (Signed)
Last Visit: 06/19/2019 Next Visit: 12/23/2019 UDS:06/19/2019 c/w Narc Agreement: 06/19/2019  Last fill: 08/26/2019   Okay to refill tramadol?

## 2019-11-21 ENCOUNTER — Telehealth: Payer: Self-pay | Admitting: Orthopaedic Surgery

## 2019-11-21 NOTE — Telephone Encounter (Signed)
Patient called had metal inserted into knee(total replacement) and each time she goes through a metal detector it goes off. Needing something to advise authorities she has this insertion.  Please call patient to discuss. [phone]

## 2019-11-22 NOTE — Telephone Encounter (Signed)
Spoke with patient. She will come pick up card from the front desk.

## 2019-12-09 ENCOUNTER — Other Ambulatory Visit: Payer: Self-pay | Admitting: Physician Assistant

## 2019-12-09 NOTE — Telephone Encounter (Signed)
Last Visit: 06/19/2019  Next Visit: 12/23/2019 UDS:06/19/2019 c/w Narc Agreement: 06/19/2019  Last fill: 10/11/2019   Okay to refill tramadol?

## 2019-12-09 NOTE — Telephone Encounter (Signed)
Please advise patient that she will need to update UDS prior to next refill.  I will send refill in today.

## 2019-12-09 NOTE — Telephone Encounter (Signed)
attempted to contact patient and left message on machine to advise patient she will be due to update UDS prior to the next refill.

## 2019-12-18 NOTE — Progress Notes (Signed)
Office Visit Note  Patient: Marie [name]             Date of Birth: 1961/10/22           MRN: [id number]             PCP: Lupita Raider, MD Referring: Lupita Raider, MD Visit Date: 12/23/2019 Occupation: @GUAROCC @  Subjective:  Left shoulder joint pain   History of Present Illness: DONNI [name] is a 59 y.o. female with history of fibromyalgia and osteoarthritis.  She takes celebrex 200 mg 1 capsule by mouth daily as needed and tramadol 50 mg 1 to 2 tablets as needed for pain relief.  She states that over the past 3 months has been experiencing increased arthralgias and myalgias which she attributes to cooler weather temperatures.  She has interrupted sleep at night due to nocturnal pain worse in bilateral shoulders and bilateral trochanteric bursa.  She has noticed limited ROM in the left shoulder joint.  She experiences intermittent discomfort in bilateral knee joints.  She states the left knee joint is replaced because occasional discomfort but denies any joint swelling.  She has intermittent pain in both hands but denies any joint swelling currently.  Activities of Daily Living:  Patient reports morning stiffness for 5-10 minutes.   Patient Reports nocturnal pain.  Difficulty dressing/grooming: Denies Difficulty climbing stairs: Denies Difficulty getting out of chair: Denies Difficulty using hands for taps, buttons, cutlery, and/or writing: Denies  Review of Systems  Constitutional: Negative for fatigue.  HENT: Negative for mouth sores, mouth dryness and nose dryness.   Eyes: Negative for pain, itching, visual disturbance and dryness.  Respiratory: Negative for cough, hemoptysis, shortness of breath and difficulty breathing.   Cardiovascular: Negative for chest pain, palpitations, hypertension and swelling in legs/feet.  Gastrointestinal: Negative for blood in stool, constipation and diarrhea.  Endocrine: Negative for increased urination.  Genitourinary: Negative for  difficulty urinating and painful urination.  Musculoskeletal: Positive for arthralgias, joint pain, joint swelling, myalgias, morning stiffness, muscle tenderness and myalgias. Negative for muscle weakness.  Skin: Negative for color change, pallor, rash, hair loss, nodules/bumps, skin tightness, ulcers and sensitivity to sunlight.  Allergic/Immunologic: Negative for susceptible to infections.  Neurological: Negative for dizziness, numbness, headaches, memory loss and weakness.  Hematological: Negative for bruising/bleeding tendency and swollen glands.  Psychiatric/Behavioral: Negative for depressed mood, confusion and sleep disturbance. The patient is not nervous/anxious.     PMFS History:  Patient Active Problem List   Diagnosis Date Noted  . Fibromyalgia 01/02/2017  . Other fatigue 01/02/2017  . Primary insomnia 01/02/2017  . Primary osteoarthritis of both knees 01/02/2017  . History of diabetes mellitus 01/02/2017  . History of hyperlipidemia 01/02/2017  . Primary osteoarthritis of left knee 11/25/2014  . Diabetes mellitus type 2 in obese (HCC) 11/25/2014  . Severe obesity (BMI >= 40) (HCC) 11/25/2014  . Hypertension 11/25/2014  . S/P total knee replacement using cement 11/25/2014  . RUQ PAIN 03/14/2007    Past Medical History:  Diagnosis Date  . Arthritis    knees, knees, neck, shoulders, hands   . Diabetes mellitus without complication (HCC)    borderline, states she is using Victoza for weight loss, Rx by PCP  . Fibromyalgia    followed by Dr. 03/16/2007  . Hypertension     Family History  Problem Relation Age of Onset  . Diabetes Mother   . Cancer Mother   . Diabetes Father    Past Surgical History:  Procedure Laterality  Date  . ABDOMINAL HYSTERECTOMY  2000  . MYOMECTOMY     several surgeries for this problem  . TONSILLECTOMY    . TOTAL KNEE ARTHROPLASTY Left 11/25/2014   dr whitfield  . TOTAL KNEE ARTHROPLASTY Left 11/25/2014   Procedure: TOTAL KNEE  ARTHROPLASTY;  Surgeon: Valeria Batman, MD;  Location: [facility] OR;  Service: Orthopedics;  Laterality: Left;   Social History   Social History Narrative  . Not on file    There is no immunization history on file for this patient.   Objective: Vital Signs: BP (!) 157/88 (BP Location: Left Wrist, Patient Position: Sitting, Cuff Size: Normal)   Pulse 65   Resp 16   Ht 5\' 6"  (1.676 m)   Wt 288 lb (130.6 kg)   BMI 46.48 kg/m    Physical Exam Vitals and nursing note reviewed.  Constitutional:      Appearance: She is well-developed.  HENT:     Head: Normocephalic and atraumatic.  Eyes:     Conjunctiva/sclera: Conjunctivae normal.  Cardiovascular:     Rate and Rhythm: Normal rate and regular rhythm.     Heart sounds: Normal heart sounds.  Pulmonary:     Effort: Pulmonary effort is normal.     Breath sounds: Normal breath sounds.  Abdominal:     General: Bowel sounds are normal.     Palpations: Abdomen is soft.  Musculoskeletal:     Cervical back: Normal range of motion.  Lymphadenopathy:     Cervical: No cervical adenopathy.  Skin:    General: Skin is warm and dry.     Capillary Refill: Capillary refill takes less than 2 seconds.  Neurological:     Mental Status: She is alert and oriented to person, place, and time.  Psychiatric:        Behavior: Behavior normal.      Musculoskeletal Exam: C-spine good range of motion.  Thoracic kyphosis noted.  Lumbar spine has good range of motion.  Right shoulder has full range of motion with no discomfort.  Left shoulder abduction to about 120 degrees.  She has painful internal rotation of the left shoulder joint.  Elbow joints, wrist joints, MCPs, PIPs, DIPs good range of motion no synovitis.  She has PIP and DIP synovial thickening consistent with osteoarthritis of both hands.  She has tenderness of the right second DIP joint.  Hip joints have good range of motion with no discomfort.  No tenderness over trochanter bursa bilaterally.   Left knee replacements good range of motion no warmth or effusion.  Right knee has good range of motion no warmth or effusion.  Ankle joints have good range of motion with no tenderness or inflammation.  Lipoma palpated on the lateral aspect of the right ankle.  CDAI Exam: CDAI Score: -- Patient Global: --; Provider Global: -- Swollen: --; Tender: -- Joint Exam 12/23/2019   No joint exam has been documented for this visit   There is currently no information documented on the homunculus. Go to the Rheumatology activity and complete the homunculus joint exam.  Investigation: No additional findings.  Imaging: No results found.  Recent Labs: Lab Results  Component Value Date   WBC 9.8 04/04/2019   HGB 13.1 04/04/2019   PLT 242 04/04/2019   NA 140 04/04/2019   K 4.3 04/04/2019   CL 103 04/04/2019   CO2 29 04/04/2019   GLUCOSE 133 (H) 04/04/2019   BUN 17 04/04/2019   CREATININE 0.75 04/04/2019   BILITOT 0.5  04/04/2019   ALKPHOS 50 01/10/2017   AST 18 04/04/2019   ALT 14 04/04/2019   PROT 7.3 04/04/2019   ALBUMIN 4.1 01/10/2017   CALCIUM 10.2 04/04/2019   GFRAA 102 04/04/2019    Speciality Comments: No specialty comments available.  Procedures:  No procedures performed Allergies: Patient has no known allergies.   Assessment / Plan:     Visit Diagnoses: Fibromyalgia: She has generalized hyperalgesia and positive tender points on exam.  She has been experiencing increased myalgias and arthralgias over the past 3 months which she attributes to cooler weather temperatures.  She takes tramadol 50 mg 1 to 2 tablets by mouth daily as needed and Celebrex 200 mg 1 capsule by mouth daily as needed for pain relief.  She requested a refill of Celebrex and Lidoderm patches which she is found to be effective and for pain management.   She continues to have chronic fatigue related to insomnia.  She has had interrupted sleep at night due to nocturnal pain.We discussed the importance of  regular exercise and good sleep hygiene.  She will follow up in 6 months.   Medication monitoring encounter - Tramadol 50 mg 1 to 2 tablets by mouth daily as needed for pain relief.UDS and narcotic agreement were updated today.  CBC and CMP were also drawn.  The patient takes Celebrex 200 mg 1 capsule by mouth as needed for pain relief.- Plan: Pain Mgmt, Profile 5 w/Conf, U, Pain Mgmt, Tramadol w/medMATCH, U, CBC with Differential/Platelet, COMPLETE METABOLIC PANEL WITH GFR  Primary osteoarthritis of right knee: She has good range of motion of the right knee joint on exam.  No warmth or effusion was noted.  She has occasional discomfort in the right knee which makes it difficult to climb steps or get up from a seated position.  We discussed the importance of lower extremity muscle strengthening.  She was encouraged to walk for exercise on a daily basis.  Status post total left knee replacement using cement: Dr. Durward Fortes. Doing well.  She has good ROM with no discomfort.  No warmth or effusion of knee joint.  She has occasional discomfort in the left knee.    Other fatigue: Chronic but stable.   Primary insomnia: She experiences interrupted sleep at night due to nocturnal pain.  We discussed the importance of good sleep hygiene.  Chronic left shoulder pain: She has limited abduction of the left shoulder to about 120 degrees.  She has painful internal rotation of the left shoulder.  She has been experiencing intermittent discomfort and limited range of motion left shoulder.  She declined to left shoulder joint exercises or cortisone injection today.  She was given a handout of shoulder exercises to perform.  Several exercises were demonstrated in the office today.  We also discussed that she can use Voltaren gel topically as needed for pain relief.  She was advised to notify us if the pain persists or worsens and we can place a referral to physical therapy or bring her back in the office for  x-rays.  Other medical conditions are listed as follows:   History of hypertension  History of hyperlipidemia  Vitamin D deficiency  History of diabetes mellitus  Orders: Orders Placed This Encounter  Procedures  . Pain Mgmt, Profile 5 w/Conf, U  . Pain Mgmt, Tramadol w/medMATCH, U  . CBC with Differential/Platelet  . COMPLETE METABOLIC PANEL WITH GFR   Meds ordered this encounter  Medications  . celecoxib (CELEBREX) 200 MG capsule  Sig: TAKE 1 CAPSULE BY MOUTH DAILY AS NEEDED    Dispense:  30 capsule    Refill:  0  . lidocaine (LIDODERM) 5 %    Sig: Place 1 patch onto the skin daily. Remove & Discard patch within 12 hours or as directed by MD    Dispense:  30 patch    Refill:  0    Face-to-face time spent with patient was 30 minutes. Greater than 50% of time was spent in counseling and coordination of care.  Follow-Up Instructions: Return in about 6 months (around 06/21/2020) for Fibromyalgia, Osteoarthritis.   Gearldine Bienenstock, PA-C  Note - This record has been created using Dragon software.  Chart creation errors have been sought, but may not always  have been located. Such creation errors do not reflect on  the standard of medical care.

## 2019-12-23 ENCOUNTER — Ambulatory Visit: Payer: 59 | Admitting: Physician Assistant

## 2019-12-23 ENCOUNTER — Encounter: Payer: Self-pay | Admitting: Physician Assistant

## 2019-12-23 ENCOUNTER — Other Ambulatory Visit: Payer: Self-pay

## 2019-12-23 VITALS — BP 157/88 | HR 65 | Resp 16 | Ht 66.0 in | Wt 288.0 lb

## 2019-12-23 DIAGNOSIS — Z8639 Personal history of other endocrine, nutritional and metabolic disease: Secondary | ICD-10-CM

## 2019-12-23 DIAGNOSIS — M1711 Unilateral primary osteoarthritis, right knee: Secondary | ICD-10-CM | POA: Diagnosis not present

## 2019-12-23 DIAGNOSIS — Z8679 Personal history of other diseases of the circulatory system: Secondary | ICD-10-CM

## 2019-12-23 DIAGNOSIS — M25512 Pain in left shoulder: Secondary | ICD-10-CM

## 2019-12-23 DIAGNOSIS — E559 Vitamin D deficiency, unspecified: Secondary | ICD-10-CM

## 2019-12-23 DIAGNOSIS — Z5181 Encounter for therapeutic drug level monitoring: Secondary | ICD-10-CM

## 2019-12-23 DIAGNOSIS — M797 Fibromyalgia: Secondary | ICD-10-CM | POA: Diagnosis not present

## 2019-12-23 DIAGNOSIS — F5101 Primary insomnia: Secondary | ICD-10-CM

## 2019-12-23 DIAGNOSIS — R5383 Other fatigue: Secondary | ICD-10-CM

## 2019-12-23 DIAGNOSIS — Z96652 Presence of left artificial knee joint: Secondary | ICD-10-CM

## 2019-12-23 DIAGNOSIS — G8929 Other chronic pain: Secondary | ICD-10-CM

## 2019-12-23 MED ORDER — LIDOCAINE 5 % EX PTCH: 1.0000 | MEDICATED_PATCH | CUTANEOUS | 0 refills | Status: DC

## 2019-12-23 MED ORDER — CELECOXIB 200 MG PO CAPS: ORAL_CAPSULE | ORAL | 0 refills | Status: DC

## 2019-12-23 NOTE — Patient Instructions (Signed)
Shoulder Exercises Ask your health care provider which exercises are safe for you. Do exercises exactly as told by your health care provider and adjust them as directed. It is normal to feel mild stretching, pulling, tightness, or discomfort as you do these exercises. Stop right away if you feel sudden pain or your pain gets worse. Do not begin these exercises until told by your health care provider. Stretching exercises External rotation and abduction This exercise is sometimes called corner stretch. This exercise rotates your arm outward (external rotation) and moves your arm out from your body (abduction). 1. Stand in a doorway with one of your feet slightly in front of the other. This is called a staggered stance. If you cannot reach your forearms to the door frame, stand facing a corner of a room. 2. Choose one of the following positions as told by your health care provider: ? Place your hands and forearms on the door frame above your head. ? Place your hands and forearms on the door frame at the height of your head. ? Place your hands on the door frame at the height of your elbows. 3. Slowly move your weight onto your front foot until you feel a stretch across your chest and in the front of your shoulders. Keep your head and chest upright and keep your abdominal muscles tight. 4. Hold for __________ seconds. 5. To release the stretch, shift your weight to your back foot. Repeat __________ times. Complete this exercise __________ times a day. Extension, standing 1. Stand and hold a broomstick, a cane, or a similar object behind your back. ? Your hands should be a little wider than shoulder width apart. ? Your palms should face away from your back. 2. Keeping your elbows straight and your shoulder muscles relaxed, move the stick away from your body until you feel a stretch in your shoulders (extension). ? Avoid shrugging your shoulders while you move the stick. Keep your shoulder blades tucked  down toward the middle of your back. 3. Hold for __________ seconds. 4. Slowly return to the starting position. Repeat __________ times. Complete this exercise __________ times a day. Range-of-motion exercises Pendulum  1. Stand near a wall or a surface that you can hold onto for balance. 2. Bend at the waist and let your left / right arm hang straight down. Use your other arm to support you. Keep your back straight and do not lock your knees. 3. Relax your left / right arm and shoulder muscles, and move your hips and your trunk so your left / right arm swings freely. Your arm should swing because of the motion of your body, not because you are using your arm or shoulder muscles. 4. Keep moving your hips and trunk so your arm swings in the following directions, as told by your health care provider: ? Side to side. ? Forward and backward. ? In clockwise and counterclockwise circles. 5. Continue each motion for __________ seconds, or for as long as told by your health care provider. 6. Slowly return to the starting position. Repeat __________ times. Complete this exercise __________ times a day. Shoulder flexion, standing  1. Stand and hold a broomstick, a cane, or a similar object. Place your hands a little more than shoulder width apart on the object. Your left / right hand should be palm up, and your other hand should be palm down. 2. Keep your elbow straight and your shoulder muscles relaxed. Push the stick up with your healthy arm to   raise your left / right arm in front of your body, and then over your head until you feel a stretch in your shoulder (flexion). ? Avoid shrugging your shoulder while you raise your arm. Keep your shoulder blade tucked down toward the middle of your back. 3. Hold for __________ seconds. 4. Slowly return to the starting position. Repeat __________ times. Complete this exercise __________ times a day. Shoulder abduction, standing 1. Stand and hold a broomstick,  a cane, or a similar object. Place your hands a little more than shoulder width apart on the object. Your left / right hand should be palm up, and your other hand should be palm down. 2. Keep your elbow straight and your shoulder muscles relaxed. Push the object across your body toward your left / right side. Raise your left / right arm to the side of your body (abduction) until you feel a stretch in your shoulder. ? Do not raise your arm above shoulder height unless your health care provider tells you to do that. ? If directed, raise your arm over your head. ? Avoid shrugging your shoulder while you raise your arm. Keep your shoulder blade tucked down toward the middle of your back. 3. Hold for __________ seconds. 4. Slowly return to the starting position. Repeat __________ times. Complete this exercise __________ times a day. Internal rotation  1. Place your left / right hand behind your back, palm up. 2. Use your other hand to dangle an exercise band, a towel, or a similar object over your shoulder. Grasp the band with your left / right hand so you are holding on to both ends. 3. Gently pull up on the band until you feel a stretch in the front of your left / right shoulder. The movement of your arm toward the center of your body is called internal rotation. ? Avoid shrugging your shoulder while you raise your arm. Keep your shoulder blade tucked down toward the middle of your back. 4. Hold for __________ seconds. 5. Release the stretch by letting go of the band and lowering your hands. Repeat __________ times. Complete this exercise __________ times a day. Strengthening exercises External rotation  1. Sit in a stable chair without armrests. 2. Secure an exercise band to a stable object at elbow height on your left / right side. 3. Place a soft object, such as a folded towel or a small pillow, between your left / right upper arm and your body to move your elbow about 4 inches (10 cm) away  from your side. 4. Hold the end of the exercise band so it is tight and there is no slack. 5. Keeping your elbow pressed against the soft object, slowly move your forearm out, away from your abdomen (external rotation). Keep your body steady so only your forearm moves. 6. Hold for __________ seconds. 7. Slowly return to the starting position. Repeat __________ times. Complete this exercise __________ times a day. Shoulder abduction  1. Sit in a stable chair without armrests, or stand up. 2. Hold a __________ weight in your left / right hand, or hold an exercise band with both hands. 3. Start with your arms straight down and your left / right palm facing in, toward your body. 4. Slowly lift your left / right hand out to your side (abduction). Do not lift your hand above shoulder height unless your health care provider tells you that this is safe. ? Keep your arms straight. ? Avoid shrugging your shoulder while you   do this movement. Keep your shoulder blade tucked down toward the middle of your back. 5. Hold for __________ seconds. 6. Slowly lower your arm, and return to the starting position. Repeat __________ times. Complete this exercise __________ times a day. Shoulder extension 1. Sit in a stable chair without armrests, or stand up. 2. Secure an exercise band to a stable object in front of you so it is at shoulder height. 3. Hold one end of the exercise band in each hand. Your palms should face each other. 4. Straighten your elbows and lift your hands up to shoulder height. 5. Step back, away from the secured end of the exercise band, until the band is tight and there is no slack. 6. Squeeze your shoulder blades together as you pull your hands down to the sides of your thighs (extension). Stop when your hands are straight down by your sides. Do not let your hands go behind your body. 7. Hold for __________ seconds. 8. Slowly return to the starting position. Repeat __________ times.  Complete this exercise __________ times a day. Shoulder row 1. Sit in a stable chair without armrests, or stand up. 2. Secure an exercise band to a stable object in front of you so it is at waist height. 3. Hold one end of the exercise band in each hand. Position your palms so that your thumbs are facing the ceiling (neutral position). 4. Bend each of your elbows to a 90-degree angle (right angle) and keep your upper arms at your sides. 5. Step back until the band is tight and there is no slack. 6. Slowly pull your elbows back behind you. 7. Hold for __________ seconds. 8. Slowly return to the starting position. Repeat __________ times. Complete this exercise __________ times a day. Shoulder press-ups  1. Sit in a stable chair that has armrests. Sit upright, with your feet flat on the floor. 2. Put your hands on the armrests so your elbows are bent and your fingers are pointing forward. Your hands should be about even with the sides of your body. 3. Push down on the armrests and use your arms to lift yourself off the chair. Straighten your elbows and lift yourself up as much as you comfortably can. ? Move your shoulder blades down, and avoid letting your shoulders move up toward your ears. ? Keep your feet on the ground. As you get stronger, your feet should support less of your body weight as you lift yourself up. 4. Hold for __________ seconds. 5. Slowly lower yourself back into the chair. Repeat __________ times. Complete this exercise __________ times a day. Wall push-ups  1. Stand so you are facing a stable wall. Your feet should be about one arm-length away from the wall. 2. Lean forward and place your palms on the wall at shoulder height. 3. Keep your feet flat on the floor as you bend your elbows and lean forward toward the wall. 4. Hold for __________ seconds. 5. Straighten your elbows to push yourself back to the starting position. Repeat __________ times. Complete this exercise  __________ times a day. This information is not intended to replace advice given to you by your health care provider. Make sure you discuss any questions you have with your health care provider. Document Revised: 02/08/2019 Document Reviewed: 11/16/2018 Elsevier Patient Education  2020 [employer]  

## 2019-12-24 ENCOUNTER — Telehealth: Payer: Self-pay | Admitting: *Deleted

## 2019-12-24 NOTE — Telephone Encounter (Signed)
Submitted a Prior Authorization request to [employer] for Lidocaine Patches via Cover My Meds. Will update once we receive a response.   

## 2019-12-24 NOTE — Progress Notes (Signed)
CBC WNL. Glucose is 163.  Rest of CMP WNL.

## 2019-12-25 LAB — CBC WITH DIFFERENTIAL/PLATELET
Absolute Monocytes: 333 cells/uL (ref 200–950)
Basophils Absolute: 41 cells/uL (ref 0–200)
Basophils Relative: 0.6 %
Eosinophils Absolute: 299 cells/uL (ref 15–500)
Eosinophils Relative: 4.4 %
HCT: 40.1 % (ref 35.0–45.0)
Hemoglobin: 13.2 g/dL (ref 11.7–15.5)
Lymphs Abs: 2638 cells/uL (ref 850–3900)
MCH: 27 pg (ref 27.0–33.0)
MCHC: 32.9 g/dL (ref 32.0–36.0)
MCV: 82.2 fL (ref 80.0–100.0)
MPV: 11 fL (ref 7.5–12.5)
Monocytes Relative: 4.9 %
Neutro Abs: 3488 cells/uL (ref 1500–7800)
Neutrophils Relative %: 51.3 %
Platelets: 226 10*3/uL (ref 140–400)
RBC: 4.88 10*6/uL (ref 3.80–5.10)
RDW: 12.3 % (ref 11.0–15.0)
Total Lymphocyte: 38.8 %
WBC: 6.8 10*3/uL (ref 3.8–10.8)

## 2019-12-25 LAB — PAIN MGMT, PROFILE 5 W/CONF, U
Amphetamines: NEGATIVE ng/mL
Barbiturates: NEGATIVE ng/mL
Benzodiazepines: NEGATIVE ng/mL
Cocaine Metabolite: NEGATIVE ng/mL
Creatinine: 171.8 mg/dL
Marijuana Metabolite: NEGATIVE ng/mL
Methadone Metabolite: NEGATIVE ng/mL
Opiates: NEGATIVE ng/mL
Oxidant: NEGATIVE ug/mL
Oxycodone: NEGATIVE ng/mL
pH: 6.1 (ref 4.5–9.0)

## 2019-12-25 LAB — COMPLETE METABOLIC PANEL WITH GFR
AG Ratio: 1.7 (calc) (ref 1.0–2.5)
ALT: 15 U/L (ref 6–29)
AST: 17 U/L (ref 10–35)
Albumin: 4.3 g/dL (ref 3.6–5.1)
Alkaline phosphatase (APISO): 52 U/L (ref 37–153)
BUN: 15 mg/dL (ref 7–25)
CO2: 28 mmol/L (ref 20–32)
Calcium: 9.9 mg/dL (ref 8.6–10.4)
Chloride: 103 mmol/L (ref 98–110)
Creat: 0.71 mg/dL (ref 0.50–1.05)
GFR, Est African American: 108 mL/min/{1.73_m2} (ref >=60)
GFR, Est Non African American: 93 mL/min/{1.73_m2} (ref >=60)
Globulin: 2.6 g/dL (calc) (ref 1.9–3.7)
Glucose, Bld: 163 mg/dL — ABNORMAL HIGH (ref 65–99)
Potassium: 4.5 mmol/L (ref 3.5–5.3)
Sodium: 140 mmol/L (ref 135–146)
Total Bilirubin: 0.4 mg/dL (ref 0.2–1.2)
Total Protein: 6.9 g/dL (ref 6.1–8.1)

## 2019-12-25 LAB — PAIN MGMT, TRAMADOL W/MEDMATCH, U
Desmethyltramadol: 3687 ng/mL
Tramadol: 7802 ng/mL

## 2019-12-25 NOTE — Progress Notes (Signed)
UDS is consistent with treatment.

## 2019-12-26 NOTE — Telephone Encounter (Signed)
Received notification from [facility] regarding a prior authorization for Lidocaine patches. Authorization has been APPROVED from 12/24/2019 to 12/23/2020 .   Authorization # [id number] Phone #  [phone].

## 2020-02-10 ENCOUNTER — Other Ambulatory Visit: Payer: Self-pay | Admitting: Physician Assistant

## 2020-02-10 NOTE — Telephone Encounter (Signed)
Last Visit: 12/23/19 Next Visit: 06/22/20 UDS:12/23/19 Narc Agreement: 12/23/19  Okay to refill Tramadol?

## 2020-04-09 ENCOUNTER — Other Ambulatory Visit: Payer: Self-pay | Admitting: Physician Assistant

## 2020-04-09 NOTE — Telephone Encounter (Signed)
Last Visit: 12/23/19 Next Visit: 06/22/20 UDS:12/23/19 Narc Agreement: 12/23/19  Last Fill: 02/10/2020  Okay to refill Tramadol?

## 2020-05-22 ENCOUNTER — Other Ambulatory Visit: Payer: Self-pay | Admitting: Physician Assistant

## 2020-05-22 NOTE — Telephone Encounter (Signed)
Last Visit: 12/23/19 Next Visit: 06/22/20 Labs: 12/23/2019 CBC WNL. Glucose is 163. Rest of CMP WNL.   Okay to refill per Dr. Corliss Skains

## 2020-06-08 NOTE — Progress Notes (Deleted)
Office Visit Note  Patient: Marie [name]             Date of Birth: [birth year]           MRN: [id number]             PCP: Lupita Raider, MD Referring: Lupita Raider, MD Visit Date: 06/22/2020 Occupation: @GUAROCC @  Subjective:  No chief complaint on file.   History of Present Illness: Marie [name] is a 59 y.o. female ***   Activities of Daily Living:  Patient reports morning stiffness for *** {minute/hour:19697}.   Patient {ACTIONS;DENIES/REPORTS:21021675::"Denies"} nocturnal pain.  Difficulty dressing/grooming: {ACTIONS;DENIES/REPORTS:21021675::"Denies"} Difficulty climbing stairs: {ACTIONS;DENIES/REPORTS:21021675::"Denies"} Difficulty getting out of chair: {ACTIONS;DENIES/REPORTS:21021675::"Denies"} Difficulty using hands for taps, buttons, cutlery, and/or writing: {ACTIONS;DENIES/REPORTS:21021675::"Denies"}  No Rheumatology ROS completed.   PMFS History:  Patient Active Problem List   Diagnosis Date Noted  . Fibromyalgia 01/02/2017  . Other fatigue 01/02/2017  . Primary insomnia 01/02/2017  . Primary osteoarthritis of both knees 01/02/2017  . History of diabetes mellitus 01/02/2017  . History of hyperlipidemia 01/02/2017  . Primary osteoarthritis of left knee 11/25/2014  . Diabetes mellitus type 2 in obese (HCC) 11/25/2014  . Severe obesity (BMI >= 40) (HCC) 11/25/2014  . Hypertension 11/25/2014  . S/P total knee replacement using cement 11/25/2014  . RUQ PAIN 03/14/2007    Past Medical History:  Diagnosis Date  . Arthritis    knees, knees, neck, shoulders, hands   . Diabetes mellitus without complication (HCC)    borderline, states she is using Victoza for weight loss, Rx by PCP  . Fibromyalgia    followed by Dr. 03/16/2007  . Hypertension     Family History  Problem Relation Age of Onset  . Diabetes Mother   . Cancer Mother   . Diabetes Father    Past Surgical History:  Procedure Laterality Date  . ABDOMINAL HYSTERECTOMY  2000  .  MYOMECTOMY     several surgeries for this problem  . TONSILLECTOMY    . TOTAL KNEE ARTHROPLASTY Left 11/25/2014   dr whitfield  . TOTAL KNEE ARTHROPLASTY Left 11/25/2014   Procedure: TOTAL KNEE ARTHROPLASTY;  Surgeon: 11/27/2014, MD;  Location: [facility];  Service: Orthopedics;  Laterality: Left;   Social History   Social History Narrative  . Not on file    There is no immunization history on file for this patient.   Objective: Vital Signs: There were no vitals taken for this visit.   Physical Exam   Musculoskeletal Exam: ***  CDAI Exam: CDAI Score: -- Patient Global: --; Provider Global: -- Swollen: --; Tender: -- Joint Exam 06/22/2020   No joint exam has been documented for this visit   There is currently no information documented on the homunculus. Go to the Rheumatology activity and complete the homunculus joint exam.  Investigation: No additional findings.  Imaging: No results found.  Recent Labs: Lab Results  Component Value Date   WBC 6.8 12/23/2019   HGB 13.2 12/23/2019   PLT 226 12/23/2019   NA 140 12/23/2019   K 4.5 12/23/2019   CL 103 12/23/2019   CO2 28 12/23/2019   GLUCOSE 163 (H) 12/23/2019   BUN 15 12/23/2019   CREATININE 0.71 12/23/2019   BILITOT 0.4 12/23/2019   ALKPHOS 50 01/10/2017   AST 17 12/23/2019   ALT 15 12/23/2019   PROT 6.9 12/23/2019   ALBUMIN 4.1 01/10/2017   CALCIUM 9.9 12/23/2019   GFRAA 108 12/23/2019    Speciality Comments:  No specialty comments available.  Procedures:  No procedures performed Allergies: Patient has no known allergies.   Assessment / Plan:     Visit Diagnoses: No diagnosis found.  Orders: No orders of the defined types were placed in this encounter.  No orders of the defined types were placed in this encounter.   Face-to-face time spent with patient was *** minutes. Greater than 50% of time was spent in counseling and coordination of care.  Follow-Up Instructions: No follow-ups on  file.   Ellen Henri, CMA  Note - This record has been created using Animal nutritionist.  Chart creation errors have been sought, but may not always  have been located. Such creation errors do not reflect on  the standard of medical care.

## 2020-06-12 ENCOUNTER — Other Ambulatory Visit: Payer: Self-pay | Admitting: Physician Assistant

## 2020-06-12 NOTE — Telephone Encounter (Signed)
Last Visit: 12/23/19 Next Visit: 06/22/20 UDS:12/23/19 Narc Agreement: 12/23/19  Last Fill: 04/09/2020  Okay to refill Tramadol?

## 2020-06-17 NOTE — Progress Notes (Signed)
Office Visit Note  Patient: Marie [name]             Date of Birth: 12-23-1960           MRN: [id number]             PCP: Lupita Raider, MD Referring: Lupita Raider, MD Visit Date: 07/01/2020 Occupation: @GUAROCC @  Subjective:  Trochanteric bursitis bilaterally   History of Present Illness: Marie [name] is a 59 y.o. female with history of fibromyalgia and osteoarthritis.  Patient continues to have intermittent myalgias and muscle tenderness due to underlying fibromyalgia.  She has been having more frequent flares recently which she attributes to increased stress, not sleeping well at night, and weather changes.  She continues to take tramadol 50 mg 1 to 2 tablets by mouth daily as needed and Celebrex 200 mg 1 capsule by mouth daily as needed for pain relief.  She has no longer been using Lidoderm patches due to insurance not covering them.  She felt Lidoderm patches to be very effective in the past.  She continues to experience discomfort due to trochanter bursitis bilaterally.  She also has chronic pain in both hands even the left knee which is replaced.  She notices intermittent swelling in her right knee joint.  She experiences interrupted sleep at night due to discomfort related to trochanter bursitis bilaterally.  She performs stretching exercises occasionally.     Activities of Daily Living:  Patient reports morning stiffness for-30 minutes.   Patient Reports nocturnal pain.  Difficulty dressing/grooming: Denies Difficulty climbing stairs: Denies Difficulty getting out of chair: Denies Difficulty using hands for taps, buttons, cutlery, and/or writing: Denies  Review of Systems  Constitutional: Positive for fatigue.  HENT: Negative for mouth sores, mouth dryness and nose dryness.   Eyes: Negative for itching and dryness.  Respiratory: Negative for shortness of breath and difficulty breathing.   Cardiovascular: Negative for chest pain and palpitations.    Gastrointestinal: Negative for blood in stool, constipation and diarrhea.  Endocrine: Negative for increased urination.  Genitourinary: Negative for difficulty urinating.  Musculoskeletal: Positive for arthralgias, joint pain, joint swelling, myalgias, morning stiffness, muscle tenderness and myalgias.  Skin: Negative for color change, rash and redness.  Allergic/Immunologic: Negative for susceptible to infections.  Neurological: Negative for dizziness, numbness, headaches, memory loss and weakness.  Hematological: Negative for bruising/bleeding tendency.  Psychiatric/Behavioral: Negative for confusion.    PMFS History:  Patient Active Problem List   Diagnosis Date Noted  . Fibromyalgia 01/02/2017  . Other fatigue 01/02/2017  . Primary insomnia 01/02/2017  . Primary osteoarthritis of both knees 01/02/2017  . History of diabetes mellitus 01/02/2017  . History of hyperlipidemia 01/02/2017  . Primary osteoarthritis of left knee 11/25/2014  . Diabetes mellitus type 2 in obese (HCC) 11/25/2014  . Severe obesity (BMI >= 40) (HCC) 11/25/2014  . Hypertension 11/25/2014  . S/P total knee replacement using cement 11/25/2014  . RUQ PAIN 03/14/2007    Past Medical History:  Diagnosis Date  . Arthritis    knees, knees, neck, shoulders, hands   . Diabetes mellitus without complication (HCC)    borderline, states she is using Victoza for weight loss, Rx by PCP  . Fibromyalgia    followed by Dr. 03/16/2007  . Hypertension     Family History  Problem Relation Age of Onset  . Diabetes Mother   . Cancer Mother   . Diabetes Father    Past Surgical History:  Procedure Laterality Date  .  ABDOMINAL HYSTERECTOMY  2000  . MYOMECTOMY     several surgeries for this problem  . TONSILLECTOMY    . TOTAL KNEE ARTHROPLASTY Left 11/25/2014   dr whitfield  . TOTAL KNEE ARTHROPLASTY Left 11/25/2014   Procedure: TOTAL KNEE ARTHROPLASTY;  Surgeon: Valeria Batman, MD;  Location: [facility] OR;  Service:  Orthopedics;  Laterality: Left;   Social History   Social History Narrative  . Not on file   Immunization History  Administered Date(s) Administered  . PFIZER SARS-COV-2 Vaccination 03/07/2020, 03/28/2020     Objective: Vital Signs: BP 121/80 (BP Location: Left Arm, Patient Position: Sitting, Cuff Size: Large)   Pulse 65   Resp 17   Ht 5\' 6"  (1.676 m)   Wt 280 lb (127 kg)   BMI 45.19 kg/m    Physical Exam Vitals and nursing note reviewed.  Constitutional:      Appearance: She is well-developed.  HENT:     Head: Normocephalic and atraumatic.  Eyes:     Conjunctiva/sclera: Conjunctivae normal.  Cardiovascular:     Rate and Rhythm: Normal rate.  Pulmonary:     Effort: Pulmonary effort is normal.  Abdominal:     Palpations: Abdomen is soft.  Musculoskeletal:     Cervical back: Normal range of motion.  Skin:    General: Skin is warm and dry.     Capillary Refill: Capillary refill takes less than 2 seconds.  Neurological:     Mental Status: She is alert and oriented to person, place, and time.  Psychiatric:        Behavior: Behavior normal.      Musculoskeletal Exam: C-spine, thoracic spine, and lumbar spine good ROM.  Shoulder joints, elbow joints, wrist joints, MCPs, PIPs, and DIPs good ROM with no synovitis.  Complete fist formation bilaterally.  Mild PIP and DIP thickening consistent with osteoarthritis of both hands.  Hip joints good ROM with no discomfort.  Tenderness over both trochanteric bursa. Right knee has good ROM with no warmth or effusion.  Left knee replacement good ROM with no discomfort.  Ankle joints good ROM with no tenderness.    CDAI Exam: CDAI Score: -- Patient Global: --; Provider Global: -- Swollen: --; Tender: -- Joint Exam 07/01/2020   No joint exam has been documented for this visit   There is currently no information documented on the homunculus. Go to the Rheumatology activity and complete the homunculus joint  exam.  Investigation: No additional findings.  Imaging: No results found.  Recent Labs: Lab Results  Component Value Date   WBC 6.8 12/23/2019   HGB 13.2 12/23/2019   PLT 226 12/23/2019   NA 140 12/23/2019   K 4.5 12/23/2019   CL 103 12/23/2019   CO2 28 12/23/2019   GLUCOSE 163 (H) 12/23/2019   BUN 15 12/23/2019   CREATININE 0.71 12/23/2019   BILITOT 0.4 12/23/2019   ALKPHOS 50 01/10/2017   AST 17 12/23/2019   ALT 15 12/23/2019   PROT 6.9 12/23/2019   ALBUMIN 4.1 01/10/2017   CALCIUM 9.9 12/23/2019   GFRAA 108 12/23/2019    Speciality Comments: No specialty comments available.  Procedures:  No procedures performed Allergies: Patient has no known allergies.   Assessment / Plan:     Visit Diagnoses: Fibromyalgia: She experiences intermittent myalgias muscle tenderness due to underlying fibromyalgia.  Has been having more frequent flares recently.  She has been under increased stress and has not been sleeping as well at night.  She is experiencing  trochanter bursitis bilaterally.  She has tenderness to palpation on examination today.  We discussed the importance of performing stretching exercises on a daily basis.  She was given and these exercises to perform.  We also discussed the option of physical therapy but she declined at this time.  She plans on continuing to take tramadol 50 mg 1 to 2 tablets daily as needed for pain relief and Celebrex 200 mg 1 capsule by mouth daily as needed.  UDS, narcotic agreement, CBC, and CMP were updated today.  We discussed the importance of regular exercise and good sleep hygiene.  She will follow-up in the office in 6 months.  Medication monitoring encounter -UDS narcotic agreement were updated today on March 31, 2020.  CBC and CMP will be drawn today to monitor for drug toxicity.  She does not need any refills of tramadol or Celebrex at this time.  Plan: DRUG MONITOR, TRAMADOL,QN, URINE, DRUG MONITOR, PANEL 5, W/CONF, URINE, COMPLETE METABOLIC  PANEL WITH GFR, CBC with Differential/Platelet  Trochanteric bursitis of both hips: She has tenderness palpation over bilateral trochanteric bursa.  She experiences nocturnal pain when laying on her sides at night.  We discussed the importance of performing stretching exercises on a daily basis.  She was given a handout of these exercises to perform.  She declined physical therapy at this time but was advised to notify us if her symptoms persist or worsen.  Primary osteoarthritis of right knee: She has chronic right knee joint pain.  No recent injuries or falls.  She is not experiencing any mechanical symptoms.  She has good range of motion exam with no warmth or effusion.  Different treatment options were discussed today.  We discussed the use of Voltaren gel but she has not found it to be effective in the past.  We also discussed the option of physical therapy but she declined.  She was encouraged to perform knee joint exercises and was given a handout of these to perform.  We also discussed applying for Visco gel injections in the future but she would like to hold off at this time.  Status post total left knee replacement using cement: Chronic pain.  She has good ROM on exam today with no warmth or effusion.  She was given a handout of knee joint exercises to perform.   Other fatigue: She has chronic fatigue secondary to insomnia.  We discussed the importance of regular exercise.  Primary insomnia: She has interrupted sleep at night due to nocturnal pain secondary to trochanteric bursitis bilaterally.  We discussed several treatment options to help with the bursitis she is experiencing.  We also discussed the use of melatonin and good sleep hygiene.   Other medical conditions are listed as follows:   History of hypertension: BP was 121/80 today.   History of hyperlipidemia: She is taking simvastatin 40 mg at bedtime.   Vitamin D deficiency  History of diabetes mellitus    Orders: Orders  Placed This Encounter  Procedures  . DRUG MONITOR, TRAMADOL,QN, URINE  . DRUG MONITOR, PANEL 5, W/CONF, URINE  . COMPLETE METABOLIC PANEL WITH GFR  . CBC with Differential/Platelet   No orders of the defined types were placed in this encounter.    Follow-Up Instructions: Return in about 6 months (around 12/29/2020) for Fibromyalgia, Osteoarthritis.   Gearldine Bienenstock, PA-C  Note - This record has been created using Dragon software.  Chart creation errors have been sought, but may not always  have been located.  Such creation errors do not reflect on  the standard of medical care.

## 2020-06-22 ENCOUNTER — Ambulatory Visit: Payer: 59 | Admitting: Physician Assistant

## 2020-07-01 ENCOUNTER — Other Ambulatory Visit: Payer: Self-pay

## 2020-07-01 ENCOUNTER — Ambulatory Visit: Payer: 59 | Admitting: Physician Assistant

## 2020-07-01 ENCOUNTER — Encounter: Payer: Self-pay | Admitting: Physician Assistant

## 2020-07-01 VITALS — BP 121/80 | HR 65 | Resp 17 | Ht 66.0 in | Wt 280.0 lb

## 2020-07-01 DIAGNOSIS — Z5181 Encounter for therapeutic drug level monitoring: Secondary | ICD-10-CM | POA: Diagnosis not present

## 2020-07-01 DIAGNOSIS — Z8679 Personal history of other diseases of the circulatory system: Secondary | ICD-10-CM

## 2020-07-01 DIAGNOSIS — Z96652 Presence of left artificial knee joint: Secondary | ICD-10-CM

## 2020-07-01 DIAGNOSIS — E559 Vitamin D deficiency, unspecified: Secondary | ICD-10-CM

## 2020-07-01 DIAGNOSIS — M1711 Unilateral primary osteoarthritis, right knee: Secondary | ICD-10-CM | POA: Diagnosis not present

## 2020-07-01 DIAGNOSIS — M7062 Trochanteric bursitis, left hip: Secondary | ICD-10-CM

## 2020-07-01 DIAGNOSIS — F5101 Primary insomnia: Secondary | ICD-10-CM

## 2020-07-01 DIAGNOSIS — Z8639 Personal history of other endocrine, nutritional and metabolic disease: Secondary | ICD-10-CM

## 2020-07-01 DIAGNOSIS — M797 Fibromyalgia: Secondary | ICD-10-CM | POA: Diagnosis not present

## 2020-07-01 DIAGNOSIS — M7061 Trochanteric bursitis, right hip: Secondary | ICD-10-CM

## 2020-07-01 DIAGNOSIS — R5383 Other fatigue: Secondary | ICD-10-CM

## 2020-07-01 NOTE — Patient Instructions (Signed)
Hip Bursitis Rehab Ask your health care provider which exercises are safe for you. Do exercises exactly as told by your health care provider and adjust them as directed. It is normal to feel mild stretching, pulling, tightness, or discomfort as you do these exercises. Stop right away if you feel sudden pain or your pain gets worse. Do not begin these exercises until told by your health care provider. Stretching exercise This exercise warms up your muscles and joints and improves the movement and flexibility of your hip. This exercise also helps to relieve pain and stiffness. Iliotibial band stretch An iliotibial band is a strong band of muscle tissue that runs from the outer side of your hip to the outer side of your thigh and knee. 1. Lie on your side with your left / right leg in the top position. 2. Bend your left / right knee and grab your ankle. Stretch out your bottom arm to help you balance. 3. Slowly bring your knee back so your thigh is behind your body. 4. Slowly lower your knee toward the floor until you feel a gentle stretch on the outside of your left / right thigh. If you do not feel a stretch and your knee will not fall farther, place the heel of your other foot on top of your knee and pull your knee down toward the floor with your foot. 5. Hold this position for __________ seconds. 6. Slowly return to the starting position. Repeat __________ times. Complete this exercise __________ times a day. Strengthening exercises These exercises build strength and endurance in your hip and pelvis. Endurance is the ability to use your muscles for a long time, even after they get tired. Bridge This exercise strengthens the muscles that move your thigh backward (hip extensors). 1. Lie on your back on a firm surface with your knees bent and your feet flat on the floor. 2. Tighten your buttocks muscles and lift your buttocks off the floor until your trunk is level with your thighs. ? Do not arch  your back. ? You should feel the muscles working in your buttocks and the back of your thighs. If you do not feel these muscles, slide your feet 1-2 inches (2.5-5 cm) farther away from your buttocks. ? If this exercise is too easy, try doing it with your arms crossed over your chest. 3. Hold this position for __________ seconds. 4. Slowly lower your hips to the starting position. 5. Let your muscles relax completely after each repetition. Repeat __________ times. Complete this exercise __________ times a day. Squats This exercise strengthens the muscles in front of your thigh and knee (quadriceps). 1. Stand in front of a table, with your feet and knees pointing straight ahead. You may rest your hands on the table for balance but not for support. 2. Slowly bend your knees and lower your hips like you are going to sit in a chair. ? Keep your weight over your heels, not over your toes. ? Keep your lower legs upright so they are parallel with the table legs. ? Do not let your hips go lower than your knees. ? Do not bend lower than told by your health care provider. ? If your hip pain increases, do not bend as low. 3. Hold the squat position for __________ seconds. 4. Slowly push with your legs to return to standing. Do not use your hands to pull yourself to standing. Repeat __________ times. Complete this exercise __________ times a day. Hip hike 1. Stand  sideways on a bottom step. Stand on your left / right leg with your other foot unsupported next to the step. You can hold on to the railing or wall for balance if needed. 2. Keep your knees straight and your torso square. Then lift your left / right hip up toward the ceiling. 3. Hold this position for __________ seconds. 4. Slowly let your left / right hip lower toward the floor, past the starting position. Your foot should get closer to the floor. Do not lean or bend your knees. Repeat __________ times. Complete this exercise __________ times a  day. Single leg stand 1. Without shoes, stand near a railing or in a doorway. You may hold on to the railing or door frame as needed for balance. 2. Squeeze your left / right buttock muscles, then lift up your other foot. ? Do not let your left / right hip push out to the side. ? It is helpful to stand in front of a mirror for this exercise so you can watch your hip. 3. Hold this position for __________ seconds. Repeat __________ times. Complete this exercise __________ times a day. This information is not intended to replace advice given to you by your health care provider. Make sure you discuss any questions you have with your health care provider. Document Revised: 02/11/2019 Document Reviewed: 02/11/2019 Elsevier Patient Education  2020 [employer].   Journal for Nurse Practitioners, 15(4), [phone]. Retrieved August 06, 2018 from http://clinicalkey.com/nursing">  Knee Exercises Ask your health care provider which exercises are safe for you. Do exercises exactly as told by your health care provider and adjust them as directed. It is normal to feel mild stretching, pulling, tightness, or discomfort as you do these exercises. Stop right away if you feel sudden pain or your pain gets worse. Do not begin these exercises until told by your health care provider. Stretching and range-of-motion exercises These exercises warm up your muscles and joints and improve the movement and flexibility of your knee. These exercises also help to relieve pain and swelling. Knee extension, prone 1. Lie on your abdomen (prone position) on a bed. 2. Place your left / right knee just beyond the edge of the surface so your knee is not on the bed. You can put a towel under your left / right thigh just above your kneecap for comfort. 3. Relax your leg muscles and allow gravity to straighten your knee (extension). You should feel a stretch behind your left / right knee. 4. Hold this position for __________  seconds. 5. Scoot up so your knee is supported between repetitions. Repeat __________ times. Complete this exercise __________ times a day. Knee flexion, active  1. Lie on your back with both legs straight. If this causes back discomfort, bend your left / right knee so your foot is flat on the floor. 2. Slowly slide your left / right heel back toward your buttocks. Stop when you feel a gentle stretch in the front of your knee or thigh (flexion). 3. Hold this position for __________ seconds. 4. Slowly slide your left / right heel back to the starting position. Repeat __________ times. Complete this exercise __________ times a day. Quadriceps stretch, prone  1. Lie on your abdomen on a firm surface, such as a bed or padded floor. 2. Bend your left / right knee and hold your ankle. If you cannot reach your ankle or pant leg, loop a belt around your foot and grab the belt instead. 3. Gently pull  your heel toward your buttocks. Your knee should not slide out to the side. You should feel a stretch in the front of your thigh and knee (quadriceps). 4. Hold this position for __________ seconds. Repeat __________ times. Complete this exercise __________ times a day. Hamstring, supine 1. Lie on your back (supine position). 2. Loop a belt or towel over the ball of your left / right foot. The ball of your foot is on the walking surface, right under your toes. 3. Straighten your left / right knee and slowly pull on the belt to raise your leg until you feel a gentle stretch behind your knee (hamstring). ? Do not let your knee bend while you do this. ? Keep your other leg flat on the floor. 4. Hold this position for __________ seconds. Repeat __________ times. Complete this exercise __________ times a day. Strengthening exercises These exercises build strength and endurance in your knee. Endurance is the ability to use your muscles for a long time, even after they get tired. Quadriceps, isometric This  exercise stretches the muscles in front of your thigh (quadriceps) without moving your knee joint (isometric). 1. Lie on your back with your left / right leg extended and your other knee bent. Put a rolled towel or small pillow under your knee if told by your health care provider. 2. Slowly tense the muscles in the front of your left / right thigh. You should see your kneecap slide up toward your hip or see increased dimpling just above the knee. This motion will push the back of the knee toward the floor. 3. For __________ seconds, hold the muscle as tight as you can without increasing your pain. 4. Relax the muscles slowly and completely. Repeat __________ times. Complete this exercise __________ times a day. Straight leg raises This exercise stretches the muscles in front of your thigh (quadriceps) and the muscles that move your hips (hip flexors). 1. Lie on your back with your left / right leg extended and your other knee bent. 2. Tense the muscles in the front of your left / right thigh. You should see your kneecap slide up or see increased dimpling just above the knee. Your thigh may even shake a bit. 3. Keep these muscles tight as you raise your leg 4-6 inches (10-15 cm) off the floor. Do not let your knee bend. 4. Hold this position for __________ seconds. 5. Keep these muscles tense as you lower your leg. 6. Relax your muscles slowly and completely after each repetition. Repeat __________ times. Complete this exercise __________ times a day. Hamstring, isometric 1. Lie on your back on a firm surface. 2. Bend your left / right knee about __________ degrees. 3. Dig your left / right heel into the surface as if you are trying to pull it toward your buttocks. Tighten the muscles in the back of your thighs (hamstring) to "dig" as hard as you can without increasing any pain. 4. Hold this position for __________ seconds. 5. Release the tension gradually and allow your muscles to relax  completely for __________ seconds after each repetition. Repeat __________ times. Complete this exercise __________ times a day. Hamstring curls If told by your health care provider, do this exercise while wearing ankle weights. Begin with __________ lb weights. Then increase the weight by 1 lb (0.5 kg) increments. Do not wear ankle weights that are more than __________ lb. 1. Lie on your abdomen with your legs straight. 2. Bend your left / right knee as far  as you can without feeling pain. Keep your hips flat against the floor. 3. Hold this position for __________ seconds. 4. Slowly lower your leg to the starting position. Repeat __________ times. Complete this exercise __________ times a day. Squats This exercise strengthens the muscles in front of your thigh and knee (quadriceps). 1. Stand in front of a table, with your feet and knees pointing straight ahead. You may rest your hands on the table for balance but not for support. 2. Slowly bend your knees and lower your hips like you are going to sit in a chair. ? Keep your weight over your heels, not over your toes. ? Keep your lower legs upright so they are parallel with the table legs. ? Do not let your hips go lower than your knees. ? Do not bend lower than told by your health care provider. ? If your knee pain increases, do not bend as low. 3. Hold the squat position for __________ seconds. 4. Slowly push with your legs to return to standing. Do not use your hands to pull yourself to standing. Repeat __________ times. Complete this exercise __________ times a day. Wall slides This exercise strengthens the muscles in front of your thigh and knee (quadriceps). 1. Lean your back against a smooth wall or door, and walk your feet out 18-24 inches (46-61 cm) from it. 2. Place your feet hip-width apart. 3. Slowly slide down the wall or door until your knees bend __________ degrees. Keep your knees over your heels, not over your toes. Keep  your knees in line with your hips. 4. Hold this position for __________ seconds. Repeat __________ times. Complete this exercise __________ times a day. Straight leg raises This exercise strengthens the muscles that rotate the leg at the hip and move it away from your body (hip abductors). 1. Lie on your side with your left / right leg in the top position. Lie so your head, shoulder, knee, and hip line up. You may bend your bottom knee to help you keep your balance. 2. Roll your hips slightly forward so your hips are stacked directly over each other and your left / right knee is facing forward. 3. Leading with your heel, lift your top leg 4-6 inches (10-15 cm). You should feel the muscles in your outer hip lifting. ? Do not let your foot drift forward. ? Do not let your knee roll toward the ceiling. 4. Hold this position for __________ seconds. 5. Slowly return your leg to the starting position. 6. Let your muscles relax completely after each repetition. Repeat __________ times. Complete this exercise __________ times a day. Straight leg raises This exercise stretches the muscles that move your hips away from the front of the pelvis (hip extensors). 1. Lie on your abdomen on a firm surface. You can put a pillow under your hips if that is more comfortable. 2. Tense the muscles in your buttocks and lift your left / right leg about 4-6 inches (10-15 cm). Keep your knee straight as you lift your leg. 3. Hold this position for __________ seconds. 4. Slowly lower your leg to the starting position. 5. Let your leg relax completely after each repetition. Repeat __________ times. Complete this exercise __________ times a day. This information is not intended to replace advice given to you by your health care provider. Make sure you discuss any questions you have with your health care provider. Document Revised: 08/07/2018 Document Reviewed: 08/07/2018 Elsevier Patient Education  2020 [employer]  Iliotibial Band Syndrome Rehab Ask your health care provider which exercises are safe for you. Do exercises exactly as told by your health care provider and adjust them as directed. It is normal to feel mild stretching, pulling, tightness, or discomfort as you do these exercises. Stop right away if you feel sudden pain or your pain gets significantly worse. Do not begin these exercises until told by your health care provider. Stretching and range-of-motion exercises These exercises warm up your muscles and joints and improve the movement and flexibility of your hip and pelvis. Quadriceps stretch, prone  6. Lie on your abdomen on a firm surface, such as a bed or padded floor (prone position). 7. Bend your left / right knee and reach back to hold your ankle or pant leg. If you cannot reach your ankle or pant leg, loop a belt around your foot and grab the belt instead. 8. Gently pull your heel toward your buttocks. Your knee should not slide out to the side. You should feel a stretch in the front of your thigh and knee (quadriceps). 9. Hold this position for __________ seconds. Repeat __________ times. Complete this exercise __________ times a day. Iliotibial band stretch An iliotibial band is a strong band of muscle tissue that runs from the outer side of your hip to the outer side of your thigh and knee. 5. Lie on your side with your left / right leg in the top position. 6. Bend both of your knees and grab your left / right ankle. Stretch out your bottom arm to help you balance. 7. Slowly bring your top knee back so your thigh goes behind your trunk. 8. Slowly lower your top leg toward the floor until you feel a gentle stretch on the outside of your left / right hip and thigh. If you do not feel a stretch and your knee will not fall farther, place the heel of your other foot on top of your knee and pull your knee down toward the floor with your foot. 9. Hold this position for __________  seconds. Repeat __________ times. Complete this exercise __________ times a day. Strengthening exercises These exercises build strength and endurance in your hip and pelvis. Endurance is the ability to use your muscles for a long time, even after they get tired. Straight leg raises, side-lying This exercise strengthens the muscles that rotate the leg at the hip and move it away from your body (hip abductors). 5. Lie on your side with your left / right leg in the top position. Lie so your head, shoulder, hip, and knee line up. You may bend your bottom knee to help you balance. 6. Roll your hips slightly forward so your hips are stacked directly over each other and your left / right knee is facing forward. 7. Tense the muscles in your outer thigh and lift your top leg 4-6 inches (10-15 cm). 8. Hold this position for __________ seconds. 9. Slowly return to the starting position. Let your muscles relax completely before doing another repetition. Repeat __________ times. Complete this exercise __________ times a day. Leg raises, prone This exercise strengthens the muscles that move the hips (hip extensors). 5. Lie on your abdomen on your bed or a firm surface. You can put a pillow under your hips if that is more comfortable for your lower back. 6. Bend your left / right knee so your foot is straight up in the air. 7. Squeeze your buttocks muscles and lift your left / right thigh  off the bed. Do not let your back arch. 8. Tense your thigh muscle as hard as you can without increasing any knee pain. 9. Hold this position for __________ seconds. 10. Slowly lower your leg to the starting position and allow it to relax completely. Repeat __________ times. Complete this exercise __________ times a day. Hip hike 5. Stand sideways on a bottom step. Stand on your left / right leg with your other foot unsupported next to the step. You can hold on to the railing or wall for balance if needed. 6. Keep your  knees straight and your torso square. Then lift your left / right hip up toward the ceiling. 7. Slowly let your left / right hip lower toward the floor, past the starting position. Your foot should get closer to the floor. Do not lean or bend your knees. Repeat __________ times. Complete this exercise __________ times a day. This information is not intended to replace advice given to you by your health care provider. Make sure you discuss any questions you have with your health care provider. Document Revised: 02/07/2019 Document Reviewed: 08/08/2018 Elsevier Patient Education  2020 [employer].

## 2020-07-02 NOTE — Progress Notes (Signed)
CBC and CMP WNL

## 2020-07-03 LAB — COMPLETE METABOLIC PANEL WITH GFR
AG Ratio: 1.7 (calc) (ref 1.0–2.5)
ALT: 10 U/L (ref 6–29)
AST: 15 U/L (ref 10–35)
Albumin: 4.2 g/dL (ref 3.6–5.1)
Alkaline phosphatase (APISO): 54 U/L (ref 37–153)
BUN: 11 mg/dL (ref 7–25)
CO2: 24 mmol/L (ref 20–32)
Calcium: 9.2 mg/dL (ref 8.6–10.4)
Chloride: 105 mmol/L (ref 98–110)
Creat: 0.73 mg/dL (ref 0.50–0.99)
GFR, Est African American: 104 mL/min/{1.73_m2} (ref >=60)
GFR, Est Non African American: 90 mL/min/{1.73_m2} (ref >=60)
Globulin: 2.5 g/dL (calc) (ref 1.9–3.7)
Glucose, Bld: 100 mg/dL — ABNORMAL HIGH (ref 65–99)
Potassium: 4.3 mmol/L (ref 3.5–5.3)
Sodium: 139 mmol/L (ref 135–146)
Total Bilirubin: 0.4 mg/dL (ref 0.2–1.2)
Total Protein: 6.7 g/dL (ref 6.1–8.1)

## 2020-07-03 LAB — DRUG MONITOR, PANEL 5, W/CONF, URINE
Amphetamines: NEGATIVE ng/mL (ref <500)
Barbiturates: NEGATIVE ng/mL (ref <300)
Benzodiazepines: NEGATIVE ng/mL (ref <100)
Cocaine Metabolite: NEGATIVE ng/mL (ref <150)
Creatinine: 137.7 mg/dL
Marijuana Metabolite: NEGATIVE ng/mL (ref <20)
Methadone Metabolite: NEGATIVE ng/mL (ref <100)
Opiates: NEGATIVE ng/mL (ref <100)
Oxidant: NEGATIVE ug/mL
Oxycodone: NEGATIVE ng/mL (ref <100)
pH: 5.8 (ref 4.5–9.0)

## 2020-07-03 LAB — CBC WITH DIFFERENTIAL/PLATELET
Absolute Monocytes: 356 cells/uL (ref 200–950)
Basophils Absolute: 33 cells/uL (ref 0–200)
Basophils Relative: 0.5 %
Eosinophils Absolute: 376 cells/uL (ref 15–500)
Eosinophils Relative: 5.7 %
HCT: 41.5 % (ref 35.0–45.0)
Hemoglobin: 13.8 g/dL (ref 11.7–15.5)
Lymphs Abs: 2178 cells/uL (ref 850–3900)
MCH: 27.3 pg (ref 27.0–33.0)
MCHC: 33.3 g/dL (ref 32.0–36.0)
MCV: 82.2 fL (ref 80.0–100.0)
MPV: 10.6 fL (ref 7.5–12.5)
Monocytes Relative: 5.4 %
Neutro Abs: 3656 cells/uL (ref 1500–7800)
Neutrophils Relative %: 55.4 %
Platelets: 255 10*3/uL (ref 140–400)
RBC: 5.05 10*6/uL (ref 3.80–5.10)
RDW: 12.6 % (ref 11.0–15.0)
Total Lymphocyte: 33 %
WBC: 6.6 10*3/uL (ref 3.8–10.8)

## 2020-07-03 LAB — DRUG MONITOR, TRAMADOL,QN, URINE
Desmethyltramadol: 3946 ng/mL — ABNORMAL HIGH (ref <100)
Tramadol: 9120 ng/mL — ABNORMAL HIGH (ref <100)

## 2020-07-03 LAB — DM TEMPLATE

## 2020-07-07 NOTE — Progress Notes (Signed)
UDS is consistent with treatment.

## 2020-08-19 ENCOUNTER — Other Ambulatory Visit: Payer: Self-pay | Admitting: Rheumatology

## 2020-08-19 NOTE — Telephone Encounter (Signed)
Last Visit: 07/01/2020 Next Visit: 12/29/2020 UDS: 07/01/2020 Narc Agreement: 07/01/2020 Labs: 07/01/2020 CBC and CMP WNL.   Last Fill Tramadol: 06/12/2020  Okay to refill Tramadol and Celebrex?

## 2020-10-08 ENCOUNTER — Other Ambulatory Visit: Payer: Self-pay | Admitting: Physician Assistant

## 2020-10-08 NOTE — Telephone Encounter (Signed)
Last Visit: 07/01/2020 Next Visit: 12/29/2020 UDS:07/01/2020 c/w Narc Agreement:07/01/2020   Last Fill: 08/19/2020   Okay to refill tramadol and celebrex?

## 2020-11-10 ENCOUNTER — Other Ambulatory Visit: Payer: Self-pay | Admitting: Physician Assistant

## 2020-11-10 NOTE — Telephone Encounter (Signed)
Last Visit: 07/01/2020 Next Visit: 12/29/2020 Labs: 07/01/2020, CBC and CMP WNL.  Current Dose per office note 07/01/2020, Celebrex 200 mg 1 capsule by mouth daily as needed DX: Fibromyalgia:  Okay to refill Celebrex?

## 2020-11-27 ENCOUNTER — Other Ambulatory Visit: Payer: Self-pay | Admitting: Physician Assistant

## 2020-11-27 NOTE — Telephone Encounter (Signed)
Last Visit: 07/01/2020 Next Visit: 12/29/2020 UDS: 07/01/2020, UDS is consistent with treatment.  Narc Agreement: 07/01/2020  Last Fill: 10/08/2020  Okay to refill Tramadol?

## 2020-12-11 ENCOUNTER — Other Ambulatory Visit: Payer: Self-pay | Admitting: Rheumatology

## 2020-12-11 NOTE — Telephone Encounter (Signed)
Last Visit: 07/01/2020 Next Visit: 12/29/2020 Labs: 07/01/2020, CBC and CMP WNL  Current Dose per office note 07/01/2020: Celebrex 200 mg 1 capsule by mouth daily as needed DX: Fibromyalgia:  Last Fill: 11/11/2020  Okay to refill per Dr. Corliss Skains

## 2020-12-15 NOTE — Progress Notes (Signed)
Office Visit Note  Patient: Marie [name]             Date of Birth: [birth year]           MRN: [id number]             PCP: Lupita Raider, MD Referring: Lupita Raider, MD Visit Date: 12/29/2020 Occupation: @GUAROCC @  Subjective:  Medication monitoring.   History of Present Illness: Marie [name] is a 60 y.o. female with a history of fibromyalgia and osteoarthritis. She is currently taking tramadol 50 mg and Celebrex 200 mg for fibromyalgia. She is tolerating the medications with no refills. She would like to refill these medications. She reports she has about the same amount of flares due to poor sleep hygiene and stress. Her flares include stiffness and soreness in her muscles throughout her whole body. She has pain in her knees, shoulders and hips. She notes she wakes up frequently throughout the night. Her trochanteric bursitis is still bothering her while she is sleeping at night and will occasionally wake her up. She has tried OTC melatonin but has significant drowsiness in the morning. She has been trying the exercises and stretches provided at the last visit at home. This has been helping. She exercises through walking.   She has been sore in her knees bilaterally with the right one being worse. She had her left knee replaced but still feels a pressure. She denies any locking, swelling or warmth in these joints. She occasionally uses Voltaren gel which helps occasionally.   CMP on 07/01/2020 showed glucose of 100, but was otherwise normal.   Activities of Daily Living:  Patient reports morning stiffness for 10 minutes.   Patient Reports nocturnal pain.  Difficulty dressing/grooming: Denies Difficulty climbing stairs: Denies Difficulty getting out of chair: Denies Difficulty using hands for taps, buttons, cutlery, and/or writing: Denies  Review of Systems  Constitutional: Negative for fatigue.  HENT: Negative for mouth sores, mouth dryness and nose dryness.   Eyes:  Negative for pain, itching and dryness.  Respiratory: Negative for shortness of breath and difficulty breathing.   Cardiovascular: Negative for chest pain and palpitations.  Gastrointestinal: Negative for blood in stool, constipation and diarrhea.  Endocrine: Negative for increased urination.  Genitourinary: Negative for difficulty urinating.  Musculoskeletal: Positive for arthralgias, joint pain, joint swelling, myalgias, morning stiffness, muscle tenderness and myalgias.  Skin: Negative for color change, rash and redness.  Allergic/Immunologic: Negative for susceptible to infections.  Neurological: Negative for dizziness, numbness, headaches, memory loss and weakness.  Hematological: Negative for bruising/bleeding tendency.  Psychiatric/Behavioral: Negative for confusion.    PMFS History:  Patient Active Problem List   Diagnosis Date Noted  . Fibromyalgia 01/02/2017  . Other fatigue 01/02/2017  . Primary insomnia 01/02/2017  . Primary osteoarthritis of both knees 01/02/2017  . History of diabetes mellitus 01/02/2017  . History of hyperlipidemia 01/02/2017  . Primary osteoarthritis of left knee 11/25/2014  . Diabetes mellitus type 2 in obese (HCC) 11/25/2014  . Severe obesity (BMI >= 40) (HCC) 11/25/2014  . Hypertension 11/25/2014  . S/P total knee replacement using cement 11/25/2014  . RUQ PAIN 03/14/2007    Past Medical History:  Diagnosis Date  . Arthritis    knees, knees, neck, shoulders, hands   . Diabetes mellitus without complication (HCC)    borderline, states she is using Victoza for weight loss, Rx by PCP  . Fibromyalgia    followed by Dr. 03/16/2007  . Hypertension  Family History  Problem Relation Age of Onset  . Diabetes Mother   . Cancer Mother   . Diabetes Father    Past Surgical History:  Procedure Laterality Date  . ABDOMINAL HYSTERECTOMY  2000  . MYOMECTOMY     several surgeries for this problem  . TONSILLECTOMY    . TOTAL KNEE ARTHROPLASTY  Left 11/25/2014   dr whitfield  . TOTAL KNEE ARTHROPLASTY Left 11/25/2014   Procedure: TOTAL KNEE ARTHROPLASTY;  Surgeon: Valeria Batman, MD;  Location: [facility];  Service: Orthopedics;  Laterality: Left;   Social History   Social History Narrative  . Not on file   Immunization History  Administered Date(s) Administered  . PFIZER(Purple Top)SARS-COV-2 Vaccination 03/07/2020, 03/28/2020, 10/27/2020     Objective: Vital Signs: BP 120/83 (BP Location: Left Arm, Patient Position: Sitting, Cuff Size: Large)   Pulse 67   Resp 17   Ht 5\' 6"  (1.676 m)   Wt 270 lb 6.4 oz (122.7 kg)   BMI 43.64 kg/m    Physical Exam Constitutional:      Appearance: Normal appearance.  HENT:     Head: Normocephalic and atraumatic.  Cardiovascular:     Rate and Rhythm: Normal rate and regular rhythm.  Pulmonary:     Effort: Pulmonary effort is normal.  Abdominal:     Palpations: Abdomen is soft.  Skin:    General: Skin is warm and dry.  Neurological:     Mental Status: She is alert and oriented to person, place, and time.  Psychiatric:        Mood and Affect: Mood normal.      Musculoskeletal Exam: Good ROM of c-spine. Limited ROM of left shoulder during flexion, right is normal. Tension and tenderness to palpation of trapezius muscles bilaterally. Good ROM of her elbows, wrists, MCPs, PIPs and DIPs bilaterally with no signs of synovitis. No elbow contracture present. Able to form fist bilaterally. Good ROM of hips, knees, ankles and feet. No effusion or warmth present on knees.  Left knee joint is replaced.  Tenderness over trochanteric bursa bilaterally.   CDAI Exam: CDAI Score: - Patient Global: -; Provider Global: - Swollen: -; Tender: - Joint Exam 12/29/2020   No joint exam has been documented for this visit   There is currently no information documented on the homunculus. Go to the Rheumatology activity and complete the homunculus joint exam.  Investigation: No additional  findings.  Imaging: No results found.  Recent Labs: Lab Results  Component Value Date   WBC 6.6 07/01/2020   HGB 13.8 07/01/2020   PLT 255 07/01/2020   NA 139 07/01/2020   K 4.3 07/01/2020   CL 105 07/01/2020   CO2 24 07/01/2020   GLUCOSE 100 (H) 07/01/2020   BUN 11 07/01/2020   CREATININE 0.73 07/01/2020   BILITOT 0.4 07/01/2020   ALKPHOS 50 01/10/2017   AST 15 07/01/2020   ALT 10 07/01/2020   PROT 6.7 07/01/2020   ALBUMIN 4.1 01/10/2017   CALCIUM 9.2 07/01/2020   GFRAA 104 07/01/2020    Speciality Comments: No specialty comments available.  Procedures:  No procedures performed Allergies: Patient has no known allergies.   Assessment / Plan:     Visit Diagnoses: Fibromyalgia-according to the patient the pain is manageable with the medication regimen of tramadol 50 mg 1 to 2 tablets daily as needed for pain relief and Celebrex 200 mg 1 capsule by mouth daily as needed. She has some breakthrough pain but has been using  exercise and stretching to try and treat it non-pharmacologically. On exam she had tension and tenderness in her trapezius muscles bilaterally. I encouraged her to continue to exercise as much as she can tolerate to improve her symptoms. She declined PT at this time. Follow up in 6 months to update UDS and drug screen.  Medication monitoring encounter - Today we updated the UDS & narc agreement. The patient states she only takes the medication as prescribed.  Plan: DRUG MONITOR, TRAMADOL,QN, URINE, DRUG MONITOR, PANEL 5, W/CONF, URINE, CBC with Differential/Platelet, COMPLETE METABOLIC PANEL WITH GFR- ordered to monitor kidney and liver function.   Primary osteoarthritis of right knee- stable. She has occasional stiffness which she uses topical Voltaren gel . She denies any swelling or warmth. No sings of effusion seen on exam with good ROM. She has been doing knee exercises at home. She will call if she decides she wants to do PT. The importance of regular  exercise to preserve joints were discussed.   Status post total left knee replacement using cement- Stable. Denies any warmth or swelling in the joint.   Trochanteric bursitis of both hips- Stable. She only has symptoms at night when laying on her side. She will continue to stretch her IT band using the information provided at the last visit. I also recommended her to avoid prolonged sitting throughout the day and to sleep with a pillow-top mattress if her mattress is firm.   Other fatigue- The patient is having insomnia that may be associated with her bursitis pain. I advised her to increases stretching at night and provide support under her hips while sleeping.  Primary insomnia- She tried melatonin after her last visit but notes it caused significant drowsiness in the morning. She is going to restart melatonin again but at a starting dose of 5 mg and decrease further if she still has the drowsiness.  Vitamin D deficiency- stable.  History of hyperlipidemia  History of hypertension  History of diabetes mellitus  Orders: Orders Placed This Encounter  Procedures  . DRUG MONITOR, TRAMADOL,QN, URINE  . DRUG MONITOR, PANEL 5, W/CONF, URINE  . CBC with Differential/Platelet  . COMPLETE METABOLIC PANEL WITH GFR   No orders of the defined types were placed in this encounter.    Follow-Up Instructions: Return in about 6 months (around 07/01/2021) for FMS, OA.   Pollyann Savoy, MD  Note - This record has been created using Animal nutritionist.  Chart creation errors have been sought, but may not always  have been located. Such creation errors do not reflect on  the standard of medical care.

## 2020-12-25 ENCOUNTER — Other Ambulatory Visit: Payer: Self-pay | Admitting: Rheumatology

## 2020-12-25 NOTE — Telephone Encounter (Signed)
Last Visit: 07/01/1020 Next Visit: 12/29/2020 UDS: 07/01/2020 Narc Agreement: 07/01/2020  Last Fill: 11/27/2020  Okay to refill Tramadol?

## 2020-12-29 ENCOUNTER — Encounter: Payer: Self-pay | Admitting: Rheumatology

## 2020-12-29 ENCOUNTER — Other Ambulatory Visit: Payer: Self-pay

## 2020-12-29 ENCOUNTER — Ambulatory Visit: Payer: 59 | Admitting: Rheumatology

## 2020-12-29 VITALS — BP 120/83 | HR 67 | Resp 17 | Ht 66.0 in | Wt 270.4 lb

## 2020-12-29 DIAGNOSIS — Z8639 Personal history of other endocrine, nutritional and metabolic disease: Secondary | ICD-10-CM

## 2020-12-29 DIAGNOSIS — Z5181 Encounter for therapeutic drug level monitoring: Secondary | ICD-10-CM

## 2020-12-29 DIAGNOSIS — M797 Fibromyalgia: Secondary | ICD-10-CM | POA: Diagnosis not present

## 2020-12-29 DIAGNOSIS — M1711 Unilateral primary osteoarthritis, right knee: Secondary | ICD-10-CM

## 2020-12-29 DIAGNOSIS — R5383 Other fatigue: Secondary | ICD-10-CM

## 2020-12-29 DIAGNOSIS — M7061 Trochanteric bursitis, right hip: Secondary | ICD-10-CM

## 2020-12-29 DIAGNOSIS — Z96652 Presence of left artificial knee joint: Secondary | ICD-10-CM | POA: Diagnosis not present

## 2020-12-29 DIAGNOSIS — Z8679 Personal history of other diseases of the circulatory system: Secondary | ICD-10-CM

## 2020-12-29 DIAGNOSIS — F5101 Primary insomnia: Secondary | ICD-10-CM

## 2020-12-29 DIAGNOSIS — M7062 Trochanteric bursitis, left hip: Secondary | ICD-10-CM

## 2020-12-29 DIAGNOSIS — E559 Vitamin D deficiency, unspecified: Secondary | ICD-10-CM

## 2020-12-30 NOTE — Progress Notes (Signed)
CBC and CMP are normal.  Glucose is elevated, probably not fasting sample.

## 2020-12-31 LAB — CBC WITH DIFFERENTIAL/PLATELET
Absolute Monocytes: 367 cells/uL (ref 200–950)
Basophils Absolute: 50 cells/uL (ref 0–200)
Basophils Relative: 0.7 %
Eosinophils Absolute: 317 cells/uL (ref 15–500)
Eosinophils Relative: 4.4 %
HCT: 40.8 % (ref 35.0–45.0)
Hemoglobin: 13.4 g/dL (ref 11.7–15.5)
Lymphs Abs: 2923 cells/uL (ref 850–3900)
MCH: 27.1 pg (ref 27.0–33.0)
MCHC: 32.8 g/dL (ref 32.0–36.0)
MCV: 82.6 fL (ref 80.0–100.0)
MPV: 11.2 fL (ref 7.5–12.5)
Monocytes Relative: 5.1 %
Neutro Abs: 3542 cells/uL (ref 1500–7800)
Neutrophils Relative %: 49.2 %
Platelets: 218 10*3/uL (ref 140–400)
RBC: 4.94 10*6/uL (ref 3.80–5.10)
RDW: 12.4 % (ref 11.0–15.0)
Total Lymphocyte: 40.6 %
WBC: 7.2 10*3/uL (ref 3.8–10.8)

## 2020-12-31 LAB — DRUG MONITOR, PANEL 5, W/CONF, URINE
Amphetamines: NEGATIVE ng/mL (ref <500)
Barbiturates: NEGATIVE ng/mL (ref <300)
Benzodiazepines: NEGATIVE ng/mL (ref <100)
Cocaine Metabolite: NEGATIVE ng/mL (ref <150)
Creatinine: >300 mg/dL
Marijuana Metabolite: NEGATIVE ng/mL (ref <20)
Methadone Metabolite: NEGATIVE ng/mL (ref <100)
Opiates: NEGATIVE ng/mL (ref <100)
Oxidant: NEGATIVE ug/mL
Oxycodone: NEGATIVE ng/mL (ref <100)
pH: 5.5 (ref 4.5–9.0)

## 2020-12-31 LAB — COMPLETE METABOLIC PANEL WITH GFR
AG Ratio: 1.5 (calc) (ref 1.0–2.5)
ALT: 9 U/L (ref 6–29)
AST: 14 U/L (ref 10–35)
Albumin: 4.1 g/dL (ref 3.6–5.1)
Alkaline phosphatase (APISO): 54 U/L (ref 37–153)
BUN: 17 mg/dL (ref 7–25)
CO2: 29 mmol/L (ref 20–32)
Calcium: 9.6 mg/dL (ref 8.6–10.4)
Chloride: 108 mmol/L (ref 98–110)
Creat: 0.73 mg/dL (ref 0.50–0.99)
GFR, Est African American: 104 mL/min/{1.73_m2} (ref >=60)
GFR, Est Non African American: 90 mL/min/{1.73_m2} (ref >=60)
Globulin: 2.8 g/dL (calc) (ref 1.9–3.7)
Glucose, Bld: 130 mg/dL — ABNORMAL HIGH (ref 65–99)
Potassium: 4.7 mmol/L (ref 3.5–5.3)
Sodium: 143 mmol/L (ref 135–146)
Total Bilirubin: 0.3 mg/dL (ref 0.2–1.2)
Total Protein: 6.9 g/dL (ref 6.1–8.1)

## 2020-12-31 LAB — DRUG MONITOR, TRAMADOL,QN, URINE
Desmethyltramadol: 8637 ng/mL — ABNORMAL HIGH (ref <100)
Tramadol: >10000 ng/mL — ABNORMAL HIGH (ref <100)

## 2020-12-31 LAB — DM TEMPLATE

## 2020-12-31 NOTE — Progress Notes (Signed)
UDS consistent with tramadol use.

## 2021-03-15 ENCOUNTER — Other Ambulatory Visit: Payer: Self-pay | Admitting: Rheumatology

## 2021-03-15 NOTE — Telephone Encounter (Signed)
Next Visit: 07/01/2021  Last Visit: 12/29/2020  Last Fill: 12/11/2020  DX: Fibromyalgia  Current Dose per office note 12/29/2020, Celebrex 200 mg 1 capsule by mouth daily as needed  Labs: 12/29/2020, CBC and CMP are normal. Glucose is elevated, probably not fasting sample.  Okay to refill Celebrex?

## 2021-04-20 ENCOUNTER — Other Ambulatory Visit: Payer: Self-pay | Admitting: Physician Assistant

## 2021-04-20 NOTE — Telephone Encounter (Signed)
Next Visit: 07/01/2021   Last Visit: 12/29/2020   Last Fill: 12/28/2020  DX: Fibromyalgia   Current Dose per office note 12/29/2020  UDS: 12/29/2020  Narc Agreement: 01/07/2021  Okay to refill Tramadol?

## 2021-06-09 ENCOUNTER — Other Ambulatory Visit: Payer: Self-pay | Admitting: Rheumatology

## 2021-06-09 NOTE — Telephone Encounter (Signed)
Next Visit: 07/01/2021   Last Visit: 12/29/2020   Last Fill: 03/15/2021  DX: Fibromyalgia   Current Dose per office note 12/29/2020, Celebrex 200 mg 1 capsule by mouth daily as needed   Labs: 12/29/2020, CBC and CMP are normal.  Glucose is elevated, probably not fasting sample.   Okay to refill Celebrex?

## 2021-06-17 NOTE — Progress Notes (Signed)
Office Visit Note  Patient: Marie [name]             Date of Birth: [birth year]           MRN: [id number]             PCP: Lupita Raider, MD Referring: Lupita Raider, MD Visit Date: 07/01/2021 Occupation: @GUAROCC @  Subjective:  Generalized pain   History of Present Illness: Marie [name] is a 60 y.o. female with history of fibromyalgia and osteoarthritis.  Patient reports that over the past couple of months she has been experiencing increased generalized myalgias and muscle tenderness.  She states that she has been under increased rest recently due to her daughter recently being diagnosed with MS center has been being in poor health.  She has been having to take tramadol 50 mg 1 tablet twice daily most days for pain relief as well as Celebrex 200 mg 1 capsule daily.  She has been having more breakthrough pain and occasionally has to take Tylenol for pain relief.  She continues to have persistent pain in the right knee joint but does not want to proceed with a knee replacement at this time.  She has occasional swelling in the left knee replacement but denies any increased discomfort at this time.  She denies any recent falls.  She states that her discomfort due to trochanter bursitis of both hips has improved.  She perform stretching exercises on occasion.  She has been having increased difficulty sleeping through the night due to nocturnal pain.  She has had increased fatigue secondary to insomnia.    Activities of Daily Living:  Patient reports morning stiffness for 1 hour.   Patient Reports nocturnal pain.  Difficulty dressing/grooming: Denies Difficulty climbing stairs: Reports Difficulty getting out of chair: Denies Difficulty using hands for taps, buttons, cutlery, and/or writing: Denies  Review of Systems  Constitutional:  Negative for fatigue.  HENT:  Negative for mouth sores, mouth dryness and nose dryness.   Eyes:  Negative for pain, itching and dryness.  Respiratory:   Negative for shortness of breath and difficulty breathing.   Cardiovascular:  Negative for chest pain and palpitations.  Gastrointestinal:  Negative for blood in stool, constipation and diarrhea.  Endocrine: Negative for increased urination.  Genitourinary:  Negative for difficulty urinating.  Musculoskeletal:  Positive for joint pain, joint pain, joint swelling and morning stiffness. Negative for myalgias, muscle tenderness and myalgias.  Skin:  Negative for color change, rash and redness.  Allergic/Immunologic: Negative for susceptible to infections.  Neurological:  Negative for dizziness, numbness, headaches, memory loss and weakness.  Hematological:  Negative for bruising/bleeding tendency.  Psychiatric/Behavioral:  Negative for confusion.    PMFS History:  Patient Active Problem List   Diagnosis Date Noted   Fibromyalgia 01/02/2017   Other fatigue 01/02/2017   Primary insomnia 01/02/2017   Primary osteoarthritis of both knees 01/02/2017   History of diabetes mellitus 01/02/2017   History of hyperlipidemia 01/02/2017   Primary osteoarthritis of left knee 11/25/2014   Diabetes mellitus type 2 in obese (HCC) 11/25/2014   Severe obesity (BMI >= 40) (HCC) 11/25/2014   Hypertension 11/25/2014   S/P total knee replacement using cement 11/25/2014   RUQ PAIN 03/14/2007    Past Medical History:  Diagnosis Date   Arthritis    knees, knees, neck, shoulders, hands    Diabetes mellitus without complication (HCC)    borderline, states she is using Victoza for weight loss, Rx by PCP  Fibromyalgia    followed by Dr. Corliss Skains   Hypertension     Family History  Problem Relation Age of Onset   Diabetes Mother    Cancer Mother    Diabetes Father    Multiple sclerosis Daughter    Past Surgical History:  Procedure Laterality Date   ABDOMINAL HYSTERECTOMY  2000   MYOMECTOMY     several surgeries for this problem   TONSILLECTOMY     TOTAL KNEE ARTHROPLASTY Left 11/25/2014   dr  whitfield   TOTAL KNEE ARTHROPLASTY Left 11/25/2014   Procedure: TOTAL KNEE ARTHROPLASTY;  Surgeon: Valeria Batman, MD;  Location: [facility] OR;  Service: Orthopedics;  Laterality: Left;   Social History   Social History Narrative   Not on file   Immunization History  Administered Date(s) Administered   PFIZER(Purple Top)SARS-COV-2 Vaccination 03/07/2020, 03/28/2020, 10/27/2020     Objective: Vital Signs: BP (!) 143/89 (BP Location: Left Arm, Patient Position: Sitting, Cuff Size: Normal)   Pulse 65   Ht 5\' 6"  (1.676 m)   Wt 262 lb 4.8 oz (119 kg)   BMI 42.34 kg/m    Physical Exam Vitals and nursing note reviewed.  Constitutional:      Appearance: She is well-developed.  HENT:     Head: Normocephalic and atraumatic.  Eyes:     Conjunctiva/sclera: Conjunctivae normal.  Pulmonary:     Effort: Pulmonary effort is normal.  Abdominal:     Palpations: Abdomen is soft.  Musculoskeletal:     Cervical back: Normal range of motion.  Skin:    General: Skin is warm and dry.     Capillary Refill: Capillary refill takes less than 2 seconds.  Neurological:     Mental Status: She is alert and oriented to person, place, and time.  Psychiatric:        Behavior: Behavior normal.     Musculoskeletal Exam: C-spine has slightly limited range of motion with lateral rotation.  Good flexion extension of the C-spine noted.  Trapezius muscle tension and tenderness noted bilaterally.  Thoracic and lumbar spine have good range of motion.  Shoulder joints, elbow joints, wrist joints, MCPs, PIPs, DIPs have good range of motion with no synovitis.  Complete fist formation bilaterally.  PIP and DIP thickening consistent with osteoarthritis of both hands.  Hip joints have good ROM with no discomfort. Mild tenderness over trochanteric bursa bilaterally.  Left hip replacement has good ROM with no discomfort.  Right knee joint has painful ROM.  No warmth or effusion of knee joints.  Ankle joints have good ROM with  no tenderness or joint swelling.   CDAI Exam: CDAI Score: -- Patient Global: --; Provider Global: -- Swollen: --; Tender: -- Joint Exam 07/01/2021   No joint exam has been documented for this visit   There is currently no information documented on the homunculus. Go to the Rheumatology activity and complete the homunculus joint exam.  Investigation: No additional findings.  Imaging: No results found.  Recent Labs: Lab Results  Component Value Date   WBC 7.2 12/29/2020   HGB 13.4 12/29/2020   PLT 218 12/29/2020   NA 143 12/29/2020   K 4.7 12/29/2020   CL 108 12/29/2020   CO2 29 12/29/2020   GLUCOSE 130 (H) 12/29/2020   BUN 17 12/29/2020   CREATININE 0.73 12/29/2020   BILITOT 0.3 12/29/2020   ALKPHOS 50 01/10/2017   AST 14 12/29/2020   ALT 9 12/29/2020   PROT 6.9 12/29/2020   ALBUMIN 4.1  01/10/2017   CALCIUM 9.6 12/29/2020   GFRAA 104 12/29/2020    Speciality Comments: No specialty comments available.  Procedures:  No procedures performed Allergies: Patient has no known allergies.   Assessment / Plan:     Visit Diagnoses: Fibromyalgia -She has generalized hyperalgesia and positive tender points on exam.  She has been experiencing increased myalgias and muscle tenderness over the past couple of months due to fibromyalgia flares.  She has been under increased stress recently since her daughter was diagnosed with MS in April and her husband is in poor health.  We discussed the importance of stress management and self-care.  We also discussed the importance of good sleep hygiene and regular exercise.  She was advised to avoid bluelight 30 to 60 minutes prior to bedtime and to try to go to bed at the same time every night.  She can also try using essential oils for aromatherapy as well as sleepy time tea at bedtime.  For pain relief she has been having to take tramadol 50 mg 1 tablet twice daily as well as Celebrex 200 mg 1 capsule daily.  She has been having increased  breakthrough pain and occasionally has to take Tylenol.  We discussed the importance of stretching and exercising on a daily basis.  She will follow-up in the office in 6 months.  Medication monitoring encounter - UDS and narcotic agreement: Updated today on 07/01/2021.  CBC and CMP were also drawn today to monitor for drug toxicity.- Plan: DRUG MONITOR, TRAMADOL,QN, URINE, DRUG MONITOR, PANEL 5, W/CONF, URINE, CBC with Differential/Platelet, COMPLETE METABOLIC PANEL WITH GFR  Primary osteoarthritis of right knee: She has painful range of motion of the right knee joint on examination today.  No warmth or effusion was noted.  She was given a list of natural anti-inflammatories to start taking including turmeric, tart cherry, ginger, and omega-3.  Status post total left knee replacement using cement: Doing well.  She has good range of motion with no discomfort.  No warmth or effusion was noted on examination today.  Discussed the importance of lower extremity muscle strengthening.  Trochanteric bursitis of both hips: She has mild tenderness over bilateral trochanteric bursa.  Overall her discomfort has been tolerable.  She was strongly encouraged to perform stretching exercises on a daily basis.  Other fatigue: She has been experiencing increased fatigue secondary to insomnia.  We discussed the importance of regular exercise and good sleep hygiene.  Primary insomnia  Other medical conditions are listed as follows:  History of hyperlipidemia  Vitamin D deficiency  History of diabetes mellitus  History of hypertension  Orders: Orders Placed This Encounter  Procedures   DRUG MONITOR, TRAMADOL,QN, URINE   DRUG MONITOR, PANEL 5, W/CONF, URINE   CBC with Differential/Platelet   COMPLETE METABOLIC PANEL WITH GFR   No orders of the defined types were placed in this encounter.     Follow-Up Instructions: Return in about 6 months (around 12/29/2021) for Fibromyalgia, Osteoarthritis.   Gearldine Bienenstock, PA-C  Note - This record has been created using Dragon software.  Chart creation errors have been sought, but may not always  have been located. Such creation errors do not reflect on  the standard of medical care.

## 2021-06-21 ENCOUNTER — Other Ambulatory Visit: Payer: Self-pay | Admitting: Rheumatology

## 2021-06-21 NOTE — Telephone Encounter (Signed)
Next Visit: 07/01/2021   Last Visit: 12/29/2020   Last Fill:04/20/2021   DX: Fibromyalgia   Current Dose per office note 12/29/2020   UDS: 12/29/2020   Narc Agreement: 01/07/2021   Okay to refill Tramadol?

## 2021-07-01 ENCOUNTER — Encounter: Payer: Self-pay | Admitting: Physician Assistant

## 2021-07-01 ENCOUNTER — Ambulatory Visit: Payer: 59 | Admitting: Physician Assistant

## 2021-07-01 ENCOUNTER — Other Ambulatory Visit: Payer: Self-pay

## 2021-07-01 VITALS — BP 143/89 | HR 65 | Ht 66.0 in | Wt 262.3 lb

## 2021-07-01 DIAGNOSIS — Z96652 Presence of left artificial knee joint: Secondary | ICD-10-CM

## 2021-07-01 DIAGNOSIS — M1711 Unilateral primary osteoarthritis, right knee: Secondary | ICD-10-CM

## 2021-07-01 DIAGNOSIS — E559 Vitamin D deficiency, unspecified: Secondary | ICD-10-CM

## 2021-07-01 DIAGNOSIS — Z8639 Personal history of other endocrine, nutritional and metabolic disease: Secondary | ICD-10-CM

## 2021-07-01 DIAGNOSIS — Z5181 Encounter for therapeutic drug level monitoring: Secondary | ICD-10-CM | POA: Diagnosis not present

## 2021-07-01 DIAGNOSIS — M7062 Trochanteric bursitis, left hip: Secondary | ICD-10-CM

## 2021-07-01 DIAGNOSIS — R5383 Other fatigue: Secondary | ICD-10-CM

## 2021-07-01 DIAGNOSIS — M7061 Trochanteric bursitis, right hip: Secondary | ICD-10-CM

## 2021-07-01 DIAGNOSIS — M797 Fibromyalgia: Secondary | ICD-10-CM | POA: Diagnosis not present

## 2021-07-01 DIAGNOSIS — F5101 Primary insomnia: Secondary | ICD-10-CM

## 2021-07-01 DIAGNOSIS — Z8679 Personal history of other diseases of the circulatory system: Secondary | ICD-10-CM

## 2021-07-01 NOTE — Patient Instructions (Signed)
Hand Exercises Hand exercises can be helpful for almost anyone. These exercises can strengthen the hands, improve flexibility and movement, and increase blood flow to the hands. These results can make work and daily tasks easier. Hand exercises can be especially helpful for people who have joint pain from arthritis or have nerve damage from overuse (carpal tunnel syndrome). These exercises can also help people who have injured a hand. Exercises Most of these hand exercises are gentle stretching and motion exercises. It is usually safe to do them often throughout the day. Warming up your hands before exercise may help to reduce stiffness. You can do this with gentle massage or by placing your hands in warm water for 10-15 minutes. It is normal to feel some stretching, pulling, tightness, or mild discomfort as you begin new exercises. This will gradually improve. Stop an exercise right away if you feel sudden, severe pain or your pain gets worse. Ask your health care provider which exercises are best for you. Knuckle bend or "claw" fist  Stand or sit with your arm, hand, and all five fingers pointed straight up. Make sure to keep your wrist straight during the exercise. Gently bend your fingers down toward your palm until the tips of your fingers are touching the top of your palm. Keep your big knuckle straight and just bend the small knuckles in your fingers. Hold this position for __________ seconds. Straighten (extend) your fingers back to the starting position. Repeat this exercise 5-10 times with each hand. Full finger fist  Stand or sit with your arm, hand, and all five fingers pointed straight up. Make sure to keep your wrist straight during the exercise. Gently bend your fingers into your palm until the tips of your fingers are touching the middle of your palm. Hold this position for __________ seconds. Extend your fingers back to the starting position, stretching every joint fully. Repeat  this exercise 5-10 times with each hand. Straight fist Stand or sit with your arm, hand, and all five fingers pointed straight up. Make sure to keep your wrist straight during the exercise. Gently bend your fingers at the big knuckle, where your fingers meet your hand, and the middle knuckle. Keep the knuckle at the tips of your fingers straight and try to touch the bottom of your palm. Hold this position for __________ seconds. Extend your fingers back to the starting position, stretching every joint fully. Repeat this exercise 5-10 times with each hand. Tabletop  Stand or sit with your arm, hand, and all five fingers pointed straight up. Make sure to keep your wrist straight during the exercise. Gently bend your fingers at the big knuckle, where your fingers meet your hand, as far down as you can while keeping the small knuckles in your fingers straight. Think of forming a tabletop with your fingers. Hold this position for __________ seconds. Extend your fingers back to the starting position, stretching every joint fully. Repeat this exercise 5-10 times with each hand. Finger spread  Place your hand flat on a table with your palm facing down. Make sure your wrist stays straight as you do this exercise. Spread your fingers and thumb apart from each other as far as you can until you feel a gentle stretch. Hold this position for __________ seconds. Bring your fingers and thumb tight together again. Hold this position for __________ seconds. Repeat this exercise 5-10 times with each hand. Making circles  Stand or sit with your arm, hand, and all five fingers pointed   straight up. Make sure to keep your wrist straight during the exercise. Make a circle by touching the tip of your thumb to the tip of your index finger. Hold for __________ seconds. Then open your hand wide. Repeat this motion with your thumb and each finger on your hand. Repeat this exercise 5-10 times with each hand. Thumb  motion  Sit with your forearm resting on a table and your wrist straight. Your thumb should be facing up toward the ceiling. Keep your fingers relaxed as you move your thumb. Lift your thumb up as high as you can toward the ceiling. Hold for __________ seconds. Bend your thumb across your palm as far as you can, reaching the tip of your thumb for the small finger (pinkie) side of your palm. Hold for __________ seconds. Repeat this exercise 5-10 times with each hand. Grip strengthening  Hold a stress ball or other soft ball in the middle of your hand. Slowly increase the pressure, squeezing the ball as much as you can without causing pain. Think of bringing the tips of your fingers into the middle of your palm. All of your finger joints should bend when doing this exercise. Hold your squeeze for __________ seconds, then relax. Repeat this exercise 5-10 times with each hand. Contact a health care provider if: Your hand pain or discomfort gets much worse when you do an exercise. Your hand pain or discomfort does not improve within 2 hours after you exercise. If you have any of these problems, stop doing these exercises right away. Do not do them again unless your health care provider says that you can. Get help right away if: You develop sudden, severe hand pain or swelling. If this happens, stop doing these exercises right away. Do not do them again unless your health care provider says that you can. This information is not intended to replace advice given to you by your health care provider. Make sure you discuss any questions you have with your health care provider. Document Revised: 02/04/2021 Document Reviewed: 02/04/2021 Elsevier Patient Education  2022 [employer]  

## 2021-07-02 LAB — DM TEMPLATE

## 2021-07-02 LAB — DRUG MONITOR, PANEL 5, W/CONF, URINE

## 2021-07-02 NOTE — Progress Notes (Signed)
CMP WNL. CBC stable.

## 2021-07-04 LAB — COMPLETE METABOLIC PANEL WITH GFR
AG Ratio: 1.8 (calc) (ref 1.0–2.5)
ALT: 9 U/L (ref 6–29)
AST: 12 U/L (ref 10–35)
Albumin: 4.4 g/dL (ref 3.6–5.1)
Alkaline phosphatase (APISO): 54 U/L (ref 37–153)
BUN: 18 mg/dL (ref 7–25)
CO2: 28 mmol/L (ref 20–32)
Calcium: 9.7 mg/dL (ref 8.6–10.4)
Chloride: 106 mmol/L (ref 98–110)
Creat: 0.79 mg/dL (ref 0.50–1.05)
Globulin: 2.5 g/dL (calc) (ref 1.9–3.7)
Glucose, Bld: 94 mg/dL (ref 65–99)
Potassium: 4.5 mmol/L (ref 3.5–5.3)
Sodium: 140 mmol/L (ref 135–146)
Total Bilirubin: 0.3 mg/dL (ref 0.2–1.2)
Total Protein: 6.9 g/dL (ref 6.1–8.1)
eGFR: 85 mL/min/{1.73_m2} (ref >=60)

## 2021-07-04 LAB — CBC WITH DIFFERENTIAL/PLATELET
Absolute Monocytes: 441 cells/uL (ref 200–950)
Basophils Absolute: 53 cells/uL (ref 0–200)
Basophils Relative: 0.7 %
Eosinophils Absolute: 258 cells/uL (ref 15–500)
Eosinophils Relative: 3.4 %
HCT: 43.2 % (ref 35.0–45.0)
Hemoglobin: 13.8 g/dL (ref 11.7–15.5)
Lymphs Abs: 2721 cells/uL (ref 850–3900)
MCH: 26.4 pg — ABNORMAL LOW (ref 27.0–33.0)
MCHC: 31.9 g/dL — ABNORMAL LOW (ref 32.0–36.0)
MCV: 82.8 fL (ref 80.0–100.0)
MPV: 10.6 fL (ref 7.5–12.5)
Monocytes Relative: 5.8 %
Neutro Abs: 4127 cells/uL (ref 1500–7800)
Neutrophils Relative %: 54.3 %
Platelets: 254 10*3/uL (ref 140–400)
RBC: 5.22 10*6/uL — ABNORMAL HIGH (ref 3.80–5.10)
RDW: 12.8 % (ref 11.0–15.0)
Total Lymphocyte: 35.8 %
WBC: 7.6 10*3/uL (ref 3.8–10.8)

## 2021-07-04 LAB — DRUG MONITOR, PANEL 5, W/CONF, URINE
Amphetamines: NEGATIVE ng/mL (ref <500)
Barbiturates: NEGATIVE ng/mL (ref <300)
Benzodiazepines: NEGATIVE ng/mL (ref <100)
Cocaine Metabolite: NEGATIVE ng/mL (ref <150)
Creatinine: 81.6 mg/dL (ref >=20.0)
Marijuana Metabolite: NEGATIVE ng/mL (ref <20)
Methadone Metabolite: NEGATIVE ng/mL (ref <100)
Opiates: NEGATIVE ng/mL (ref <100)
Oxidant: NEGATIVE ug/mL (ref <200)
Oxycodone: NEGATIVE ng/mL (ref <100)
pH: 5.9 (ref 4.5–9.0)

## 2021-07-04 LAB — DM TEMPLATE

## 2021-07-04 LAB — DRUG MONITOR, TRAMADOL,QN, URINE
Desmethyltramadol: 181 ng/mL — ABNORMAL HIGH (ref <100)
Tramadol: 431 ng/mL — ABNORMAL HIGH (ref <100)

## 2021-07-05 NOTE — Progress Notes (Signed)
UDS is consistent with treatment.

## 2021-08-11 ENCOUNTER — Other Ambulatory Visit: Payer: Self-pay | Admitting: Physician Assistant

## 2021-08-11 NOTE — Telephone Encounter (Signed)
Next Visit: 12/30/2021  Last Visit: 07/01/2021,  tramadol 50 mg 1 tablet twice daily   UDS:07/01/2021, UDS is consistent with treatment   Narc Agreement: 07/01/2021  Last Fill: 06/21/2021  Okay to refill Tramadol?

## 2021-09-09 ENCOUNTER — Other Ambulatory Visit: Payer: Self-pay | Admitting: Physician Assistant

## 2021-09-09 NOTE — Telephone Encounter (Signed)
Next Visit: 12/30/2021  Last Visit: 07/01/2021  Last Fill: 06/09/2021  DX: Fibromyalgia   Current Dose per office note 07/01/2021: Celebrex 200 mg 1 capsule daily  Labs: 07/01/2021, CMP WNL.  CBC stable.   Okay to refill Celebrex?

## 2021-10-20 ENCOUNTER — Other Ambulatory Visit: Payer: Self-pay | Admitting: Rheumatology

## 2021-10-20 NOTE — Telephone Encounter (Signed)
Next Visit: 12/30/2021   Last Visit: 07/01/2021,  tramadol 50 mg 1 tablet twice daily    UDS:07/01/2021, UDS is consistent with treatment    Narc Agreement: 07/01/2021   Last Fill: 08/11/2021   Okay to refill Tramadol?

## 2021-12-13 ENCOUNTER — Other Ambulatory Visit: Payer: Self-pay | Admitting: Rheumatology

## 2021-12-14 NOTE — Telephone Encounter (Signed)
Next Visit: 12/30/2021   Last Visit: 07/01/2021,  tramadol 50 mg 1 tablet twice daily    UDS:07/01/2021, UDS is consistent with treatment    Narc Agreement: 07/01/2021   Last Fill: 10/20/2021   Okay to refill Tramadol?

## 2021-12-15 ENCOUNTER — Other Ambulatory Visit: Payer: Self-pay | Admitting: Physician Assistant

## 2021-12-15 NOTE — Telephone Encounter (Signed)
Next Visit: 12/30/2021  Last Visit: 07/01/2021  Last Fill: 06/09/2021  DX: Fibromyalgia   Current Dose per office note 07/01/2021: Celebrex 200 mg 1 capsule daily  Labs: 07/01/2021, CMP WNL.  CBC stable.   Okay to refill Celebrex?  

## 2021-12-16 NOTE — Progress Notes (Signed)
Office Visit Note  Patient: Marie [name]             Date of Birth: 24-Jan-1961           MRN: [id number]             PCP: Mayra Neer, MD Referring: Mayra Neer, MD Visit Date: 12/30/2021 Occupation: @GUAROCC @  Subjective:  Medication management  History of Present Illness: Marie [name] is a 61 y.o. female with history of osteoarthritis and fibromyalgia.  She states she continues to have some generalized pain and discomfort from osteoarthritis and fibromyalgia.  She is off-and-on pain in her right knee joint.  Left total knee replacement is doing well.  She has been taking Celebrex 200 mg daily and tramadol 50 mg 1 tablet twice daily as needed.  She denies any side effects from the medications.  She has off-and-on discomfort in the trochanteric bursa.  She states that insomnia and fatigue has improved.  Activities of Daily Living:  Patient reports morning stiffness for 30 minutes.   Patient Reports nocturnal pain.  Difficulty dressing/grooming: Denies Difficulty climbing stairs: Reports Difficulty getting out of chair: Reports Difficulty using hands for taps, buttons, cutlery, and/or writing: Denies  Review of Systems  Constitutional:  Positive for fatigue.  HENT:  Negative for mouth sores, mouth dryness and nose dryness.   Eyes:  Positive for dryness. Negative for pain and itching.  Respiratory:  Negative for shortness of breath and difficulty breathing.   Cardiovascular:  Negative for chest pain and palpitations.  Gastrointestinal:  Negative for blood in stool, constipation and diarrhea.  Endocrine: Negative for increased urination.  Genitourinary:  Negative for difficulty urinating.  Musculoskeletal:  Positive for joint pain, joint pain, joint swelling, myalgias, morning stiffness, muscle tenderness and myalgias.  Skin:  Negative for color change, rash, redness and sensitivity to sunlight.  Allergic/Immunologic: Negative for susceptible to infections.   Neurological:  Positive for parasthesias and weakness. Negative for dizziness, headaches and memory loss.  Hematological:  Negative for bruising/bleeding tendency.  Psychiatric/Behavioral:  Negative for confusion.    PMFS History:  Patient Active Problem List   Diagnosis Date Noted   Fibromyalgia 01/02/2017   Other fatigue 01/02/2017   Primary insomnia 01/02/2017   Primary osteoarthritis of both knees 01/02/2017   History of diabetes mellitus 01/02/2017   History of hyperlipidemia 01/02/2017   Primary osteoarthritis of left knee 11/25/2014   Diabetes mellitus type 2 in obese ([place]) 11/25/2014   Severe obesity (BMI >= 40) ([place]) 11/25/2014   Hypertension 11/25/2014   S/P total knee replacement using cement 11/25/2014   RUQ PAIN 03/14/2007    Past Medical History:  Diagnosis Date   Arthritis    knees, knees, neck, shoulders, hands    Diabetes mellitus without complication ([place])    borderline, states she is using Victoza for weight loss, Rx by PCP   Fibromyalgia    followed by Dr. Estanislado Pandy   Hypertension     Family History  Problem Relation Age of Onset   Diabetes Mother    Cancer Mother    Diabetes Father    Multiple sclerosis Daughter    Past Surgical History:  Procedure Laterality Date   ABDOMINAL HYSTERECTOMY  2000   MYOMECTOMY     several surgeries for this problem   TONSILLECTOMY     TOTAL KNEE ARTHROPLASTY Left 11/25/2014   dr whitfield   TOTAL KNEE ARTHROPLASTY Left 11/25/2014   Procedure: TOTAL KNEE ARTHROPLASTY;  Surgeon: Garald Balding,  MD;  Location: [place];  Service: Orthopedics;  Laterality: Left;   Social History   Social History Narrative   Not on file   Immunization History  Administered Date(s) Administered   PFIZER(Purple Top)SARS-COV-2 Vaccination 03/07/2020, 03/28/2020, 10/27/2020     Objective: Vital Signs: BP 128/84 (BP Location: Right Arm, Patient Position: Sitting, Cuff Size: Large)    Pulse 66    Ht 5\' 10"  (1.778 m)    Wt 260 lb  (117.9 kg)    BMI 37.31 kg/m    Physical Exam Vitals and nursing note reviewed.  Constitutional:      Appearance: She is well-developed.  HENT:     Head: Normocephalic and atraumatic.  Eyes:     Conjunctiva/sclera: Conjunctivae normal.  Cardiovascular:     Rate and Rhythm: Normal rate and regular rhythm.     Heart sounds: Normal heart sounds.  Pulmonary:     Effort: Pulmonary effort is normal.     Breath sounds: Normal breath sounds.  Abdominal:     General: Bowel sounds are normal.     Palpations: Abdomen is soft.  Musculoskeletal:     Cervical back: Normal range of motion.  Lymphadenopathy:     Cervical: No cervical adenopathy.  Skin:    General: Skin is warm and dry.     Capillary Refill: Capillary refill takes less than 2 seconds.  Neurological:     Mental Status: She is alert and oriented to person, place, and time.  Psychiatric:        Behavior: Behavior normal.     Musculoskeletal Exam: C-spine was in good range of motion.  She has some stiffness with range of motion of her left shoulder joint.  Right shoulder joint was in full range of motion.  Elbow joints, wrist joints, MCPs PIPs and DIPs with good range of motion.  She had bilateral PIP and DIP thickening.  Left knee joint was replaced.  Right knee joint was in good range of motion.  She had no tenderness over ankles or MTPs.  She has some tenderness over bilateral trochanteric bursa.  CDAI Exam: CDAI Score: -- Patient Global: --; Provider Global: -- Swollen: --; Tender: -- Joint Exam 12/30/2021   No joint exam has been documented for this visit   There is currently no information documented on the homunculus. Go to the Rheumatology activity and complete the homunculus joint exam.  Investigation: No additional findings.  Imaging: No results found.  Recent Labs: Lab Results  Component Value Date   WBC 7.6 07/01/2021   HGB 13.8 07/01/2021   PLT 254 07/01/2021   NA 140 07/01/2021   K 4.5 07/01/2021    CL 106 07/01/2021   CO2 28 07/01/2021   GLUCOSE 94 07/01/2021   BUN 18 07/01/2021   CREATININE 0.79 07/01/2021   BILITOT 0.3 07/01/2021   ALKPHOS 50 01/10/2017   AST 12 07/01/2021   ALT 9 07/01/2021   PROT 6.9 07/01/2021   ALBUMIN 4.1 01/10/2017   CALCIUM 9.7 07/01/2021   GFRAA 104 12/29/2020    Speciality Comments: No specialty comments available.  Procedures:  No procedures performed Allergies: Patient has no known allergies.   Assessment / Plan:     Visit Diagnoses: Fibromyalgia -she continues to have some generalized pain and discomfort.  She states fibromyalgia flares off and on.  Currently she is doing fairly well.  She is currently on tramadol 50 mg 1 tablet twice daily as well as Celebrex 200 mg 1 capsule daily.  Side effects of both medications were reviewed again.  She denies any side effects currently.  Medication monitoring encounter - tramadol 50 mg 1 tablet twice daily as needed. UDS & narcotic agreement: 07/01/2021. - Plan: DRUG MONITOR, TRAMADOL,QN, URINE, DRUG MONITOR, PANEL 5, W/CONF, URINE, CBC with Differential/Platelet, COMPLETE METABOLIC PANEL WITH GFR today.  Primary osteoarthritis of right knee-she has some discomfort off and on.  Status post total left knee replacement using cement-doing well.  Trochanteric bursitis of both hips-she had intermittent discomfort.  IT band stretches were discussed.  Other fatigue-improved.  Primary insomnia-improved per patient.  Other medical problems are listed as follows:  History of hyperlipidemia  History of hypertension-blood pressure was normal today.  History of diabetes mellitus  Vitamin D deficiency-she is currently not taking vitamin D.  Use of calcium rich diet and vitamin D was discussed.  Regular exercise was emphasized.  Osteoporosis screening-we will schedule DEXA scan.  Postmenopausal-she is postmenopausal.  I will schedule DEXA scan to screen for osteoporosis.  Orders: Orders Placed This  Encounter  Procedures   DG Bone Density   DRUG MONITOR, TRAMADOL,QN, URINE   DRUG MONITOR, PANEL 5, W/CONF, URINE   CBC with Differential/Platelet   COMPLETE METABOLIC PANEL WITH GFR   No orders of the defined types were placed in this encounter.    Follow-Up Instructions: Return in about 6 months (around 07/02/2022) for Osteoarthritis.   Bo Merino, MD  Note - This record has been created using Editor, commissioning.  Chart creation errors have been sought, but may not always  have been located. Such creation errors do not reflect on  the standard of medical care.

## 2021-12-30 ENCOUNTER — Ambulatory Visit: Payer: 59 | Admitting: Rheumatology

## 2021-12-30 ENCOUNTER — Encounter: Payer: Self-pay | Admitting: Rheumatology

## 2021-12-30 ENCOUNTER — Other Ambulatory Visit: Payer: Self-pay

## 2021-12-30 VITALS — BP 128/84 | HR 66 | Ht 70.0 in | Wt 260.0 lb

## 2021-12-30 DIAGNOSIS — Z780 Asymptomatic menopausal state: Secondary | ICD-10-CM

## 2021-12-30 DIAGNOSIS — Z5181 Encounter for therapeutic drug level monitoring: Secondary | ICD-10-CM | POA: Diagnosis not present

## 2021-12-30 DIAGNOSIS — Z96652 Presence of left artificial knee joint: Secondary | ICD-10-CM | POA: Diagnosis not present

## 2021-12-30 DIAGNOSIS — E559 Vitamin D deficiency, unspecified: Secondary | ICD-10-CM

## 2021-12-30 DIAGNOSIS — M797 Fibromyalgia: Secondary | ICD-10-CM

## 2021-12-30 DIAGNOSIS — Z13820 Encounter for screening for osteoporosis: Secondary | ICD-10-CM

## 2021-12-30 DIAGNOSIS — F5101 Primary insomnia: Secondary | ICD-10-CM

## 2021-12-30 DIAGNOSIS — M1711 Unilateral primary osteoarthritis, right knee: Secondary | ICD-10-CM

## 2021-12-30 DIAGNOSIS — M7062 Trochanteric bursitis, left hip: Secondary | ICD-10-CM

## 2021-12-30 DIAGNOSIS — Z8679 Personal history of other diseases of the circulatory system: Secondary | ICD-10-CM

## 2021-12-30 DIAGNOSIS — Z8639 Personal history of other endocrine, nutritional and metabolic disease: Secondary | ICD-10-CM

## 2021-12-30 DIAGNOSIS — M7061 Trochanteric bursitis, right hip: Secondary | ICD-10-CM

## 2021-12-30 DIAGNOSIS — R5383 Other fatigue: Secondary | ICD-10-CM

## 2021-12-31 LAB — DM TEMPLATE

## 2021-12-31 NOTE — Progress Notes (Signed)
CBC and CMP are normal.

## 2022-01-01 LAB — DRUG MONITOR, PANEL 5, W/CONF, URINE
Amphetamines: NEGATIVE ng/mL (ref <500)
Barbiturates: NEGATIVE ng/mL (ref <300)
Benzodiazepines: NEGATIVE ng/mL (ref <100)
Cocaine Metabolite: NEGATIVE ng/mL (ref <150)
Creatinine: 252.9 mg/dL (ref >=20.0)
Marijuana Metabolite: NEGATIVE ng/mL (ref <20)
Methadone Metabolite: NEGATIVE ng/mL (ref <100)
Opiates: NEGATIVE ng/mL (ref <100)
Oxidant: NEGATIVE ug/mL (ref <200)
Oxycodone: NEGATIVE ng/mL (ref <100)
pH: 5.8 (ref 4.5–9.0)

## 2022-01-01 LAB — DRUG MONITOR, TRAMADOL,QN, URINE
Desmethyltramadol: 5184 ng/mL — ABNORMAL HIGH (ref <100)
Tramadol: >10000 ng/mL — ABNORMAL HIGH (ref <100)

## 2022-01-01 LAB — CBC WITH DIFFERENTIAL/PLATELET
Absolute Monocytes: 422 cells/uL (ref 200–950)
Basophils Absolute: 62 cells/uL (ref 0–200)
Basophils Relative: 1 %
Eosinophils Absolute: 298 cells/uL (ref 15–500)
Eosinophils Relative: 4.8 %
HCT: 41.7 % (ref 35.0–45.0)
Hemoglobin: 13.6 g/dL (ref 11.7–15.5)
Lymphs Abs: 2604 cells/uL (ref 850–3900)
MCH: 26.8 pg — ABNORMAL LOW (ref 27.0–33.0)
MCHC: 32.6 g/dL (ref 32.0–36.0)
MCV: 82.2 fL (ref 80.0–100.0)
MPV: 10.9 fL (ref 7.5–12.5)
Monocytes Relative: 6.8 %
Neutro Abs: 2815 cells/uL (ref 1500–7800)
Neutrophils Relative %: 45.4 %
Platelets: 222 10*3/uL (ref 140–400)
RBC: 5.07 10*6/uL (ref 3.80–5.10)
RDW: 12.5 % (ref 11.0–15.0)
Total Lymphocyte: 42 %
WBC: 6.2 10*3/uL (ref 3.8–10.8)

## 2022-01-01 LAB — COMPLETE METABOLIC PANEL WITH GFR
AG Ratio: 1.7 (calc) (ref 1.0–2.5)
ALT: 8 U/L (ref 6–29)
AST: 15 U/L (ref 10–35)
Albumin: 4.3 g/dL (ref 3.6–5.1)
Alkaline phosphatase (APISO): 68 U/L (ref 37–153)
BUN: 14 mg/dL (ref 7–25)
CO2: 27 mmol/L (ref 20–32)
Calcium: 9.7 mg/dL (ref 8.6–10.4)
Chloride: 108 mmol/L (ref 98–110)
Creat: 0.7 mg/dL (ref 0.50–1.05)
Globulin: 2.5 g/dL (calc) (ref 1.9–3.7)
Glucose, Bld: 99 mg/dL (ref 65–99)
Potassium: 4.4 mmol/L (ref 3.5–5.3)
Sodium: 142 mmol/L (ref 135–146)
Total Bilirubin: 0.4 mg/dL (ref 0.2–1.2)
Total Protein: 6.8 g/dL (ref 6.1–8.1)
eGFR: 98 mL/min/{1.73_m2} (ref >=60)

## 2022-01-01 LAB — DM TEMPLATE

## 2022-01-02 NOTE — Progress Notes (Signed)
UDS consistent with tramadol use.

## 2022-02-01 ENCOUNTER — Other Ambulatory Visit: Payer: Self-pay | Admitting: Rheumatology

## 2022-02-02 MED ORDER — TRAMADOL HCL 50 MG PO TABS: ORAL_TABLET | ORAL | 0 refills | Status: DC

## 2022-02-02 NOTE — Telephone Encounter (Signed)
Next Visit: 07/06/2022 ? ?Last Visit: 12/30/2021 ? ?UDS:12/30/2021 ? ?Narc Agreement: 12/30/2021 ? ?Last Fill: 12/14/2021 tramadol 50 mg 1 tablet twice daily as needed ? ?Okay to refill Tramadol?  ?

## 2022-02-02 NOTE — Addendum Note (Signed)
Addended by: Carole Binning on: 02/02/2022 02:07 PM ? ? Modules accepted: Orders ? ?

## 2022-03-26 ENCOUNTER — Other Ambulatory Visit: Payer: Self-pay | Admitting: Physician Assistant

## 2022-03-29 NOTE — Telephone Encounter (Signed)
Next Visit: 07/06/2022   Last Visit: 12/30/2021  Last Fill: 12/15/2021  DX: Fibromyalgia  Current Dose per office note 12/30/2021: Celebrex 200 mg 1 capsule daily  Labs: 12/30/2021 CBC and CMP are normal.  Okay to refill Celebrex?

## 2022-04-01 ENCOUNTER — Other Ambulatory Visit: Payer: Self-pay | Admitting: Rheumatology

## 2022-04-01 NOTE — Telephone Encounter (Signed)
Next Visit: 07/06/2022   Last Visit: 12/30/2021   UDS:12/30/2021   Narc Agreement: 12/30/2021   Last Fill: 02/02/2022 tramadol 50 mg 1 tablet twice daily as needed   Okay to refill Tramadol?

## 2022-06-01 ENCOUNTER — Other Ambulatory Visit: Payer: Self-pay | Admitting: Rheumatology

## 2022-06-01 NOTE — Telephone Encounter (Signed)
Next Visit: 07/06/2022  Last Visit: 12/30/2021  UDS: 12/30/2021  Narc Agreement: 12/30/2021  Last Fill: 04/03/2022 tramadol 50 mg 1 tablet twice daily as needed  Okay to refill Tramadol?

## 2022-06-22 NOTE — Progress Notes (Unsigned)
Office Visit Note  Patient: Marie [name]             Date of Birth: [birth year]           MRN: [id number]             PCP: Lupita Raider, MD Referring: Lupita Raider, MD Visit Date: 07/06/2022 Occupation: @GUAROCC @  Subjective:  Medication monitoring   History of Present Illness: CHRISTENA SUNDERLIN is a 61 y.o. female with history of fibromyalgia and osteoarthritis.  Patient denies any recent fibromyalgia flares.  She has occasional arthralgias and myalgias especially with weather changes.  She experiences intermittent pain and stiffness in both hands and both feet due to underlying osteoarthritis.  She denies any joint swelling at this time.  She takes celebrex 200 mg 1 capsule by mouth daily as needed and tramadol 50 mg 1 to 2 tablets by mouth daily as needed for pain relief.  Her symptoms have been well managed with the current treatment regimen.  She denies any new questions or concerns.     Activities of Daily Living:  Patient reports morning stiffness for 20-30 minutes.   Patient Reports nocturnal pain.  Difficulty dressing/grooming: Denies Difficulty climbing stairs: Denies Difficulty getting out of chair: Denies Difficulty using hands for taps, buttons, cutlery, and/or writing: Denies  Review of Systems  Constitutional:  Positive for fatigue.  HENT:  Negative for mouth sores and mouth dryness.   Eyes:  Negative for dryness.  Respiratory:  Negative for shortness of breath.   Cardiovascular:  Negative for chest pain and palpitations.  Gastrointestinal:  Negative for blood in stool, constipation and diarrhea.  Endocrine: Negative for increased urination.  Genitourinary:  Negative for involuntary urination.  Musculoskeletal:  Positive for joint pain, joint pain, joint swelling, myalgias, morning stiffness, muscle tenderness and myalgias. Negative for gait problem and muscle weakness.  Skin:  Positive for hair loss. Negative for color change, rash and sensitivity to  sunlight.  Allergic/Immunologic: Negative for susceptible to infections.  Neurological:  Negative for dizziness and headaches.  Hematological:  Negative for swollen glands.  Psychiatric/Behavioral:  Negative for depressed mood and sleep disturbance. The patient is not nervous/anxious.     PMFS History:  Patient Active Problem List   Diagnosis Date Noted   Fibromyalgia 01/02/2017   Other fatigue 01/02/2017   Primary insomnia 01/02/2017   Primary osteoarthritis of both knees 01/02/2017   History of diabetes mellitus 01/02/2017   History of hyperlipidemia 01/02/2017   Primary osteoarthritis of left knee 11/25/2014   Diabetes mellitus type 2 in obese (HCC) 11/25/2014   Severe obesity (BMI >= 40) (HCC) 11/25/2014   Hypertension 11/25/2014   S/P total knee replacement using cement 11/25/2014   RUQ PAIN 03/14/2007    Past Medical History:  Diagnosis Date   Arthritis    knees, knees, neck, shoulders, hands    Diabetes mellitus without complication (HCC)    borderline, states she is using Victoza for weight loss, Rx by PCP   Fibromyalgia    followed by Dr. 03/16/2007   Hypertension     Family History  Problem Relation Age of Onset   Diabetes Mother    Cancer Mother    Diabetes Father    Multiple sclerosis Daughter    Past Surgical History:  Procedure Laterality Date   ABDOMINAL HYSTERECTOMY  2000   MYOMECTOMY     several surgeries for this problem   TONSILLECTOMY     TOTAL KNEE ARTHROPLASTY Left 11/25/2014  dr whitfield   TOTAL KNEE ARTHROPLASTY Left 11/25/2014   Procedure: TOTAL KNEE ARTHROPLASTY;  Surgeon: Valeria Batman, MD;  Location: [facility] OR;  Service: Orthopedics;  Laterality: Left;   Social History   Social History Narrative   Not on file   Immunization History  Administered Date(s) Administered   PFIZER(Purple Top)SARS-COV-2 Vaccination 03/07/2020, 03/28/2020, 10/27/2020     Objective: Vital Signs: BP 128/84 (BP Location: Left Arm, Patient Position:  Sitting, Cuff Size: Large)   Pulse 72   Resp 18   Ht 5\' 8"  (1.727 m)   Wt 259 lb 12.8 oz (117.8 kg)   BMI 39.50 kg/m    Physical Exam Vitals and nursing note reviewed.  Constitutional:      Appearance: She is well-developed.  HENT:     Head: Normocephalic and atraumatic.  Eyes:     Conjunctiva/sclera: Conjunctivae normal.  Cardiovascular:     Rate and Rhythm: Normal rate and regular rhythm.     Heart sounds: Normal heart sounds.  Pulmonary:     Effort: Pulmonary effort is normal.     Breath sounds: Normal breath sounds.  Abdominal:     General: Bowel sounds are normal.     Palpations: Abdomen is soft.  Musculoskeletal:     Cervical back: Normal range of motion.  Skin:    General: Skin is warm and dry.     Capillary Refill: Capillary refill takes less than 2 seconds.  Neurological:     Mental Status: She is alert and oriented to person, place, and time.  Psychiatric:        Behavior: Behavior normal.      Musculoskeletal Exam: C-spine has good ROM. Trapezius muscle tension and tenderness bilaterally.  Postural thoracic kyphosis noted. No midline spinal tenderness. Shoulder joints, elbow joints, wrist joints, MCPs, PIPs, and DIPs good ROM with no synovitis.  PIP and DIP thickening consistent with osteoarthritis of both hands.  Complete fist formation bilaterally.  Hip joints have good ROM with no groin pain.  Left knee replacement has good ROM.  Right knee joint has good ROM with no warmth or effusion.  Ankle joints have good ROM with no tenderness or joint swelling.  Bunions noted bilaterally.  PIP and DIP thickening consistent with osteoarthritis of both hands. Dorsal spurs noted bilaterally.    CDAI Exam: CDAI Score: -- Patient Global: --; Provider Global: -- Swollen: --; Tender: -- Joint Exam 07/06/2022   No joint exam has been documented for this visit   There is currently no information documented on the homunculus. Go to the Rheumatology activity and complete the  homunculus joint exam.  Investigation: No additional findings.  Imaging: No results found.  Recent Labs: Lab Results  Component Value Date   WBC 6.2 12/30/2021   HGB 13.6 12/30/2021   PLT 222 12/30/2021   NA 142 12/30/2021   K 4.4 12/30/2021   CL 108 12/30/2021   CO2 27 12/30/2021   GLUCOSE 99 12/30/2021   BUN 14 12/30/2021   CREATININE 0.70 12/30/2021   BILITOT 0.4 12/30/2021   ALKPHOS 50 01/10/2017   AST 15 12/30/2021   ALT 8 12/30/2021   PROT 6.8 12/30/2021   ALBUMIN 4.1 01/10/2017   CALCIUM 9.7 12/30/2021   GFRAA 104 12/29/2020    Speciality Comments: No specialty comments available.  Procedures:  No procedures performed Allergies: Patient has no known allergies.   Assessment / Plan:     Visit Diagnoses: Fibromyalgia -She has not had any recent fibromyalgia flares.  She has ongoing trapezius muscle tension and tenderness bilaterally as well as trochanteric bursitis of both hips. Her myalgias and arthralgias are exacerbated by weather changes.  She is currently on tramadol 50 mg 1 tablet twice daily as well as Celebrex 200 mg 1 capsule daily for pain relief.  CBC, CMP, and UDS were updated today.   Discussed the importance of regular exercise and good sleep hygiene.  She will follow up in 6 months or sooner if needed.  Medication monitoring encounter - Tramadol 50 mg 1 tablet by mouth twice daily as needed. UDS & narcotic agreement: 12/30/2021.  She is due to update UDS and routine lab monitoring.  Orders for UDS, CBC, and CMP were released today.  - Plan: DRUG MONITOR, TRAMADOL,QN, URINE, DRUG MONITOR, PANEL 5, W/CONF, URINE, CBC with Differential/Platelet, COMPLETE METABOLIC PANEL WITH GFR  Primary osteoarthritis of right knee: She has good ROM of the right knee joint with no discomfort.  No warmth or effusion of the right knee noted.  Status post total left knee replacement using cement: Doing well.  She has good ROM with no discomfort.  No warmth or effusion.    Trochanteric bursitis of both hips: She has intermittent discomfort in both hips.  She has tenderness to palpation over bilateral trochanteric bursa.  Discussed the importance of performing stretching exercises daily.   Other fatigue: Stable. Discussed the importance of regular exercise and good sleep hygiene.   Primary insomnia: She continues to experience intermittent nocturnal pain which contributes to interrupted sleep at night.   Vitamin D deficiency - Order for DEXA placed today. Plan: DG BONE DENSITY (DXA)  Osteoporosis screening - Patient is postmenopausal and requires a baseline DEXA.  Order placed today.  Plan: DG BONE DENSITY (DXA)  Other medical conditions are listed as follows:   History of hypertension: BP was 128/84 today in the office.   History of hyperlipidemia  History of diabetes mellitus   Orders: Orders Placed This Encounter  Procedures   DG BONE DENSITY (DXA)   DRUG MONITOR, TRAMADOL,QN, URINE   DRUG MONITOR, PANEL 5, W/CONF, URINE   CBC with Differential/Platelet   COMPLETE METABOLIC PANEL WITH GFR   No orders of the defined types were placed in this encounter.    Follow-Up Instructions: Return for Fibromyalgia, Osteoarthritis.   Gearldine Bienenstock, PA-C  Note - This record has been created using Dragon software.  Chart creation errors have been sought, but may not always  have been located. Such creation errors do not reflect on  the standard of medical care.

## 2022-07-06 ENCOUNTER — Ambulatory Visit: Payer: 59 | Attending: Physician Assistant | Admitting: Physician Assistant

## 2022-07-06 ENCOUNTER — Encounter: Payer: Self-pay | Admitting: Physician Assistant

## 2022-07-06 VITALS — BP 128/84 | HR 72 | Resp 18 | Ht 68.0 in | Wt 259.8 lb

## 2022-07-06 DIAGNOSIS — M1711 Unilateral primary osteoarthritis, right knee: Secondary | ICD-10-CM

## 2022-07-06 DIAGNOSIS — M797 Fibromyalgia: Secondary | ICD-10-CM

## 2022-07-06 DIAGNOSIS — Z13820 Encounter for screening for osteoporosis: Secondary | ICD-10-CM

## 2022-07-06 DIAGNOSIS — E559 Vitamin D deficiency, unspecified: Secondary | ICD-10-CM

## 2022-07-06 DIAGNOSIS — Z5181 Encounter for therapeutic drug level monitoring: Secondary | ICD-10-CM

## 2022-07-06 DIAGNOSIS — M7062 Trochanteric bursitis, left hip: Secondary | ICD-10-CM

## 2022-07-06 DIAGNOSIS — Z8639 Personal history of other endocrine, nutritional and metabolic disease: Secondary | ICD-10-CM

## 2022-07-06 DIAGNOSIS — F5101 Primary insomnia: Secondary | ICD-10-CM

## 2022-07-06 DIAGNOSIS — R5383 Other fatigue: Secondary | ICD-10-CM

## 2022-07-06 DIAGNOSIS — Z96652 Presence of left artificial knee joint: Secondary | ICD-10-CM | POA: Diagnosis not present

## 2022-07-06 DIAGNOSIS — M7061 Trochanteric bursitis, right hip: Secondary | ICD-10-CM

## 2022-07-06 DIAGNOSIS — Z8679 Personal history of other diseases of the circulatory system: Secondary | ICD-10-CM

## 2022-07-06 NOTE — Progress Notes (Signed)
RBC count is borderline elevated. Rest of CBC WNL.

## 2022-07-07 NOTE — Progress Notes (Signed)
Glucose is elevated-123. Rest of CMP WNL.

## 2022-07-08 LAB — DRUG MONITOR, PANEL 5, W/CONF, URINE
Amphetamines: NEGATIVE ng/mL (ref <500)
Barbiturates: NEGATIVE ng/mL (ref <300)
Benzodiazepines: NEGATIVE ng/mL (ref <100)
Cocaine Metabolite: NEGATIVE ng/mL (ref <150)
Creatinine: 277.1 mg/dL (ref >=20.0)
Marijuana Metabolite: NEGATIVE ng/mL (ref <20)
Methadone Metabolite: NEGATIVE ng/mL (ref <100)
Opiates: NEGATIVE ng/mL (ref <100)
Oxidant: NEGATIVE ug/mL (ref <200)
Oxycodone: NEGATIVE ng/mL (ref <100)
pH: 5.4 (ref 4.5–9.0)

## 2022-07-08 LAB — COMPLETE METABOLIC PANEL WITH GFR
AG Ratio: 1.6 (calc) (ref 1.0–2.5)
ALT: 11 U/L (ref 6–29)
AST: 14 U/L (ref 10–35)
Albumin: 4.2 g/dL (ref 3.6–5.1)
Alkaline phosphatase (APISO): 67 U/L (ref 37–153)
BUN: 18 mg/dL (ref 7–25)
CO2: 26 mmol/L (ref 20–32)
Calcium: 9.8 mg/dL (ref 8.6–10.4)
Chloride: 106 mmol/L (ref 98–110)
Creat: 0.78 mg/dL (ref 0.50–1.05)
Globulin: 2.7 g/dL (calc) (ref 1.9–3.7)
Glucose, Bld: 123 mg/dL — ABNORMAL HIGH (ref 65–99)
Potassium: 4.6 mmol/L (ref 3.5–5.3)
Sodium: 141 mmol/L (ref 135–146)
Total Bilirubin: 0.4 mg/dL (ref 0.2–1.2)
Total Protein: 6.9 g/dL (ref 6.1–8.1)
eGFR: 86 mL/min/{1.73_m2} (ref >=60)

## 2022-07-08 LAB — CBC WITH DIFFERENTIAL/PLATELET
Absolute Monocytes: 358 cells/uL (ref 200–950)
Basophils Absolute: 51 cells/uL (ref 0–200)
Basophils Relative: 0.8 %
Eosinophils Absolute: 218 cells/uL (ref 15–500)
Eosinophils Relative: 3.4 %
HCT: 42.3 % (ref 35.0–45.0)
Hemoglobin: 14.1 g/dL (ref 11.7–15.5)
Lymphs Abs: 2298 cells/uL (ref 850–3900)
MCH: 27.1 pg (ref 27.0–33.0)
MCHC: 33.3 g/dL (ref 32.0–36.0)
MCV: 81.2 fL (ref 80.0–100.0)
MPV: 10.8 fL (ref 7.5–12.5)
Monocytes Relative: 5.6 %
Neutro Abs: 3475 cells/uL (ref 1500–7800)
Neutrophils Relative %: 54.3 %
Platelets: 251 10*3/uL (ref 140–400)
RBC: 5.21 10*6/uL — ABNORMAL HIGH (ref 3.80–5.10)
RDW: 12.7 % (ref 11.0–15.0)
Total Lymphocyte: 35.9 %
WBC: 6.4 10*3/uL (ref 3.8–10.8)

## 2022-07-08 LAB — DRUG MONITOR, TRAMADOL,QN, URINE
Desmethyltramadol: 6213 ng/mL — ABNORMAL HIGH (ref <100)
Tramadol: >10000 ng/mL — ABNORMAL HIGH (ref <100)

## 2022-07-08 LAB — DM TEMPLATE

## 2022-07-08 NOTE — Progress Notes (Signed)
UDS is consistent with treatment.

## 2022-07-17 ENCOUNTER — Other Ambulatory Visit: Payer: Self-pay | Admitting: Physician Assistant

## 2022-07-18 NOTE — Telephone Encounter (Signed)
Next Visit: 01/04/2023  Last Visit: 07/06/2022  Last Fill: 03/29/2022  DX: Fibromyalgia  Current Dose per office note 07/06/2022: Celebrex 200 mg 1 capsule daily for pain relief  Labs: 07/06/2022 Glucose is elevated-123. Rest of CMP WNL.   Okay to refill Celebrex?

## 2022-07-20 ENCOUNTER — Ambulatory Visit (HOSPITAL_BASED_OUTPATIENT_CLINIC_OR_DEPARTMENT_OTHER)
Admission: RE | Admit: 2022-07-20 | Discharge: 2022-07-20 | Disposition: A | Discharge status: Home / Self Care | Payer: 59 | Source: Ambulatory Visit | Attending: Physician Assistant | Admitting: Physician Assistant

## 2022-07-20 DIAGNOSIS — Z13820 Encounter for screening for osteoporosis: Secondary | ICD-10-CM | POA: Insufficient documentation

## 2022-07-20 DIAGNOSIS — E559 Vitamin D deficiency, unspecified: Secondary | ICD-10-CM | POA: Diagnosis present

## 2022-07-20 NOTE — Progress Notes (Signed)
DEXA WNL. Repeat in 5 years.

## 2022-08-10 ENCOUNTER — Other Ambulatory Visit: Payer: Self-pay | Admitting: Rheumatology

## 2022-08-11 NOTE — Telephone Encounter (Signed)
Next Visit: 01/04/2023   Last Visit: 07/06/2022   Last Fill: 06/01/2022 Tramadol 50 mg 1 tablet by mouth twice daily as needed.  DX: Fibromyalgia  UDS: 07/06/2022 UDS is consistent with treatment.   Narc Agreement: 07/06/2022  Okay to refill Tramadol?

## 2022-10-15 ENCOUNTER — Other Ambulatory Visit: Payer: Self-pay | Admitting: Physician Assistant

## 2022-10-17 NOTE — Telephone Encounter (Signed)
Next Visit: 01/04/2023   Last Visit: 07/06/2022   Last Fill: 07/18/2022   DX: Fibromyalgia   Current Dose per office note 07/06/2022: Celebrex 200 mg 1 capsule daily for pain relief   Labs: 07/06/2022 Glucose is elevated-123. Rest of CMP WNL.    Okay to refill Celebrex?

## 2022-12-13 ENCOUNTER — Other Ambulatory Visit: Payer: Self-pay | Admitting: Rheumatology

## 2022-12-14 NOTE — Telephone Encounter (Signed)
Next Visit: 01/04/2023  Last Visit: 9/6/29023  UDS:07/06/2022  UDS is consistent with treatment.   Narc Agreement: 07/06/2022  Last Fill: 08/11/2022 Tramadol 50 mg 1 tablet by mouth twice daily as needed.   Okay to refill Tramadol?

## 2022-12-21 NOTE — Progress Notes (Signed)
Office Visit Note  Patient: Marie [name]             Date of Birth: 1961-01-27           MRN: [id number]             PCP: Mayra Neer, MD Referring: Mayra Neer, MD Visit Date: 01/04/2023 Occupation: '@GUAROCC'$ @  Subjective:  Pain in both knees  History of Present Illness: Marie [name] is a 62 y.o. female with history of osteoarthritis, and fibromyalgia.  She states she continues to have pain and discomfort in her both knees and intermittent discomfort in her trochanteric bursa.  She has not noticed any joint swelling.  She continues to have some generalized pain from fibromyalgia.  She has been taking tramadol 50 mg p.o. daily or twice daily depending on the pain.  She continues to take Celebrex 200 mg p.o. daily.  She gives history of fatigue.    Activities of Daily Living:  Patient reports morning stiffness for 30 minutes.   Patient Reports nocturnal pain.  Difficulty dressing/grooming: Denies Difficulty climbing stairs: Reports Difficulty getting out of chair: Reports Difficulty using hands for taps, buttons, cutlery, and/or writing: Denies  Review of Systems  Constitutional: Negative.  Negative for fatigue.  HENT: Negative.  Negative for mouth sores and mouth dryness.   Eyes: Negative.  Negative for dryness.  Respiratory: Negative.  Negative for shortness of breath.   Cardiovascular: Negative.  Negative for chest pain and palpitations.  Gastrointestinal: Negative.  Negative for blood in stool, constipation and diarrhea.  Endocrine: Negative.  Negative for increased urination.  Genitourinary: Negative.  Negative for involuntary urination.  Musculoskeletal:  Positive for joint pain, joint pain, joint swelling, myalgias, morning stiffness, muscle tenderness and myalgias. Negative for gait problem and muscle weakness.  Skin: Negative.  Negative for color change, rash, hair loss and sensitivity to sunlight.  Allergic/Immunologic: Negative.  Negative for susceptible  to infections.  Neurological: Negative.  Negative for dizziness and headaches.  Hematological: Negative.  Negative for swollen glands.  Psychiatric/Behavioral:  Positive for sleep disturbance. Negative for depressed mood. The patient is not nervous/anxious.     PMFS History:  Patient Active Problem List   Diagnosis Date Noted   Fibromyalgia 01/02/2017   Other fatigue 01/02/2017   Primary insomnia 01/02/2017   Primary osteoarthritis of both knees 01/02/2017   History of diabetes mellitus 01/02/2017   History of hyperlipidemia 01/02/2017   Primary osteoarthritis of left knee 11/25/2014   Diabetes mellitus type 2 in obese ([place]) 11/25/2014   Severe obesity (BMI >= 40) ([place]) 11/25/2014   Hypertension 11/25/2014   S/P total knee replacement using cement 11/25/2014   RUQ PAIN 03/14/2007    Past Medical History:  Diagnosis Date   Arthritis    knees, knees, neck, shoulders, hands    Diabetes mellitus without complication (HCC)    borderline, states she is using Victoza for weight loss, Rx by PCP   Fibromyalgia    followed by Dr. Estanislado Pandy   Hypertension     Family History  Problem Relation Age of Onset   Diabetes Mother    Cancer Mother    Diabetes Father    Multiple sclerosis Daughter    Past Surgical History:  Procedure Laterality Date   ABDOMINAL HYSTERECTOMY  2000   MYOMECTOMY     several surgeries for this problem   TONSILLECTOMY     TOTAL KNEE ARTHROPLASTY Left 11/25/2014   dr whitfield   TOTAL KNEE ARTHROPLASTY Left  11/25/2014   Procedure: TOTAL KNEE ARTHROPLASTY;  Surgeon: Garald Balding, MD;  Location: [facility];  Service: Orthopedics;  Laterality: Left;   Social History   Social History Narrative   Not on file   Immunization History  Administered Date(s) Administered   PFIZER(Purple Top)SARS-COV-2 Vaccination 03/07/2020, 03/28/2020, 10/27/2020     Objective: Vital Signs: BP 135/80 (BP Location: Left Arm, Patient Position: Sitting, Cuff Size: Large)   Pulse  72   Resp 17   Ht '5\' 8"'$  (1.727 m)   Wt 259 lb (117.5 kg)   BMI 39.38 kg/m    Physical Exam Vitals and nursing note reviewed.  Constitutional:      Appearance: She is well-developed.  HENT:     Head: Normocephalic and atraumatic.  Eyes:     Conjunctiva/sclera: Conjunctivae normal.  Cardiovascular:     Rate and Rhythm: Normal rate and regular rhythm.     Heart sounds: Normal heart sounds.  Pulmonary:     Effort: Pulmonary effort is normal.     Breath sounds: Normal breath sounds.  Abdominal:     General: Bowel sounds are normal.     Palpations: Abdomen is soft.  Musculoskeletal:     Cervical back: Normal range of motion.  Lymphadenopathy:     Cervical: No cervical adenopathy.  Skin:    General: Skin is warm and dry.     Capillary Refill: Capillary refill takes less than 2 seconds.  Neurological:     Mental Status: She is alert and oriented to person, place, and time.  Psychiatric:        Behavior: Behavior normal.      Musculoskeletal Exam: Cervical spine was in good range of motion.  She had bilateral trapezius spasm.  She had postural kyphosis.  There was no tenderness over lumbar spine.  Shoulders, elbows, wrist joints with good range of motion.  She had bilateral PIP and DIP thickening with no synovitis.  Hip joints and knee joints were in good range of motion.  Left knee joint was replaced.  There was no tenderness over ankles or MTPs.  CDAI Exam: CDAI Score: -- Patient Global: --; Provider Global: -- Swollen: --; Tender: -- Joint Exam 01/04/2023   No joint exam has been documented for this visit   There is currently no information documented on the homunculus. Go to the Rheumatology activity and complete the homunculus joint exam.  Investigation: No additional findings.  Imaging: No results found.  Recent Labs: Lab Results  Component Value Date   WBC 6.4 07/06/2022   HGB 14.1 07/06/2022   PLT 251 07/06/2022   NA 141 07/06/2022   K 4.6 07/06/2022    CL 106 07/06/2022   CO2 26 07/06/2022   GLUCOSE 123 (H) 07/06/2022   BUN 18 07/06/2022   CREATININE 0.78 07/06/2022   BILITOT 0.4 07/06/2022   ALKPHOS 50 01/10/2017   AST 14 07/06/2022   ALT 11 07/06/2022   PROT 6.9 07/06/2022   ALBUMIN 4.1 01/10/2017   CALCIUM 9.8 07/06/2022   GFRAA 104 12/29/2020    Speciality Comments: No specialty comments available.  Procedures:  No procedures performed Allergies: Patient has no known allergies.   Assessment / Plan:     Visit Diagnoses: Fibromyalgia -she continues to have generalized pain and discomfort from fibromyalgia.  She had positive tender points and trapezius spasm.  She states her symptoms are manageable on current combination of tramadol and Celebrex.  She is currently on tramadol 50 mg 1 tablet twice  daily as well as Celebrex 200 mg 1 capsule daily for pain relief.  Side effects of Celebrex including risk of GI bleed, hepatotoxicity and renal toxicity were discussed.  Other fatigue-related to insomnia and fibromyalgia.  Primary insomnia-good sleep hygiene was discussed.  Medication monitoring encounter -she is on tramadol 50 mg 1 tablet by mouth twice daily as needed.  She is unable to taper tramadol.  Side effects of tramadol use were reviewed.  UDS & narcotic agreement: - Plan: CBC with Differential/Platelet, COMPLETE METABOLIC PANEL WITH GFR, DRUG MONITOR, TRAMADOL,QN, URINE, DRUG MONITOR, PANEL 5, W/CONF, URINE  Primary osteoarthritis of right knee-she continues to have some discomfort in her knee joints.  No warmth swelling or effusion was noted.  A handout on knee exercises was given.  Status post total left knee replacement using cement-she had good range of motion and  Trochanteric bursitis of both hips-she has intermittent pain.  Osteoporosis screening - July 20, 2022 the BMD measured at [facility] Right is 0.972 g/cm2 with aT-score of -0.5.  Use of calcium rich diet and exercise was emphasized.  Vitamin D  deficiency -patient has history of vitamin D deficiency.  Plan: VITAMIN D 25 Hydroxy (Vit-D Deficiency, Fractures)  History of hypertension-blood pressure was normal today.  Other medical problems listed as follows:  History of hyperlipidemia  History of diabetes mellitus  Orders: Orders Placed This Encounter  Procedures   CBC with Differential/Platelet   COMPLETE METABOLIC PANEL WITH GFR   DRUG MONITOR, TRAMADOL,QN, URINE   DRUG MONITOR, PANEL 5, W/CONF, URINE   VITAMIN D 25 Hydroxy (Vit-D Deficiency, Fractures)   No orders of the defined types were placed in this encounter.   Follow-Up Instructions: Return in about 6 months (around 07/07/2023) for Osteoarthritis, FMS.   Bo Merino, MD  Note - This record has been created using Editor, commissioning.  Chart creation errors have been sought, but may not always  have been located. Such creation errors do not reflect on  the standard of medical care.

## 2023-01-04 ENCOUNTER — Ambulatory Visit: Payer: 59 | Attending: Rheumatology | Admitting: Rheumatology

## 2023-01-04 ENCOUNTER — Encounter: Payer: Self-pay | Admitting: Rheumatology

## 2023-01-04 VITALS — BP 135/80 | HR 72 | Resp 17 | Ht 68.0 in | Wt 259.0 lb

## 2023-01-04 DIAGNOSIS — M7061 Trochanteric bursitis, right hip: Secondary | ICD-10-CM

## 2023-01-04 DIAGNOSIS — R5383 Other fatigue: Secondary | ICD-10-CM | POA: Diagnosis not present

## 2023-01-04 DIAGNOSIS — Z5181 Encounter for therapeutic drug level monitoring: Secondary | ICD-10-CM

## 2023-01-04 DIAGNOSIS — Z8639 Personal history of other endocrine, nutritional and metabolic disease: Secondary | ICD-10-CM

## 2023-01-04 DIAGNOSIS — Z13820 Encounter for screening for osteoporosis: Secondary | ICD-10-CM

## 2023-01-04 DIAGNOSIS — F5101 Primary insomnia: Secondary | ICD-10-CM

## 2023-01-04 DIAGNOSIS — M797 Fibromyalgia: Secondary | ICD-10-CM

## 2023-01-04 DIAGNOSIS — E559 Vitamin D deficiency, unspecified: Secondary | ICD-10-CM

## 2023-01-04 DIAGNOSIS — M1711 Unilateral primary osteoarthritis, right knee: Secondary | ICD-10-CM

## 2023-01-04 DIAGNOSIS — Z8679 Personal history of other diseases of the circulatory system: Secondary | ICD-10-CM

## 2023-01-04 DIAGNOSIS — Z96652 Presence of left artificial knee joint: Secondary | ICD-10-CM

## 2023-01-04 DIAGNOSIS — M7062 Trochanteric bursitis, left hip: Secondary | ICD-10-CM

## 2023-01-04 NOTE — Patient Instructions (Signed)

## 2023-01-05 ENCOUNTER — Other Ambulatory Visit: Payer: Self-pay | Admitting: *Deleted

## 2023-01-05 LAB — DM TEMPLATE

## 2023-01-05 NOTE — Progress Notes (Signed)
Vitamin D is low at 15.  Patient should take vitamin D 50,000 units once a week for 3 months.  She should stay on vitamin D 2000 units after finishing the course of vitamin D.  Repeat vitamin D level in 3 months.  CBC and CMP are normal.

## 2023-01-06 LAB — COMPLETE METABOLIC PANEL WITH GFR
AG Ratio: 1.6 (calc) (ref 1.0–2.5)
ALT: 9 U/L (ref 6–29)
AST: 14 U/L (ref 10–35)
Albumin: 4.6 g/dL (ref 3.6–5.1)
Alkaline phosphatase (APISO): 71 U/L (ref 37–153)
BUN: 15 mg/dL (ref 7–25)
CO2: 27 mmol/L (ref 20–32)
Calcium: 10.2 mg/dL (ref 8.6–10.4)
Chloride: 105 mmol/L (ref 98–110)
Creat: 0.8 mg/dL (ref 0.50–1.05)
Globulin: 2.9 g/dL (calc) (ref 1.9–3.7)
Glucose, Bld: 104 mg/dL — ABNORMAL HIGH (ref 65–99)
Potassium: 4.6 mmol/L (ref 3.5–5.3)
Sodium: 140 mmol/L (ref 135–146)
Total Bilirubin: 0.5 mg/dL (ref 0.2–1.2)
Total Protein: 7.5 g/dL (ref 6.1–8.1)
eGFR: 83 mL/min/{1.73_m2} (ref >=60)

## 2023-01-06 LAB — VITAMIN D 25 HYDROXY (VIT D DEFICIENCY, FRACTURES): Vit D, 25-Hydroxy: 15 ng/mL — ABNORMAL LOW (ref 30–100)

## 2023-01-06 LAB — CBC WITH DIFFERENTIAL/PLATELET
Absolute Monocytes: 448 cells/uL (ref 200–950)
Basophils Absolute: 53 cells/uL (ref 0–200)
Basophils Relative: 0.7 %
Eosinophils Absolute: 289 cells/uL (ref 15–500)
Eosinophils Relative: 3.8 %
HCT: 43.8 % (ref 35.0–45.0)
Hemoglobin: 14.2 g/dL (ref 11.7–15.5)
Lymphs Abs: 2645 cells/uL (ref 850–3900)
MCH: 26.4 pg — ABNORMAL LOW (ref 27.0–33.0)
MCHC: 32.4 g/dL (ref 32.0–36.0)
MCV: 81.6 fL (ref 80.0–100.0)
MPV: 10.7 fL (ref 7.5–12.5)
Monocytes Relative: 5.9 %
Neutro Abs: 4165 cells/uL (ref 1500–7800)
Neutrophils Relative %: 54.8 %
Platelets: 256 10*3/uL (ref 140–400)
RBC: 5.37 10*6/uL — ABNORMAL HIGH (ref 3.80–5.10)
RDW: 12.3 % (ref 11.0–15.0)
Total Lymphocyte: 34.8 %
WBC: 7.6 10*3/uL (ref 3.8–10.8)

## 2023-01-06 LAB — DRUG MONITOR, PANEL 5, W/CONF, URINE
Amphetamines: NEGATIVE ng/mL (ref <500)
Barbiturates: NEGATIVE ng/mL (ref <300)
Benzodiazepines: NEGATIVE ng/mL (ref <100)
Cocaine Metabolite: NEGATIVE ng/mL (ref <150)
Creatinine: 170.9 mg/dL (ref >=20.0)
Marijuana Metabolite: NEGATIVE ng/mL (ref <20)
Methadone Metabolite: NEGATIVE ng/mL (ref <100)
Opiates: NEGATIVE ng/mL (ref <100)
Oxidant: NEGATIVE ug/mL (ref <200)
Oxycodone: NEGATIVE ng/mL (ref <100)
pH: 5.8 (ref 4.5–9.0)

## 2023-01-06 LAB — DRUG MONITOR, TRAMADOL,QN, URINE
Desmethyltramadol: 5241 ng/mL — ABNORMAL HIGH (ref <100)
Tramadol: >10000 ng/mL — ABNORMAL HIGH (ref <100)

## 2023-01-06 LAB — DM TEMPLATE

## 2023-01-06 MED ORDER — VITAMIN D (ERGOCALCIFEROL) 1.25 MG (50000 UNIT) PO CAPS: 50000.0000 [IU] | ORAL_CAPSULE | ORAL | 0 refills | Status: DC

## 2023-01-06 NOTE — Progress Notes (Signed)
UDS is consistent with tramadol use.

## 2023-01-14 ENCOUNTER — Other Ambulatory Visit: Payer: Self-pay | Admitting: Physician Assistant

## 2023-01-16 NOTE — Telephone Encounter (Signed)
Next Visit: 07/12/2023  Last Visit: 01/04/2023  Last Fill: 10/17/2022  DX: Fibromyalgia   Current Dose per office note 01/04/2023: Celebrex 200 mg 1 capsule daily for pain relief   Labs: 01/04/2023  CBC and CMP are normal.   Okay to refill Celebrex?

## 2023-02-12 ENCOUNTER — Other Ambulatory Visit: Payer: Self-pay | Admitting: Physician Assistant

## 2023-02-13 NOTE — Telephone Encounter (Signed)
Last Fill: 12/14/2022  UDS:01/04/2023  Narc Agreement: 01/04/2023  Next Visit: 07/12/2023  Last Visit: 01/04/2023  Dx:  Fibromyalgia   Current Dose per office note on 01/04/2023:tramadol 50 mg 1 tablet twice daily    Okay to refill Tramadol?

## 2023-03-26 ENCOUNTER — Other Ambulatory Visit: Payer: Self-pay | Admitting: Rheumatology

## 2023-03-28 NOTE — Telephone Encounter (Signed)
Last Fill: 02/13/2023  UDS:01/04/2023 c/w  Narc Agreement: 01/04/2023  Next Visit: 07/12/2023  Last Visit: 01/04/2023  Dx: Fibromyalgia   Current Dose per office note on on 01/04/2023:ramadol 50 mg 1 tablet twice daily    Okay to refill tramadol?

## 2023-04-22 ENCOUNTER — Other Ambulatory Visit: Payer: Self-pay | Admitting: Physician Assistant

## 2023-04-24 ENCOUNTER — Other Ambulatory Visit: Payer: Self-pay | Admitting: Rheumatology

## 2023-04-24 NOTE — Telephone Encounter (Signed)
Last Fill: 01/16/2023  Labs: 01/04/2023 CBC and CMP are normal.   Next Visit: 07/12/2023  Last Visit: 01/04/2023  DX: Fibromyalgia   Current Dose per office note 01/04/2023:  Celebrex 200 mg 1 capsule daily for pain relief   Okay to refill Celebrex?

## 2023-06-09 ENCOUNTER — Other Ambulatory Visit: Payer: Self-pay | Admitting: Rheumatology

## 2023-06-09 NOTE — Telephone Encounter (Signed)
Last Fill: 03/28/2023  UDS:01/04/2023 c/w   Narc Agreement:  01/04/2023   Next Visit: 07/12/2023  Last Visit: 01/04/2023  Dx: Fibromyalgia   Current Dose per office note on 01/04/2023:tramadol 50 mg 1 tablet twice daily    Okay to refill Tramadol?

## 2023-06-30 NOTE — Progress Notes (Deleted)
Office Visit Note  Patient: Marie [name]             Date of Birth: 11-29-1960           MRN: [id number]             PCP: Lupita Raider, MD Referring: Lupita Raider, MD Visit Date: 07/12/2023 Occupation: @GUAROCC @  Subjective:  No chief complaint on file.   History of Present Illness: Marie [name] is a 62 y.o. female ***     Activities of Daily Living:  Patient reports morning stiffness for *** {minute/hour:19697}.   Patient {ACTIONS;DENIES/REPORTS:21021675::"Denies"} nocturnal pain.  Difficulty dressing/grooming: {ACTIONS;DENIES/REPORTS:21021675::"Denies"} Difficulty climbing stairs: {ACTIONS;DENIES/REPORTS:21021675::"Denies"} Difficulty getting out of chair: {ACTIONS;DENIES/REPORTS:21021675::"Denies"} Difficulty using hands for taps, buttons, cutlery, and/or writing: {ACTIONS;DENIES/REPORTS:21021675::"Denies"}  No Rheumatology ROS completed.   PMFS History:  Patient Active Problem List   Diagnosis Date Noted   Fibromyalgia 01/02/2017   Other fatigue 01/02/2017   Primary insomnia 01/02/2017   Primary osteoarthritis of both knees 01/02/2017   History of diabetes mellitus 01/02/2017   History of hyperlipidemia 01/02/2017   Primary osteoarthritis of left knee 11/25/2014   Diabetes mellitus type 2 in obese 11/25/2014   Severe obesity (BMI >= 40) (HCC) 11/25/2014   Hypertension 11/25/2014   S/P total knee replacement using cement 11/25/2014   RUQ PAIN 03/14/2007    Past Medical History:  Diagnosis Date   Arthritis    knees, knees, neck, shoulders, hands    Diabetes mellitus without complication (HCC)    borderline, states she is using Victoza for weight loss, Rx by PCP   Fibromyalgia    followed by Dr. Corliss Skains   Hypertension     Family History  Problem Relation Age of Onset   Diabetes Mother    Cancer Mother    Diabetes Father    Multiple sclerosis Daughter    Past Surgical History:  Procedure Laterality Date   ABDOMINAL HYSTERECTOMY  2000    MYOMECTOMY     several surgeries for this problem   TONSILLECTOMY     TOTAL KNEE ARTHROPLASTY Left 11/25/2014   dr whitfield   TOTAL KNEE ARTHROPLASTY Left 11/25/2014   Procedure: TOTAL KNEE ARTHROPLASTY;  Surgeon: Valeria Batman, MD;  Location: [facility] OR;  Service: Orthopedics;  Laterality: Left;   Social History   Social History Narrative   Not on file   Immunization History  Administered Date(s) Administered   PFIZER(Purple Top)SARS-COV-2 Vaccination 03/07/2020, 03/28/2020, 10/27/2020     Objective: Vital Signs: There were no vitals taken for this visit.   Physical Exam   Musculoskeletal Exam: ***  CDAI Exam: CDAI Score: -- Patient Global: --; Provider Global: -- Swollen: --; Tender: -- Joint Exam 07/12/2023   No joint exam has been documented for this visit   There is currently no information documented on the homunculus. Go to the Rheumatology activity and complete the homunculus joint exam.  Investigation: No additional findings.  Imaging: No results found.  Recent Labs: Lab Results  Component Value Date   WBC 7.6 01/04/2023   HGB 14.2 01/04/2023   PLT 256 01/04/2023   NA 140 01/04/2023   K 4.6 01/04/2023   CL 105 01/04/2023   CO2 27 01/04/2023   GLUCOSE 104 (H) 01/04/2023   BUN 15 01/04/2023   CREATININE 0.80 01/04/2023   BILITOT 0.5 01/04/2023   ALKPHOS 50 01/10/2017   AST 14 01/04/2023   ALT 9 01/04/2023   PROT 7.5 01/04/2023   ALBUMIN 4.1 01/10/2017   CALCIUM  10.2 01/04/2023   GFRAA 104 12/29/2020    Speciality Comments: No specialty comments available.  Procedures:  No procedures performed Allergies: Patient has no known allergies.   Assessment / Plan:     Visit Diagnoses: No diagnosis found.  Orders: No orders of the defined types were placed in this encounter.  No orders of the defined types were placed in this encounter.   Face-to-face time spent with patient was *** minutes. Greater than 50% of time was spent in counseling  and coordination of care.  Follow-Up Instructions: No follow-ups on file.   Ellen Henri, CMA  Note - This record has been created using Animal nutritionist.  Chart creation errors have been sought, but may not always  have been located. Such creation errors do not reflect on  the standard of medical care.

## 2023-07-12 ENCOUNTER — Ambulatory Visit: Payer: 59 | Admitting: Rheumatology

## 2023-07-12 DIAGNOSIS — Z96652 Presence of left artificial knee joint: Secondary | ICD-10-CM

## 2023-07-12 DIAGNOSIS — Z8679 Personal history of other diseases of the circulatory system: Secondary | ICD-10-CM

## 2023-07-12 DIAGNOSIS — M1711 Unilateral primary osteoarthritis, right knee: Secondary | ICD-10-CM

## 2023-07-12 DIAGNOSIS — F5101 Primary insomnia: Secondary | ICD-10-CM

## 2023-07-12 DIAGNOSIS — M797 Fibromyalgia: Secondary | ICD-10-CM

## 2023-07-12 DIAGNOSIS — E559 Vitamin D deficiency, unspecified: Secondary | ICD-10-CM

## 2023-07-12 DIAGNOSIS — Z8639 Personal history of other endocrine, nutritional and metabolic disease: Secondary | ICD-10-CM

## 2023-07-12 DIAGNOSIS — M7061 Trochanteric bursitis, right hip: Secondary | ICD-10-CM

## 2023-07-12 DIAGNOSIS — R5383 Other fatigue: Secondary | ICD-10-CM

## 2023-07-12 DIAGNOSIS — Z13820 Encounter for screening for osteoporosis: Secondary | ICD-10-CM

## 2023-07-12 DIAGNOSIS — Z5181 Encounter for therapeutic drug level monitoring: Secondary | ICD-10-CM

## 2023-07-27 ENCOUNTER — Other Ambulatory Visit: Payer: Self-pay | Admitting: Physician Assistant

## 2023-07-27 NOTE — Telephone Encounter (Signed)
Last Fill: 04/24/2023  Labs: 01/04/2023 CBC and CMP are normal.   Next Visit: 08/14/2023  Last Visit: 01/04/2023   DX: Fibromyalgia   Current Dose per office note 01/04/2023: Celebrex 200 mg 1 capsule daily for pain relief   Patient advised she is due to update labs and for a follow up visit. Patient scheduled appointment for 08/14/2023 and will update labs at that time.   Okay to refill Celebrex?

## 2023-07-31 NOTE — Progress Notes (Signed)
Office Visit Note  Patient: Marie [name]             Date of Birth: 01-17-1961           MRN: [id number]             PCP: Lupita Raider, MD Referring: Lupita Raider, MD Visit Date: 08/14/2023 Occupation: @GUAROCC @  Subjective:  Medication monitoring   History of Present Illness: Marie [name] is a 62 y.o. female with history of fibromyalgia and osteoarthritis.  Patient continues to experience intermittent myalgias and muscle tenderness due to fibromyalgia.  She has noticed increased myalgias with weather changes.  She is not currently experiencing any muscle spasms.  She experiences nocturnal pain especially in her shoulders and hips when lying on her sides at night.  She denies any joint swelling at this time.  She continues to take Celebrex 200 mg 1 capsule daily and tramadol 50 mg 1 tablet twice daily for pain relief.   Activities of Daily Living:  Patient reports morning stiffness for 1 hour.   Patient Reports nocturnal pain.  Difficulty dressing/grooming: Denies Difficulty climbing stairs: Reports Difficulty getting out of chair: Denies Difficulty using hands for taps, buttons, cutlery, and/or writing: Denies  Review of Systems  Constitutional:  Positive for fatigue.  HENT:  Negative for mouth sores and mouth dryness.   Eyes:  Positive for dryness.  Respiratory:  Positive for shortness of breath.   Cardiovascular:  Negative for chest pain and palpitations.  Gastrointestinal:  Negative for blood in stool, constipation and diarrhea.  Endocrine: Negative for increased urination.  Genitourinary:  Negative for involuntary urination.  Musculoskeletal:  Positive for joint pain, gait problem, joint pain, joint swelling, myalgias, muscle weakness, morning stiffness, muscle tenderness and myalgias.  Skin:  Positive for hair loss. Negative for color change, rash and sensitivity to sunlight.  Allergic/Immunologic: Negative for susceptible to infections.  Neurological:   Negative for dizziness and headaches.  Hematological:  Negative for swollen glands.  Psychiatric/Behavioral:  Positive for sleep disturbance. Negative for depressed mood. The patient is not nervous/anxious.     PMFS History:  Patient Active Problem List   Diagnosis Date Noted   Fibromyalgia 01/02/2017   Other fatigue 01/02/2017   Primary insomnia 01/02/2017   Primary osteoarthritis of both knees 01/02/2017   History of diabetes mellitus 01/02/2017   History of hyperlipidemia 01/02/2017   Primary osteoarthritis of left knee 11/25/2014   Type 2 diabetes mellitus with obesity (HCC) 11/25/2014   Severe obesity (BMI >= 40) (HCC) 11/25/2014   Hypertension 11/25/2014   S/P total knee replacement using cement 11/25/2014   RUQ PAIN 03/14/2007    Past Medical History:  Diagnosis Date   Arthritis    knees, knees, neck, shoulders, hands    Diabetes mellitus without complication (HCC)    borderline, states she is using Victoza for weight loss, Rx by PCP   Fibromyalgia    followed by Dr. Corliss Skains   Hypertension     Family History  Problem Relation Age of Onset   Diabetes Mother    Cancer Mother    Diabetes Father    Multiple sclerosis Daughter    Past Surgical History:  Procedure Laterality Date   ABDOMINAL HYSTERECTOMY  2000   MYOMECTOMY     several surgeries for this problem   TONSILLECTOMY     TOTAL KNEE ARTHROPLASTY Left 11/25/2014   dr whitfield   TOTAL KNEE ARTHROPLASTY Left 11/25/2014   Procedure: TOTAL KNEE ARTHROPLASTY;  Surgeon:  Valeria Batman, MD;  Location: [facility] OR;  Service: Orthopedics;  Laterality: Left;   Social History   Social History Narrative   Not on file   Immunization History  Administered Date(s) Administered   PFIZER(Purple Top)SARS-COV-2 Vaccination 03/07/2020, 03/28/2020, 10/27/2020     Objective: Vital Signs: BP 132/81 (BP Location: Left Arm, Patient Position: Sitting, Cuff Size: Normal)   Pulse 75   Resp 15   Ht 5\' 6"  (1.676 m)   Wt 255  lb (115.7 kg)   BMI 41.16 kg/m    Physical Exam Vitals and nursing note reviewed.  Constitutional:      Appearance: She is well-developed.  HENT:     Head: Normocephalic and atraumatic.  Eyes:     Conjunctiva/sclera: Conjunctivae normal.  Cardiovascular:     Rate and Rhythm: Normal rate and regular rhythm.     Heart sounds: Normal heart sounds.  Pulmonary:     Effort: Pulmonary effort is normal.     Breath sounds: Normal breath sounds.  Abdominal:     General: Bowel sounds are normal.     Palpations: Abdomen is soft.  Musculoskeletal:     Cervical back: Normal range of motion.  Lymphadenopathy:     Cervical: No cervical adenopathy.  Skin:    General: Skin is warm and dry.     Capillary Refill: Capillary refill takes less than 2 seconds.  Neurological:     Mental Status: She is alert and oriented to person, place, and time.  Psychiatric:        Behavior: Behavior normal.      Musculoskeletal Exam: C-spine has limited ROM with lateral rotation. Limited ROM of the right shoulder.  Trapezius muscle tension and tenderness bilaterally.  Elbow joints, wrist joints, MCPs, PIPs, DIPs have good range of motion with no synovitis.  Complete fist formation bilaterally.  PIP and DIP thickening consistent with osteoarthritis of both hands.  Hip joints have good range of motion with no groin pain.  Tenderness over bilateral trochanteric bursa.  Left knee replacement has good range of motion with no effusion.  Right knee joint has good range of motion with no warmth or effusion.  Ankle joints have good range of motion with no tenderness or joint swelling.  CDAI Exam: CDAI Score: -- Patient Global: --; Provider Global: -- Swollen: --; Tender: -- Joint Exam 08/14/2023   No joint exam has been documented for this visit   There is currently no information documented on the homunculus. Go to the Rheumatology activity and complete the homunculus joint exam.  Investigation: No additional  findings.  Imaging: No results found.  Recent Labs: Lab Results  Component Value Date   WBC 7.6 01/04/2023   HGB 14.2 01/04/2023   PLT 256 01/04/2023   NA 140 01/04/2023   K 4.6 01/04/2023   CL 105 01/04/2023   CO2 27 01/04/2023   GLUCOSE 104 (H) 01/04/2023   BUN 15 01/04/2023   CREATININE 0.80 01/04/2023   BILITOT 0.5 01/04/2023   ALKPHOS 50 01/10/2017   AST 14 01/04/2023   ALT 9 01/04/2023   PROT 7.5 01/04/2023   ALBUMIN 4.1 01/10/2017   CALCIUM 10.2 01/04/2023   GFRAA 104 12/29/2020    Speciality Comments: No specialty comments available.  Procedures:  No procedures performed Allergies: Patient has no known allergies.   Assessment / Plan:     Visit Diagnoses: Fibromyalgia: Patient continues to experience intermittent myalgias and muscle tenderness due to fibromyalgia.  She has noticed some increased  myalgias with the weather changing.  Overall her symptoms have been manageable with the use of Celebrex 200 mg 1 capsule by mouth daily and tramadol 50 mg 1 tablet twice daily for pain relief.  She continues to tolerate both medications and has been taking Celebrex with food.  Plan to update CBC, CMP, and UDS today.  Narcotic agreement was also updated.  Discussed the importance of regular exercise and good sleep hygiene.  She was advised to notify us if she develops any new or worsening symptoms.  She will follow-up in the office in 6 months or sooner if needed.  Other fatigue: Stable. Discussed the importance of regular exercise and good sleep hygiene.  Primary insomnia: She continues to experience nocturnal pain which contributes to insomnia.    Medication monitoring encounter - Tramadol 50 mg 1 tablet by mouth twice daily as needed.  UDS and narcotic agreement were updated today.  CBC and CMP were also updated.  She does not require refills at this time but will have the pharmacy contact us when she is running low on her prescriptions.- Plan: CBC with  Differential/Platelet, COMPLETE METABOLIC PANEL WITH GFR, DRUG MONITOR, TRAMADOL,QN, URINE, DRUG MONITOR, PANEL 5, W/CONF, URINE  Primary osteoarthritis of right knee: Good range of motion of the right knee joint.  No warmth or effusion noted.  Status post total left knee replacement using cement: Doing well.  No warmth or effusion noted.  Trochanteric bursitis of both hips: Patient has ongoing tenderness over bilateral trochanteric bursa.  Encourage patient to perform stretching exercises daily.  She recently got a new mattress which will hopefully be helpful with nocturnal pain.  Vitamin D deficiency -Vitamin D was low at 15 on 01/04/2023.  Patient completed the prescription for vitamin D and has continued on a maintenance dose of vitamin D daily.  Plan to recheck vitamin D today.  Plan: VITAMIN D 25 Hydroxy (Vit-D Deficiency, Fractures)  Other medical conditions are listed as follows:  History of hypertension: Blood pressure was 132/81 today in the office.  History of diabetes mellitus  History of hyperlipidemia  Orders: Orders Placed This Encounter  Procedures   CBC with Differential/Platelet   COMPLETE METABOLIC PANEL WITH GFR   VITAMIN D 25 Hydroxy (Vit-D Deficiency, Fractures)   DRUG MONITOR, TRAMADOL,QN, URINE   DRUG MONITOR, PANEL 5, W/CONF, URINE   No orders of the defined types were placed in this encounter.   Follow-Up Instructions: Return in about 6 months (around 02/12/2024) for Fibromyalgia, Osteoarthritis.   Gearldine Bienenstock, PA-C  Note - This record has been created using Dragon software.  Chart creation errors have been sought, but may not always  have been located. Such creation errors do not reflect on  the standard of medical care.

## 2023-08-14 ENCOUNTER — Encounter: Payer: Self-pay | Admitting: Physician Assistant

## 2023-08-14 ENCOUNTER — Ambulatory Visit: Payer: 59 | Attending: Physician Assistant | Admitting: Physician Assistant

## 2023-08-14 VITALS — BP 132/81 | HR 75 | Resp 15 | Ht 66.0 in | Wt 255.0 lb

## 2023-08-14 DIAGNOSIS — E559 Vitamin D deficiency, unspecified: Secondary | ICD-10-CM

## 2023-08-14 DIAGNOSIS — M7061 Trochanteric bursitis, right hip: Secondary | ICD-10-CM

## 2023-08-14 DIAGNOSIS — Z96652 Presence of left artificial knee joint: Secondary | ICD-10-CM

## 2023-08-14 DIAGNOSIS — M1711 Unilateral primary osteoarthritis, right knee: Secondary | ICD-10-CM

## 2023-08-14 DIAGNOSIS — M797 Fibromyalgia: Secondary | ICD-10-CM | POA: Diagnosis not present

## 2023-08-14 DIAGNOSIS — Z5181 Encounter for therapeutic drug level monitoring: Secondary | ICD-10-CM

## 2023-08-14 DIAGNOSIS — R5383 Other fatigue: Secondary | ICD-10-CM | POA: Diagnosis not present

## 2023-08-14 DIAGNOSIS — Z8639 Personal history of other endocrine, nutritional and metabolic disease: Secondary | ICD-10-CM

## 2023-08-14 DIAGNOSIS — F5101 Primary insomnia: Secondary | ICD-10-CM

## 2023-08-14 DIAGNOSIS — M7062 Trochanteric bursitis, left hip: Secondary | ICD-10-CM

## 2023-08-14 DIAGNOSIS — Z8679 Personal history of other diseases of the circulatory system: Secondary | ICD-10-CM

## 2023-08-14 NOTE — Progress Notes (Signed)
CBC stable

## 2023-08-15 NOTE — Progress Notes (Signed)
Glucose is 143. Rest of CMP WNL.  Vitamin D WNL -continue maintenance dose of vitamin D.

## 2023-08-16 ENCOUNTER — Other Ambulatory Visit: Payer: Self-pay | Admitting: Rheumatology

## 2023-08-16 LAB — COMPLETE METABOLIC PANEL WITH GFR
AG Ratio: 1.7 (calc) (ref 1.0–2.5)
ALT: 10 U/L (ref 6–29)
AST: 13 U/L (ref 10–35)
Albumin: 4.3 g/dL (ref 3.6–5.1)
Alkaline phosphatase (APISO): 66 U/L (ref 37–153)
BUN: 19 mg/dL (ref 7–25)
CO2: 25 mmol/L (ref 20–32)
Calcium: 9.8 mg/dL (ref 8.6–10.4)
Chloride: 107 mmol/L (ref 98–110)
Creat: 0.79 mg/dL (ref 0.50–1.05)
Globulin: 2.6 g/dL (ref 1.9–3.7)
Glucose, Bld: 143 mg/dL — ABNORMAL HIGH (ref 65–99)
Potassium: 4.2 mmol/L (ref 3.5–5.3)
Sodium: 141 mmol/L (ref 135–146)
Total Bilirubin: 0.4 mg/dL (ref 0.2–1.2)
Total Protein: 6.9 g/dL (ref 6.1–8.1)
eGFR: 84 mL/min/{1.73_m2} (ref >=60)

## 2023-08-16 LAB — CBC WITH DIFFERENTIAL/PLATELET
Absolute Monocytes: 410 {cells}/uL (ref 200–950)
Basophils Absolute: 58 {cells}/uL (ref 0–200)
Basophils Relative: 0.8 %
Eosinophils Absolute: 317 {cells}/uL (ref 15–500)
Eosinophils Relative: 4.4 %
HCT: 42.4 % (ref 35.0–45.0)
Hemoglobin: 13.8 g/dL (ref 11.7–15.5)
Lymphs Abs: 2578 {cells}/uL (ref 850–3900)
MCH: 26.7 pg — ABNORMAL LOW (ref 27.0–33.0)
MCHC: 32.5 g/dL (ref 32.0–36.0)
MCV: 82.2 fL (ref 80.0–100.0)
MPV: 11 fL (ref 7.5–12.5)
Monocytes Relative: 5.7 %
Neutro Abs: 3838 {cells}/uL (ref 1500–7800)
Neutrophils Relative %: 53.3 %
Platelets: 239 10*3/uL (ref 140–400)
RBC: 5.16 10*6/uL — ABNORMAL HIGH (ref 3.80–5.10)
RDW: 12.3 % (ref 11.0–15.0)
Total Lymphocyte: 35.8 %
WBC: 7.2 10*3/uL (ref 3.8–10.8)

## 2023-08-16 LAB — DRUG MONITOR, PANEL 5, W/CONF, URINE
Amphetamines: NEGATIVE ng/mL (ref <500)
Barbiturates: NEGATIVE ng/mL (ref <300)
Benzodiazepines: NEGATIVE ng/mL (ref <100)
Cocaine Metabolite: NEGATIVE ng/mL (ref <150)
Creatinine: 235.4 mg/dL (ref >=20.0)
Marijuana Metabolite: NEGATIVE ng/mL (ref <20)
Methadone Metabolite: NEGATIVE ng/mL (ref <100)
Opiates: NEGATIVE ng/mL (ref <100)
Oxidant: NEGATIVE ug/mL (ref <200)
Oxycodone: NEGATIVE ng/mL (ref <100)
pH: 5.2 (ref 4.5–9.0)

## 2023-08-16 LAB — DRUG MONITOR, TRAMADOL,QN, URINE
Desmethyltramadol: >10000 ng/mL — ABNORMAL HIGH (ref <100)
Tramadol: >10000 ng/mL — ABNORMAL HIGH (ref <100)

## 2023-08-16 LAB — VITAMIN D 25 HYDROXY (VIT D DEFICIENCY, FRACTURES): Vit D, 25-Hydroxy: 34 ng/mL (ref 30–100)

## 2023-08-16 LAB — DM TEMPLATE

## 2023-08-16 NOTE — Telephone Encounter (Signed)
Last Fill: 06/09/2023  UDS:08/14/2023 (pending results)  Narc Agreement: 08/14/2023  Next Visit: 02/14/2024  Last Visit: 08/14/2023  Dx: Fibromyalgia   Current Dose per office note on 08/14/2023:tramadol 50 mg 1 tablet twice daily for pain relief.    Okay to refill Tramadol?

## 2023-08-16 NOTE — Progress Notes (Signed)
UDS is consistent with treatment.

## 2023-08-26 ENCOUNTER — Other Ambulatory Visit: Payer: Self-pay | Admitting: Rheumatology

## 2023-08-28 NOTE — Telephone Encounter (Signed)
Last Fill: 07/27/2023  Labs: 08/14/2023 Glucose is 143. Rest of CMP WNL.   Next Visit: 02/14/2024  Last Visit: 08/14/2023  DX:  Fibromyalgia   Current Dose per office note 08/14/2023: Celebrex 200 mg 1 capsule by mouth daily   Okay to refill Celebrex?

## 2023-10-17 ENCOUNTER — Other Ambulatory Visit: Payer: Self-pay | Admitting: Rheumatology

## 2023-10-17 NOTE — Telephone Encounter (Signed)
Last Fill: 08/16/2023   UDS:08/14/2023 UDS is consistent with treatment.    Narc Agreement: 08/14/2023   Next Visit: 02/14/2024   Last Visit: 08/14/2023   Dx: Fibromyalgia    Current Dose per office note on 08/14/2023:tramadol 50 mg 1 tablet twice daily for pain relief.      Okay to refill Tramadol?

## 2023-11-28 ENCOUNTER — Other Ambulatory Visit: Payer: Self-pay | Admitting: Rheumatology

## 2023-11-28 NOTE — Telephone Encounter (Signed)
Last Fill: 08/28/2023  Labs: 08/14/2023 Glucose is 143. Rest of CMP WNL. CBC stable.   Next Visit: 02/14/2024  Last Visit: 08/14/2023  DX: Fibromyalgia   Current Dose per office note 08/14/2023: Celebrex 200 mg 1 capsule by mouth daily   Okay to refill Celebrex?

## 2023-12-17 ENCOUNTER — Other Ambulatory Visit: Payer: Self-pay | Admitting: Rheumatology

## 2023-12-18 NOTE — Telephone Encounter (Signed)
 Last Fill: 10/17/2023   UDS:08/14/2023 UDS is consistent with treatment.    Narc Agreement: 08/14/2023   Next Visit: 02/14/2024   Last Visit: 08/14/2023   Dx: Fibromyalgia    Current Dose per office note on 08/14/2023:tramadol 50 mg 1 tablet twice daily for pain relief.      Okay to refill Tramadol?

## 2024-01-23 NOTE — Progress Notes (Unsigned)
 Office Visit Note  Patient: Marie [name]             Date of Birth: April 14, 1961           MRN: [id number]             PCP: Lupita Raider, MD Referring: Lupita Raider, MD Visit Date: 02/06/2024 Occupation: @GUAROCC @  Subjective:  Medication monitoring   History of Present Illness: Marie [name] is a 63 y.o. female with history of fibromyalgia and osteoarthritis.  Patient denies any new or worsening symptoms since her last office visit.  She continues to have occasional arthralgias and myalgias due to underlying osteoarthritis and fibromyalgia.  Overall her symptoms have been manageable with the use of Celebrex 200 mg 1 capsule by mouth daily as needed and tramadol 50 mg 1 to 2 tablets daily as needed for pain relief.  She does not need refills of either medication today but would notify us when she does.  Patient denies any new medical conditions.  She denies any recent hospitalizations.  She denies any recent or recurrent infections.     Activities of Daily Living:  Patient reports morning stiffness for 30-60 minutes.   Patient Reports nocturnal pain.  Difficulty dressing/grooming: Denies Difficulty climbing stairs: Reports Difficulty getting out of chair: Denies Difficulty using hands for taps, buttons, cutlery, and/or writing: Denies  Review of Systems  Constitutional:  Positive for fatigue.  HENT:  Negative for mouth sores, mouth dryness and nose dryness.   Eyes:  Positive for dryness. Negative for pain.  Respiratory:  Negative for shortness of breath and difficulty breathing.   Cardiovascular:  Negative for chest pain and palpitations.  Gastrointestinal:  Negative for blood in stool, constipation and diarrhea.  Endocrine: Negative for increased urination.  Genitourinary:  Negative for painful urination and involuntary urination.  Musculoskeletal:  Positive for joint pain, joint pain, myalgias, morning stiffness and myalgias. Negative for gait problem, joint  swelling, muscle weakness and muscle tenderness.  Skin:  Negative for color change, rash, hair loss and sensitivity to sunlight.  Allergic/Immunologic: Negative for susceptible to infections.  Neurological:  Negative for dizziness and headaches.  Hematological:  Negative for swollen glands.  Psychiatric/Behavioral:  Positive for sleep disturbance. Negative for depressed mood. The patient is not nervous/anxious.     PMFS History:  Patient Active Problem List   Diagnosis Date Noted   Fibromyalgia 01/02/2017   Other fatigue 01/02/2017   Primary insomnia 01/02/2017   Primary osteoarthritis of both knees 01/02/2017   History of diabetes mellitus 01/02/2017   History of hyperlipidemia 01/02/2017   Primary osteoarthritis of left knee 11/25/2014   Type 2 diabetes mellitus with obesity (HCC) 11/25/2014   Severe obesity (BMI >= 40) (HCC) 11/25/2014   Hypertension 11/25/2014   S/P total knee replacement using cement 11/25/2014   RUQ PAIN 03/14/2007    Past Medical History:  Diagnosis Date   Arthritis    knees, knees, neck, shoulders, hands    Diabetes mellitus without complication (HCC)    borderline, states she is using Victoza for weight loss, Rx by PCP   Fibromyalgia    followed by Dr. Corliss Skains   Hypertension     Family History  Problem Relation Age of Onset   Diabetes Mother    Cancer Mother    Diabetes Father    Multiple sclerosis Daughter    Past Surgical History:  Procedure Laterality Date   ABDOMINAL HYSTERECTOMY  2000   MYOMECTOMY     several  surgeries for this problem   TONSILLECTOMY     TOTAL KNEE ARTHROPLASTY Left 11/25/2014   dr whitfield   TOTAL KNEE ARTHROPLASTY Left 11/25/2014   Procedure: TOTAL KNEE ARTHROPLASTY;  Surgeon: Valeria Batman, MD;  Location: [facility] OR;  Service: Orthopedics;  Laterality: Left;   Social History   Social History Narrative   Not on file   Immunization History  Administered Date(s) Administered   PFIZER(Purple Top)SARS-COV-2  Vaccination 03/07/2020, 03/28/2020, 10/27/2020     Objective: Vital Signs: BP 127/84 (BP Location: Left Arm, Patient Position: Sitting, Cuff Size: Large)   Pulse 66   Resp 14   Ht 5\' 6"  (1.676 m)   Wt 256 lb 6.4 oz (116.3 kg)   BMI 41.38 kg/m    Physical Exam Vitals and nursing note reviewed.  Constitutional:      Appearance: She is well-developed.  HENT:     Head: Normocephalic and atraumatic.  Eyes:     Conjunctiva/sclera: Conjunctivae normal.  Cardiovascular:     Rate and Rhythm: Normal rate and regular rhythm.     Heart sounds: Normal heart sounds.  Pulmonary:     Effort: Pulmonary effort is normal.     Breath sounds: Normal breath sounds.  Abdominal:     General: Bowel sounds are normal.     Palpations: Abdomen is soft.  Musculoskeletal:     Cervical back: Normal range of motion.  Lymphadenopathy:     Cervical: No cervical adenopathy.  Skin:    General: Skin is warm and dry.     Capillary Refill: Capillary refill takes less than 2 seconds.  Neurological:     Mental Status: She is alert and oriented to person, place, and time.  Psychiatric:        Behavior: Behavior normal.      Musculoskeletal Exam: C-spine has limited range of motion with lateral rotation.  Trapezius muscle tension tenderness bilaterally.  Slightly limited range of motion of both shoulders.  Elbow joints, wrist joints, MCPs, PIPs, DIPs have good range of motion with no synovitis.  Complete fist formation bilaterally.  PIP and DIP thickening consistent with osteoarthritis of both hands.  Hip joints have good range of motion with no groin pain.  No tenderness over the trochanteric bursa currently.  Left knee replacement has good range of motion with no effusion.  Right knee joint has good range of motion with no warmth or effusion.  Ankle joints have good range of motion without tenderness or joint swelling.  CDAI Exam: CDAI Score: -- Patient Global: --; Provider Global: -- Swollen: --; Tender:  -- Joint Exam 02/06/2024   No joint exam has been documented for this visit   There is currently no information documented on the homunculus. Go to the Rheumatology activity and complete the homunculus joint exam.  Investigation: No additional findings.  Imaging: No results found.  Recent Labs: Lab Results  Component Value Date   WBC 7.2 08/14/2023   HGB 13.8 08/14/2023   PLT 239 08/14/2023   NA 141 08/14/2023   K 4.2 08/14/2023   CL 107 08/14/2023   CO2 25 08/14/2023   GLUCOSE 143 (H) 08/14/2023   BUN 19 08/14/2023   CREATININE 0.79 08/14/2023   BILITOT 0.4 08/14/2023   ALKPHOS 50 01/10/2017   AST 13 08/14/2023   ALT 10 08/14/2023   PROT 6.9 08/14/2023   ALBUMIN 4.1 01/10/2017   CALCIUM 9.8 08/14/2023   GFRAA 104 12/29/2020    Speciality Comments: No specialty comments available.  Procedures:  No procedures performed Allergies: Patient has no known allergies.   Assessment / Plan:     Visit Diagnoses: Fibromyalgia: Patient experiences intermittent myalgias and muscle tenderness due to fibromyalgia.  Overall her energy level has been stable and she has been sleeping well at night.  She has occasional nocturnal pain which interrupts her sleep but overall her symptoms have been manageable.  She has been taking Celebrex 200 mg 1 capsule daily as needed and tramadol 50 mg 1 to 2 tablets daily as needed for pain relief.  She does not need any refills at this time.  UDS and narcotic agreement were updated today along with CBC and CMP.  She was advised to notify us if she develops any new or worsening symptoms.  She will follow-up in the office in 6 months or sooner if needed.  Other fatigue: Chronic, stable.  Primary insomnia: Overall the patient has been sleeping well at night but she has occasional nocturnal pain which causes interrupted sleep at night.  Medication monitoring encounter - Tramadol 50 mg 1-2 tablets daily as needed and Celebrex 200 mg 1 capsule daily as  needed.  CBC and CMP updated today.  UDS and narcotic agreement were also updated today on 02/06/2024.- Plan: CBC with Differential/Platelet, Comprehensive metabolic panel with GFR, DRUG MONITOR, PANEL 5, W/CONF, URINE, DRUG MONITOR, TRAMADOL,QN, URINE  Status post total left knee replacement using cement: Doing well.  Good range of motion with no warmth or effusion.  Primary osteoarthritis of right knee: She experiences intermittent discomfort in the right knee joint.  She is not ready to proceed with surgical intervention at this time.  Discussed the importance of lower extremity muscle strengthening.  Patient has been taking Celebrex 200 mg 1 capsule daily as needed and tramadol 50 mg 1 to 2 tablets daily as needed for pain relief.  Trochanteric bursitis of both hips: Not currently symptomatic.  She experiences occasional nocturnal pain when laying on her sides at night requiring her to change positions throughout the night.  Discussed the importance of performing stretching exercises daily.  Other medical conditions are listed as follows:  Vitamin D deficiency: She is taking vitamin D supplement daily.  History of hypertension: Blood pressure was 127/84 today in the office.  History of diabetes mellitus  History of hyperlipidemia  Orders: Orders Placed This Encounter  Procedures   CBC with Differential/Platelet   Comprehensive metabolic panel with GFR   DRUG MONITOR, PANEL 5, W/CONF, URINE   DRUG MONITOR, TRAMADOL,QN, URINE   No orders of the defined types were placed in this encounter.    Follow-Up Instructions: Return in about 6 months (around 08/07/2024) for Fibromyalgia, Osteoarthritis.   Gearldine Bienenstock, PA-C  Note - This record has been created using Dragon software.  Chart creation errors have been sought, but may not always  have been located. Such creation errors do not reflect on  the standard of medical care.

## 2024-02-06 ENCOUNTER — Encounter: Payer: Self-pay | Admitting: Physician Assistant

## 2024-02-06 ENCOUNTER — Ambulatory Visit: Attending: Physician Assistant | Admitting: Physician Assistant

## 2024-02-06 VITALS — BP 127/84 | HR 66 | Resp 14 | Ht 66.0 in | Wt 256.4 lb

## 2024-02-06 DIAGNOSIS — M7062 Trochanteric bursitis, left hip: Secondary | ICD-10-CM

## 2024-02-06 DIAGNOSIS — M1711 Unilateral primary osteoarthritis, right knee: Secondary | ICD-10-CM

## 2024-02-06 DIAGNOSIS — R5383 Other fatigue: Secondary | ICD-10-CM | POA: Diagnosis not present

## 2024-02-06 DIAGNOSIS — M797 Fibromyalgia: Secondary | ICD-10-CM | POA: Diagnosis not present

## 2024-02-06 DIAGNOSIS — Z8639 Personal history of other endocrine, nutritional and metabolic disease: Secondary | ICD-10-CM

## 2024-02-06 DIAGNOSIS — F5101 Primary insomnia: Secondary | ICD-10-CM | POA: Diagnosis not present

## 2024-02-06 DIAGNOSIS — Z8679 Personal history of other diseases of the circulatory system: Secondary | ICD-10-CM

## 2024-02-06 DIAGNOSIS — Z5181 Encounter for therapeutic drug level monitoring: Secondary | ICD-10-CM | POA: Diagnosis not present

## 2024-02-06 DIAGNOSIS — E559 Vitamin D deficiency, unspecified: Secondary | ICD-10-CM

## 2024-02-06 DIAGNOSIS — Z96652 Presence of left artificial knee joint: Secondary | ICD-10-CM

## 2024-02-06 DIAGNOSIS — M7061 Trochanteric bursitis, right hip: Secondary | ICD-10-CM

## 2024-02-06 NOTE — Progress Notes (Signed)
 CBC WNL

## 2024-02-07 LAB — DM TEMPLATE

## 2024-02-07 NOTE — Progress Notes (Signed)
 Glucose is 137. Rest of CMP WNL

## 2024-02-08 LAB — DRUG MONITOR, PANEL 5, W/CONF, URINE
Amphetamines: NEGATIVE ng/mL (ref <500)
Barbiturates: NEGATIVE ng/mL (ref <300)
Benzodiazepines: NEGATIVE ng/mL (ref <100)
Cocaine Metabolite: NEGATIVE ng/mL (ref <150)
Creatinine: 177.4 mg/dL (ref >=20.0)
Marijuana Metabolite: NEGATIVE ng/mL (ref <20)
Methadone Metabolite: NEGATIVE ng/mL (ref <100)
Opiates: NEGATIVE ng/mL (ref <100)
Oxidant: NEGATIVE ug/mL (ref <200)
Oxycodone: NEGATIVE ng/mL (ref <100)
pH: 7 (ref 4.5–9.0)

## 2024-02-08 LAB — CBC WITH DIFFERENTIAL/PLATELET
Absolute Lymphocytes: 2904 {cells}/uL (ref 850–3900)
Absolute Monocytes: 497 {cells}/uL (ref 200–950)
Basophils Absolute: 64 {cells}/uL (ref 0–200)
Basophils Relative: 0.9 %
Eosinophils Absolute: 270 {cells}/uL (ref 15–500)
Eosinophils Relative: 3.8 %
HCT: 39.3 % (ref 35.0–45.0)
Hemoglobin: 13.1 g/dL (ref 11.7–15.5)
MCH: 26.7 pg — ABNORMAL LOW (ref 27.0–33.0)
MCHC: 33.3 g/dL (ref 32.0–36.0)
MCV: 80.2 fL (ref 80.0–100.0)
MPV: 11 fL (ref 7.5–12.5)
Monocytes Relative: 7 %
Neutro Abs: 3365 {cells}/uL (ref 1500–7800)
Neutrophils Relative %: 47.4 %
Platelets: 249 10*3/uL (ref 140–400)
RBC: 4.9 10*6/uL (ref 3.80–5.10)
RDW: 12.5 % (ref 11.0–15.0)
Total Lymphocyte: 40.9 %
WBC: 7.1 10*3/uL (ref 3.8–10.8)

## 2024-02-08 LAB — DM TEMPLATE

## 2024-02-08 LAB — DRUG MONITOR, TRAMADOL,QN, URINE
Desmethyltramadol: >10000 ng/mL — ABNORMAL HIGH (ref <100)
Tramadol: 7328 ng/mL — ABNORMAL HIGH (ref <100)

## 2024-02-08 LAB — COMPREHENSIVE METABOLIC PANEL WITH GFR
AG Ratio: 1.9 (calc) (ref 1.0–2.5)
ALT: 11 U/L (ref 6–29)
AST: 15 U/L (ref 10–35)
Albumin: 4.5 g/dL (ref 3.6–5.1)
Alkaline phosphatase (APISO): 59 U/L (ref 37–153)
BUN: 22 mg/dL (ref 7–25)
CO2: 31 mmol/L (ref 20–32)
Calcium: 9.8 mg/dL (ref 8.6–10.4)
Chloride: 104 mmol/L (ref 98–110)
Creat: 0.71 mg/dL (ref 0.50–1.05)
Globulin: 2.4 g/dL (ref 1.9–3.7)
Glucose, Bld: 137 mg/dL — ABNORMAL HIGH (ref 65–99)
Potassium: 5.2 mmol/L (ref 3.5–5.3)
Sodium: 140 mmol/L (ref 135–146)
Total Bilirubin: 0.4 mg/dL (ref 0.2–1.2)
Total Protein: 6.9 g/dL (ref 6.1–8.1)
eGFR: 95 mL/min/{1.73_m2} (ref >=60)

## 2024-02-08 NOTE — Progress Notes (Signed)
 UDS is consistent with treatment.

## 2024-02-14 ENCOUNTER — Ambulatory Visit: Payer: 59 | Admitting: Physician Assistant

## 2024-02-21 ENCOUNTER — Other Ambulatory Visit: Payer: Self-pay | Admitting: Rheumatology

## 2024-02-21 ENCOUNTER — Other Ambulatory Visit: Payer: Self-pay | Admitting: Physician Assistant

## 2024-02-21 NOTE — Telephone Encounter (Signed)
 Last Fill: 12/18/2023  UDS:02/06/2024  UDS is consistent with treatment   Narc Agreement: 02/06/2024  Next Visit: 08/07/2024  Last Visit: 02/06/2024  Dx: Fibromyalgia   Current Dose per office note on 02/06/2024:tramadol  50 mg 1 to 2 tablets daily as needed for pain relief    Okay to refill Tramadol ?

## 2024-02-21 NOTE — Telephone Encounter (Signed)
 Last Fill: 11/28/2023  Labs: 02/06/2024 Glucose is 137. Rest of CMP WNL   Next Visit: 08/07/2024  Last Visit: 02/06/2024  DX: Fibromyalgia   Current Dose per office note 02/06/2024: Celebrex  200 mg 1 capsule daily as needed   Okay to refill Celebrex ?

## 2024-04-07 ENCOUNTER — Other Ambulatory Visit: Payer: Self-pay | Admitting: Rheumatology

## 2024-05-06 ENCOUNTER — Other Ambulatory Visit: Payer: Self-pay | Admitting: Rheumatology

## 2024-05-07 NOTE — Telephone Encounter (Signed)
 Last Fill: 04/08/2024  UDS: 02/06/2024 UDS is consistent with treatment   Narc Agreement: 02/06/2024  Next Visit: 08/07/2024  Last Visit: 02/06/2024  Dx: Fibromyalgia   Current Dose per office note on 02/06/2024:tramadol  50 mg 1 to 2 tablets daily as needed for pain relief    Okay to refill Tramadol ?

## 2024-06-02 ENCOUNTER — Other Ambulatory Visit: Payer: Self-pay | Admitting: Rheumatology

## 2024-06-03 NOTE — Telephone Encounter (Signed)
 Last Fill: 02/21/2024  Labs: 02/06/2024 Glucose is 137. Rest of CMP WNL   Next Visit: 08/07/2024  Last Visit: 02/06/2024  DX: Fibromyalgia   Current Dose per office note 02/06/2024: Celebrex  200 mg 1 capsule daily as needed   Okay to refill Celebrex ?

## 2024-07-24 NOTE — Progress Notes (Signed)
 Office Visit Note  Patient: Marie [name]             Date of Birth: 15-Nov-1960           MRN: [id number]             PCP: Loreli Kins, MD Referring: Loreli Kins, MD Visit Date: 08/07/2024 Occupation: Data Unavailable  Subjective:  Medication monitoring  History of Present Illness: Marie [name] is a 63 y.o. female with fibromyalgia and osteoarthritis.  She returns today after her last visit in April 2025.  She states she has been doing the same with generalized aches and pains.  She states her symptoms are manageable with Celebrex  200 mg p.o. daily as needed and tramadol  50 mg 1 to 2 tablets p.o. twice daily as needed.  She continues to have some fatigue and insomnia.  Her left total knee replacement is doing well.  She has some intermittent discomfort in her right knee joint which is manageable with mid medications.  She has intermittent discomfort in the trochanteric region.    Activities of Daily Living:  Patient reports morning stiffness for 30 minutes.   Patient Reports nocturnal pain.  Difficulty dressing/grooming: Denies Difficulty climbing stairs: Reports Difficulty getting out of chair: Reports Difficulty using hands for taps, buttons, cutlery, and/or writing: Denies  Review of Systems  Constitutional:  Positive for fatigue.  HENT:  Negative for mouth sores and mouth dryness.   Eyes:  Negative for dryness.  Respiratory:  Negative for shortness of breath.   Cardiovascular:  Negative for chest pain and palpitations.  Gastrointestinal:  Negative for blood in stool, constipation and diarrhea.  Endocrine: Positive for increased urination.  Genitourinary:  Positive for involuntary urination.  Musculoskeletal:  Positive for joint pain, gait problem, joint pain, joint swelling, myalgias, muscle weakness, morning stiffness, muscle tenderness and myalgias.  Skin:  Positive for hair loss. Negative for color change, rash and sensitivity to sunlight.   Allergic/Immunologic: Negative for susceptible to infections.  Neurological:  Positive for headaches. Negative for dizziness.  Hematological:  Negative for swollen glands.  Psychiatric/Behavioral:  Positive for sleep disturbance. Negative for depressed mood. The patient is not nervous/anxious.     PMFS History:  Patient Active Problem List   Diagnosis Date Noted   Fibromyalgia 01/02/2017   Other fatigue 01/02/2017   Primary insomnia 01/02/2017   Primary osteoarthritis of both knees 01/02/2017   History of diabetes mellitus 01/02/2017   History of hyperlipidemia 01/02/2017   Primary osteoarthritis of left knee 11/25/2014   Type 2 diabetes mellitus with obesity 11/25/2014   Severe obesity (BMI >= 40) (HCC) 11/25/2014   Hypertension 11/25/2014   S/P total knee replacement using cement 11/25/2014   RUQ PAIN 03/14/2007    Past Medical History:  Diagnosis Date   Arthritis    knees, knees, neck, shoulders, hands    Diabetes mellitus without complication (HCC)    borderline, states she is using Victoza  for weight loss, Rx by PCP   Fibromyalgia    followed by Dr. Dolphus   Hypertension     Family History  Problem Relation Age of Onset   Diabetes Mother    Cancer Mother    Diabetes Father    Multiple sclerosis Daughter    Past Surgical History:  Procedure Laterality Date   ABDOMINAL HYSTERECTOMY  2000   MYOMECTOMY     several surgeries for this problem   TONSILLECTOMY     TOTAL KNEE ARTHROPLASTY Left 11/25/2014   dr  whitfield   TOTAL KNEE ARTHROPLASTY Left 11/25/2014   Procedure: TOTAL KNEE ARTHROPLASTY;  Surgeon: Maude LELON Right, MD;  Location: [facility] OR;  Service: Orthopedics;  Laterality: Left;   Social History   Tobacco Use   Smoking status: Never    Passive exposure: Past   Smokeless tobacco: Never  Vaping Use   Vaping status: Never Used  Substance Use Topics   Alcohol use: No   Drug use: No   Social History   Social History Narrative   Not on file      Immunization History  Administered Date(s) Administered   PFIZER(Purple Top)SARS-COV-2 Vaccination 03/07/2020, 03/28/2020, 10/27/2020     Objective: Vital Signs: BP 129/81   Pulse 71   Temp 97.6 F (36.4 C)   Resp 14   Ht 5' 6 (1.676 m)   Wt 264 lb 6.4 oz (119.9 kg)   BMI 42.68 kg/m    Physical Exam Vitals and nursing note reviewed.  Constitutional:      Appearance: She is well-developed.  HENT:     Head: Normocephalic and atraumatic.  Eyes:     Conjunctiva/sclera: Conjunctivae normal.  Cardiovascular:     Rate and Rhythm: Normal rate and regular rhythm.     Heart sounds: Normal heart sounds.  Pulmonary:     Effort: Pulmonary effort is normal.     Breath sounds: Normal breath sounds.  Abdominal:     General: Bowel sounds are normal.     Palpations: Abdomen is soft.  Musculoskeletal:     Cervical back: Normal range of motion.  Lymphadenopathy:     Cervical: No cervical adenopathy.  Skin:    General: Skin is warm and dry.     Capillary Refill: Capillary refill takes less than 2 seconds.  Neurological:     Mental Status: She is alert and oriented to person, place, and time.  Psychiatric:        Behavior: Behavior normal.      Musculoskeletal Exam: Cervical spine was in good range of motion.  She had limited range of motion of the lumbar spine which was difficult to assess due to body habitus.  Shoulders, elbows, wrist joints with good range of motion.  She had bilateral PIP and DIP thickening.  Hip joints and knee joints with good range of motion.  Left knee joint was replaced.  There was no tenderness over ankles or MTPs.  CDAI Exam: CDAI Score: -- Patient Global: --; Provider Global: -- Swollen: --; Tender: -- Joint Exam 08/07/2024   No joint exam has been documented for this visit   There is currently no information documented on the homunculus. Go to the Rheumatology activity and complete the homunculus joint exam.  Investigation: No additional  findings.  Imaging: No results found.  Recent Labs: Lab Results  Component Value Date   WBC 7.1 02/06/2024   HGB 13.1 02/06/2024   PLT 249 02/06/2024   NA 140 02/06/2024   K 5.2 02/06/2024   CL 104 02/06/2024   CO2 31 02/06/2024   GLUCOSE 137 (H) 02/06/2024   BUN 22 02/06/2024   CREATININE 0.71 02/06/2024   BILITOT 0.4 02/06/2024   ALKPHOS 50 01/10/2017   AST 15 02/06/2024   ALT 11 02/06/2024   PROT 6.9 02/06/2024   ALBUMIN 4.1 01/10/2017   CALCIUM 9.8 02/06/2024   GFRAA 104 12/29/2020    Speciality Comments: No specialty comments available.  Procedures:  No procedures performed Allergies: Patient has no known allergies.   Assessment /  Plan:     Visit Diagnoses: Fibromyalgia -she continues to have some generalized pain and discomfort.  She had positive tender points in generalized hyperalgesia.  Her symptoms are manageable with Celebrex  200 mg 1 capsule daily as needed and tramadol  50 mg 1 to 2 tablets daily as needed for pain relief.  February 06, 2024 CBC and CMP were normal.  Will recheck labs today.  Primary insomnia-sleep hygiene was discussed.  Other fatigue-related to insomnia and fibromyalgia.  Medication monitoring encounter - Tramadol  50 mg 1-2 tablets daily as needed and Celebrex  200 mg 1 capsule daily as needed. UDS & narcotic agreement: 02/06/2024 - Plan: DRUG MONITOR, PANEL 5, W/CONF, URINE, DRUG MONITOR, TRAMADOL ,QN, URINE, CBC with Differential/Platelet, Comprehensive metabolic panel with GFR  Status post total left knee replacement using cement-she had good range of motion without discomfort.  Primary osteoarthritis of right knee-history of discomfort.  She has noticed improvement in her knee pain since she is had intentional weight loss.  Trochanteric bursitis of both hips-she has intermittent discomfort in the trochanteric region.  Vitamin D  deficiency-she has been taking vitamin D .  Vitamin D  was normal last year.  History of hypertension-pressure was  normal at 129/81.  History of diabetes mellitus  History of hyperlipidemia-dietary modifications were discussed.  Orders: Orders Placed This Encounter  Procedures   DRUG MONITOR, PANEL 5, W/CONF, URINE   DRUG MONITOR, TRAMADOL ,QN, URINE   CBC with Differential/Platelet   Comprehensive metabolic panel with GFR   No orders of the defined types were placed in this encounter.   Follow-Up Instructions: Return in about 6 months (around 02/05/2025) for Osteoarthritis.   Maya Nash, MD  Note - This record has been created using Animal nutritionist.  Chart creation errors have been sought, but may not always  have been located. Such creation errors do not reflect on  the standard of medical care.

## 2024-08-07 ENCOUNTER — Encounter: Payer: Self-pay | Admitting: Rheumatology

## 2024-08-07 ENCOUNTER — Ambulatory Visit: Attending: Rheumatology | Admitting: Rheumatology

## 2024-08-07 VITALS — BP 129/81 | HR 71 | Temp 97.6°F | Resp 14 | Ht 66.0 in | Wt 264.4 lb

## 2024-08-07 DIAGNOSIS — R5383 Other fatigue: Secondary | ICD-10-CM | POA: Diagnosis not present

## 2024-08-07 DIAGNOSIS — E559 Vitamin D deficiency, unspecified: Secondary | ICD-10-CM

## 2024-08-07 DIAGNOSIS — Z5181 Encounter for therapeutic drug level monitoring: Secondary | ICD-10-CM | POA: Diagnosis not present

## 2024-08-07 DIAGNOSIS — Z8679 Personal history of other diseases of the circulatory system: Secondary | ICD-10-CM

## 2024-08-07 DIAGNOSIS — F5101 Primary insomnia: Secondary | ICD-10-CM

## 2024-08-07 DIAGNOSIS — M797 Fibromyalgia: Secondary | ICD-10-CM

## 2024-08-07 DIAGNOSIS — M7062 Trochanteric bursitis, left hip: Secondary | ICD-10-CM

## 2024-08-07 DIAGNOSIS — Z8639 Personal history of other endocrine, nutritional and metabolic disease: Secondary | ICD-10-CM

## 2024-08-07 DIAGNOSIS — M1711 Unilateral primary osteoarthritis, right knee: Secondary | ICD-10-CM

## 2024-08-07 DIAGNOSIS — Z96652 Presence of left artificial knee joint: Secondary | ICD-10-CM

## 2024-08-07 DIAGNOSIS — M7061 Trochanteric bursitis, right hip: Secondary | ICD-10-CM

## 2024-08-08 ENCOUNTER — Ambulatory Visit: Payer: Self-pay | Admitting: Rheumatology

## 2024-08-08 LAB — DM TEMPLATE

## 2024-08-08 NOTE — Progress Notes (Signed)
CBC and CMP are normal except glucose is mildly elevated, probably not a fasting sample.

## 2024-08-08 NOTE — Progress Notes (Signed)
 CBC normal, CMP shows mildly elevated glucose, probably not a fasting sample.  UDS shows interference with marijuana metabolite.  Please clarify with patient if she is using marijuana or any other over-the-counter substance for pain management.

## 2024-08-09 ENCOUNTER — Telehealth: Payer: Self-pay

## 2024-08-09 LAB — COMPREHENSIVE METABOLIC PANEL WITH GFR
AG Ratio: 1.5 (calc) (ref 1.0–2.5)
ALT: 10 U/L (ref 6–29)
AST: 13 U/L (ref 10–35)
Albumin: 4.3 g/dL (ref 3.6–5.1)
Alkaline phosphatase (APISO): 69 U/L (ref 37–153)
BUN: 20 mg/dL (ref 7–25)
CO2: 27 mmol/L (ref 20–32)
Calcium: 10 mg/dL (ref 8.6–10.4)
Chloride: 106 mmol/L (ref 98–110)
Creat: 0.82 mg/dL (ref 0.50–1.05)
Globulin: 2.8 g/dL (ref 1.9–3.7)
Glucose, Bld: 115 mg/dL — ABNORMAL HIGH (ref 65–99)
Potassium: 4.4 mmol/L (ref 3.5–5.3)
Sodium: 140 mmol/L (ref 135–146)
Total Bilirubin: 0.3 mg/dL (ref 0.2–1.2)
Total Protein: 7.1 g/dL (ref 6.1–8.1)
eGFR: 80 mL/min/1.73m2 (ref >=60)

## 2024-08-09 LAB — CBC WITH DIFFERENTIAL/PLATELET
Absolute Lymphocytes: 2390 {cells}/uL (ref 850–3900)
Absolute Monocytes: 454 {cells}/uL (ref 200–950)
Basophils Absolute: 41 {cells}/uL (ref 0–200)
Basophils Relative: 0.5 %
Eosinophils Absolute: 284 {cells}/uL (ref 15–500)
Eosinophils Relative: 3.5 %
HCT: 40.4 % (ref 35.0–45.0)
Hemoglobin: 13.4 g/dL (ref 11.7–15.5)
MCH: 27 pg (ref 27.0–33.0)
MCHC: 33.2 g/dL (ref 32.0–36.0)
MCV: 81.3 fL (ref 80.0–100.0)
MPV: 10.6 fL (ref 7.5–12.5)
Monocytes Relative: 5.6 %
Neutro Abs: 4933 {cells}/uL (ref 1500–7800)
Neutrophils Relative %: 60.9 %
Platelets: 245 Thousand/uL (ref 140–400)
RBC: 4.97 Million/uL (ref 3.80–5.10)
RDW: 12.7 % (ref 11.0–15.0)
Total Lymphocyte: 29.5 %
WBC: 8.1 Thousand/uL (ref 3.8–10.8)

## 2024-08-09 LAB — DRUG MONITOR, TRAMADOL,QN, URINE
Desmethyltramadol: >10000 ng/mL — ABNORMAL HIGH (ref <100)
Tramadol: >10000 ng/mL — ABNORMAL HIGH (ref <100)

## 2024-08-09 LAB — DRUG MONITOR, PANEL 5, W/CONF, URINE
Amphetamines: NEGATIVE ng/mL (ref <500)
Barbiturates: NEGATIVE ng/mL (ref <300)
Benzodiazepines: NEGATIVE ng/mL (ref <100)
Cocaine Metabolite: NEGATIVE ng/mL (ref <150)
Creatinine: >300 mg/dL (ref >=20.0)
Methadone Metabolite: NEGATIVE ng/mL (ref <100)
Opiates: NEGATIVE ng/mL (ref <100)
Oxidant: NEGATIVE ug/mL (ref <200)
Oxycodone: NEGATIVE ng/mL (ref <100)
pH: 5.9 (ref 4.5–9.0)

## 2024-08-09 LAB — DM TEMPLATE

## 2024-08-09 NOTE — Progress Notes (Signed)
 UDS suggestive of tramadol  use.

## 2024-08-09 NOTE — Telephone Encounter (Signed)
 Patient called in regards to UDS results.   08/07/2024: UDS shows interference with marijuana metabolite. Please clarify with patient if she is using marijuana or any other over-the-counter substance for pain management.   Patient states she has never used marijuana and does not take anything that is not prescribed to her besides tylenol .   Patient would like to repeat UDS if possible.

## 2024-08-09 NOTE — Telephone Encounter (Signed)
 No need to repeat UDS.

## 2024-08-12 NOTE — Telephone Encounter (Signed)
 Advised patient that per Dr. Dolphus: No need to repeat UDS.  Patient verbalized understanding.

## 2024-09-11 ENCOUNTER — Other Ambulatory Visit: Payer: Self-pay | Admitting: Rheumatology

## 2024-09-12 NOTE — Telephone Encounter (Signed)
 Last Fill: 05/07/2024  UDS:08/07/2024 UDS suggestive of tramadol  use.   Narc Agreement: 08/07/2024  Next Visit: 02/05/2025  Last Visit: 08/07/2024  Dx: Fibromyalgia   Current Dose per office note on 08/07/2024:Tramadol  50 mg 1-2 tablets daily as needed    Okay to refill Tramadol ?

## 2024-11-21 ENCOUNTER — Other Ambulatory Visit: Payer: Self-pay | Admitting: Physician Assistant

## 2024-11-21 NOTE — Telephone Encounter (Signed)
 Last Fill: 09/12/2024  UDS:08/07/2024 UDS suggestive of tramadol  use.   Narc Agreement: 08/07/2024  Next Visit: 02/05/2025  Last Visit: 08/07/2024  Dx:  Fibromyalgia   Current Dose per office note on 08/07/2024: Tramadol  50 mg 1-2 tablets daily as needed    Okay to refill Tramadol ?

## 2025-02-05 ENCOUNTER — Ambulatory Visit: Admitting: Physician Assistant
